# Patient Record
Sex: Female | Born: 1955 | Race: Black or African American | Hispanic: No | Smoking: Former smoker
Health system: Southern US, Community
[De-identification: ages and names within clinical notes are randomized; demographics above are authoritative.]

## PROBLEM LIST (undated history)

## (undated) DIAGNOSIS — E119 Type 2 diabetes mellitus without complications: Secondary | ICD-10-CM

## (undated) DIAGNOSIS — I639 Cerebral infarction, unspecified: Secondary | ICD-10-CM

## (undated) DIAGNOSIS — I10 Essential (primary) hypertension: Secondary | ICD-10-CM

## (undated) DIAGNOSIS — F329 Major depressive disorder, single episode, unspecified: Secondary | ICD-10-CM

## (undated) DIAGNOSIS — T3111 Burns involving 10-19% of body surface with 10-19% third degree burns: Secondary | ICD-10-CM

## (undated) DIAGNOSIS — Z452 Encounter for adjustment and management of vascular access device: Secondary | ICD-10-CM

## (undated) DIAGNOSIS — R410 Disorientation, unspecified: Secondary | ICD-10-CM

---

## 2002-02-11 ENCOUNTER — Emergency Department (HOSPITAL_COMMUNITY): Admission: EM | Admit: 2002-02-11 | Discharge: 2002-02-11 | Payer: Self-pay

## 2012-10-13 ENCOUNTER — Ambulatory Visit: Payer: Self-pay | Admitting: Family Medicine

## 2013-05-31 HISTORY — PX: SKIN GRAFT: SHX250

## 2013-12-26 ENCOUNTER — Emergency Department: Payer: Self-pay | Admitting: Emergency Medicine

## 2013-12-26 LAB — CBC
HCT: 42.5 % (ref 35.0–47.0)
HGB: 14 g/dL (ref 12.0–16.0)
MCH: 29.6 pg (ref 26.0–34.0)
MCHC: 33 g/dL (ref 32.0–36.0)
MCV: 90 fL (ref 80–100)
PLATELETS: 216 10*3/uL (ref 150–440)
RBC: 4.75 10*6/uL (ref 3.80–5.20)
RDW: 14.4 % (ref 11.5–14.5)
WBC: 11.1 10*3/uL — ABNORMAL HIGH (ref 3.6–11.0)

## 2013-12-26 LAB — COMPREHENSIVE METABOLIC PANEL
ALK PHOS: 60 U/L
Albumin: 3.6 g/dL (ref 3.4–5.0)
Anion Gap: 7 (ref 7–16)
BILIRUBIN TOTAL: 0.5 mg/dL (ref 0.2–1.0)
BUN: 15 mg/dL (ref 7–18)
Calcium, Total: 9 mg/dL (ref 8.5–10.1)
Chloride: 106 mmol/L (ref 98–107)
Co2: 25 mmol/L (ref 21–32)
Creatinine: 1.12 mg/dL (ref 0.60–1.30)
EGFR (Non-African Amer.): 55 — ABNORMAL LOW
Glucose: 146 mg/dL — ABNORMAL HIGH (ref 65–99)
Osmolality: 279 (ref 275–301)
Potassium: 3.4 mmol/L — ABNORMAL LOW (ref 3.5–5.1)
SGOT(AST): 15 U/L (ref 15–37)
SGPT (ALT): 20 U/L
SODIUM: 138 mmol/L (ref 136–145)
Total Protein: 7.5 g/dL (ref 6.4–8.2)

## 2013-12-28 DIAGNOSIS — I10 Essential (primary) hypertension: Secondary | ICD-10-CM | POA: Diagnosis present

## 2013-12-28 DIAGNOSIS — T3110 Burns involving 10-19% of body surface with 0% to 9% third degree burns: Secondary | ICD-10-CM | POA: Insufficient documentation

## 2015-07-09 ENCOUNTER — Other Ambulatory Visit: Payer: Self-pay | Admitting: Cardiology

## 2015-07-09 DIAGNOSIS — R079 Chest pain, unspecified: Secondary | ICD-10-CM

## 2015-07-16 ENCOUNTER — Ambulatory Visit
Admission: RE | Admit: 2015-07-16 | Discharge: 2015-07-16 | Disposition: A | Discharge status: Home / Self Care | Payer: Medicare Other | Source: Ambulatory Visit | Attending: Cardiology | Admitting: Cardiology

## 2015-07-16 DIAGNOSIS — R079 Chest pain, unspecified: Secondary | ICD-10-CM

## 2015-07-18 ENCOUNTER — Other Ambulatory Visit: Payer: Self-pay | Admitting: Cardiology

## 2015-07-18 DIAGNOSIS — R0602 Shortness of breath: Secondary | ICD-10-CM

## 2015-07-30 ENCOUNTER — Ambulatory Visit
Admission: RE | Admit: 2015-07-30 | Discharge: 2015-07-30 | Disposition: A | Discharge status: Home / Self Care | Payer: Medicare Other | Source: Ambulatory Visit | Attending: Cardiology | Admitting: Cardiology

## 2015-07-30 ENCOUNTER — Encounter
Admission: RE | Admit: 2015-07-30 | Discharge: 2015-07-30 | Disposition: A | Discharge status: Home / Self Care | Payer: Medicare Other | Source: Ambulatory Visit | Attending: Cardiology | Admitting: Cardiology

## 2015-07-30 DIAGNOSIS — R0602 Shortness of breath: Secondary | ICD-10-CM

## 2015-07-30 DIAGNOSIS — R0789 Other chest pain: Secondary | ICD-10-CM | POA: Diagnosis not present

## 2015-07-30 MED ORDER — TECHNETIUM TC 99M SESTAMIBI - CARDIOLITE
13.5690 | Freq: Once | INTRAVENOUS | Status: AC | PRN
  Administered 2015-07-30: 10:00:00 13.569 via INTRAVENOUS

## 2015-07-30 MED ORDER — REGADENOSON 0.4 MG/5ML IV SOLN
0.4000 mg | Freq: Once | INTRAVENOUS | Status: AC
  Administered 2015-07-30: 0.4 mg via INTRAVENOUS

## 2015-07-30 MED ORDER — TECHNETIUM TC 99M SESTAMIBI - CARDIOLITE
29.6670 | Freq: Once | INTRAVENOUS | Status: AC | PRN
  Administered 2015-07-30: 11:00:00 29.667 via INTRAVENOUS

## 2015-07-30 NOTE — Progress Notes (Signed)
*  PRELIMINARY RESULTS* Echocardiogram 2D Echocardiogram has been performed.  Georgann Housekeeper Hege 07/30/2015, 11:39 AM

## 2015-07-31 LAB — NM MYOCAR MULTI W/SPECT W/WALL MOTION / EF
CHL CUP STRESS STAGE 1 HR: 66 {beats}/min
CHL CUP STRESS STAGE 1 SPEED: 0 mph
CHL CUP STRESS STAGE 2 GRADE: 0 %
CHL CUP STRESS STAGE 4 SPEED: 0 mph
CHL CUP STRESS STAGE 5 HR: 106 {beats}/min
CSEPPHR: 117 {beats}/min
Estimated workload: 1 METS
LV sys vol: 5 mL
LVDIAVOL: 15 mL
NUC STRESS TID: 0.61
Percent of predicted max HR: 71 %
SDS: 1
SRS: 2
SSS: 1
Stage 1 Grade: 0 %
Stage 2 HR: 66 {beats}/min
Stage 2 Speed: 0 mph
Stage 3 Grade: 0 %
Stage 3 HR: 117 {beats}/min
Stage 3 Speed: 0 mph
Stage 4 Grade: 0 %
Stage 4 HR: 113 {beats}/min
Stage 5 DBP: 58 mmHg
Stage 5 Grade: 0 %
Stage 5 SBP: 128 mmHg
Stage 5 Speed: 0 mph

## 2017-04-10 ENCOUNTER — Emergency Department: Payer: Medicare Other

## 2017-04-10 ENCOUNTER — Other Ambulatory Visit: Payer: Self-pay

## 2017-04-10 ENCOUNTER — Inpatient Hospital Stay
Admission: EM | Admit: 2017-04-10 | Discharge: 2017-04-14 | DRG: 871 | Disposition: A | Discharge status: Skilled Nursing Facility | Payer: Medicare Other | Attending: Internal Medicine | Admitting: Internal Medicine

## 2017-04-10 ENCOUNTER — Encounter: Payer: Self-pay | Admitting: Adult Health

## 2017-04-10 DIAGNOSIS — Z79899 Other long term (current) drug therapy: Secondary | ICD-10-CM | POA: Diagnosis not present

## 2017-04-10 DIAGNOSIS — R6520 Severe sepsis without septic shock: Secondary | ICD-10-CM | POA: Diagnosis not present

## 2017-04-10 DIAGNOSIS — Z7982 Long term (current) use of aspirin: Secondary | ICD-10-CM

## 2017-04-10 DIAGNOSIS — R6521 Severe sepsis with septic shock: Secondary | ICD-10-CM | POA: Diagnosis present

## 2017-04-10 DIAGNOSIS — F329 Major depressive disorder, single episode, unspecified: Secondary | ICD-10-CM | POA: Diagnosis present

## 2017-04-10 DIAGNOSIS — E872 Acidosis, unspecified: Secondary | ICD-10-CM

## 2017-04-10 DIAGNOSIS — E785 Hyperlipidemia, unspecified: Secondary | ICD-10-CM | POA: Diagnosis present

## 2017-04-10 DIAGNOSIS — I10 Essential (primary) hypertension: Secondary | ICD-10-CM | POA: Diagnosis present

## 2017-04-10 DIAGNOSIS — Z6837 Body mass index (BMI) 37.0-37.9, adult: Secondary | ICD-10-CM | POA: Diagnosis not present

## 2017-04-10 DIAGNOSIS — R5383 Other fatigue: Secondary | ICD-10-CM

## 2017-04-10 DIAGNOSIS — E119 Type 2 diabetes mellitus without complications: Secondary | ICD-10-CM | POA: Diagnosis present

## 2017-04-10 DIAGNOSIS — R7881 Bacteremia: Secondary | ICD-10-CM | POA: Diagnosis not present

## 2017-04-10 DIAGNOSIS — E669 Obesity, unspecified: Secondary | ICD-10-CM | POA: Diagnosis present

## 2017-04-10 DIAGNOSIS — G9341 Metabolic encephalopathy: Secondary | ICD-10-CM | POA: Diagnosis present

## 2017-04-10 DIAGNOSIS — N390 Urinary tract infection, site not specified: Secondary | ICD-10-CM

## 2017-04-10 DIAGNOSIS — N179 Acute kidney failure, unspecified: Secondary | ICD-10-CM | POA: Diagnosis present

## 2017-04-10 DIAGNOSIS — G40909 Epilepsy, unspecified, not intractable, without status epilepticus: Secondary | ICD-10-CM | POA: Diagnosis present

## 2017-04-10 DIAGNOSIS — Z8673 Personal history of transient ischemic attack (TIA), and cerebral infarction without residual deficits: Secondary | ICD-10-CM

## 2017-04-10 DIAGNOSIS — Z7984 Long term (current) use of oral hypoglycemic drugs: Secondary | ICD-10-CM | POA: Diagnosis not present

## 2017-04-10 DIAGNOSIS — A419 Sepsis, unspecified organism: Principal | ICD-10-CM | POA: Diagnosis present

## 2017-04-10 DIAGNOSIS — I2510 Atherosclerotic heart disease of native coronary artery without angina pectoris: Secondary | ICD-10-CM | POA: Diagnosis present

## 2017-04-10 HISTORY — DX: Cerebral infarction, unspecified: I63.9

## 2017-04-10 HISTORY — DX: Type 2 diabetes mellitus without complications: E11.9

## 2017-04-10 HISTORY — DX: Burns involving 10-19% of body surface with 10-19% third degree burns: T31.11

## 2017-04-10 HISTORY — DX: Disorientation, unspecified: R41.0

## 2017-04-10 HISTORY — DX: Essential (primary) hypertension: I10

## 2017-04-10 HISTORY — DX: Major depressive disorder, single episode, unspecified: F32.9

## 2017-04-10 LAB — PROTIME-INR
INR: 1.11
PROTHROMBIN TIME: 14.2 s (ref 11.4–15.2)

## 2017-04-10 LAB — URINALYSIS, ROUTINE W REFLEX MICROSCOPIC
Glucose, UA: NEGATIVE mg/dL
KETONES UR: NEGATIVE mg/dL
Nitrite: NEGATIVE
PROTEIN: 100 mg/dL — AB
Specific Gravity, Urine: 1.035 — ABNORMAL HIGH (ref 1.005–1.030)
pH: 5 (ref 5.0–8.0)

## 2017-04-10 LAB — GLUCOSE, CAPILLARY
GLUCOSE-CAPILLARY: 116 mg/dL — AB (ref 65–99)
Glucose-Capillary: 96 mg/dL (ref 65–99)
Glucose-Capillary: 97 mg/dL (ref 65–99)

## 2017-04-10 LAB — COMPREHENSIVE METABOLIC PANEL
ALBUMIN: 2.8 g/dL — AB (ref 3.5–5.0)
ALT: 22 U/L (ref 14–54)
ANION GAP: 8 (ref 5–15)
AST: 71 U/L — AB (ref 15–41)
Alkaline Phosphatase: 70 U/L (ref 38–126)
BILIRUBIN TOTAL: 2.6 mg/dL — AB (ref 0.3–1.2)
BUN: 22 mg/dL — AB (ref 6–20)
CHLORIDE: 113 mmol/L — AB (ref 101–111)
CO2: 25 mmol/L (ref 22–32)
Calcium: 8.9 mg/dL (ref 8.9–10.3)
Creatinine, Ser: 1.24 mg/dL — ABNORMAL HIGH (ref 0.44–1.00)
GFR calc Af Amer: 54 mL/min — ABNORMAL LOW (ref >60)
GFR calc non Af Amer: 47 mL/min — ABNORMAL LOW (ref >60)
GLUCOSE: 105 mg/dL — AB (ref 65–99)
POTASSIUM: 4.3 mmol/L (ref 3.5–5.1)
SODIUM: 146 mmol/L — AB (ref 135–145)
Total Protein: 8.8 g/dL — ABNORMAL HIGH (ref 6.5–8.1)

## 2017-04-10 LAB — CBC WITH DIFFERENTIAL/PLATELET
BAND NEUTROPHILS: 12 %
BASOS PCT: 0 %
Basophils Absolute: 0 10*3/uL (ref 0–0.1)
Blasts: 0 %
EOS PCT: 0 %
Eosinophils Absolute: 0 10*3/uL (ref 0–0.7)
HEMATOCRIT: 48.3 % — AB (ref 35.0–47.0)
HEMOGLOBIN: 15.5 g/dL (ref 12.0–16.0)
LYMPHS ABS: 7.9 10*3/uL — AB (ref 1.0–3.6)
LYMPHS PCT: 54 %
MCH: 32.4 pg (ref 26.0–34.0)
MCHC: 32.2 g/dL (ref 32.0–36.0)
MCV: 100.5 fL — ABNORMAL HIGH (ref 80.0–100.0)
METAMYELOCYTES PCT: 0 %
MONOS PCT: 13 %
Monocytes Absolute: 1.9 10*3/uL — ABNORMAL HIGH (ref 0.2–0.9)
Myelocytes: 0 %
NEUTROS ABS: 4.9 10*3/uL (ref 1.4–6.5)
Neutrophils Relative %: 21 %
OTHER: 0 %
Platelets: 239 10*3/uL (ref 150–440)
Promyelocytes Absolute: 0 %
RBC: 4.81 MIL/uL (ref 3.80–5.20)
RDW: 17 % — ABNORMAL HIGH (ref 11.5–14.5)
WBC: 14.7 10*3/uL — ABNORMAL HIGH (ref 3.6–11.0)
nRBC: 0 /100 WBC

## 2017-04-10 LAB — BLOOD GAS, VENOUS
Acid-base deficit: 10.6 mmol/L — ABNORMAL HIGH (ref 0.0–2.0)
Bicarbonate: 14.4 mmol/L — ABNORMAL LOW (ref 20.0–28.0)
O2 Saturation: 61.3 %
PATIENT TEMPERATURE: 37
PCO2 VEN: 28 mmHg — AB (ref 44.0–60.0)
PH VEN: 7.32 (ref 7.250–7.430)
PO2 VEN: 35 mmHg (ref 32.0–45.0)

## 2017-04-10 LAB — VALPROIC ACID LEVEL: Valproic Acid Lvl: 72 ug/mL (ref 50.0–100.0)

## 2017-04-10 LAB — LIPASE, BLOOD: LIPASE: 28 U/L (ref 11–51)

## 2017-04-10 LAB — TROPONIN I: Troponin I: <0.03 ng/mL (ref <0.03)

## 2017-04-10 LAB — TSH: TSH: 1.994 u[IU]/mL (ref 0.350–4.500)

## 2017-04-10 LAB — PROCALCITONIN: Procalcitonin: <0.1 ng/mL

## 2017-04-10 LAB — LACTIC ACID, PLASMA
LACTIC ACID, VENOUS: 2.8 mmol/L — AB (ref 0.5–1.9)
LACTIC ACID, VENOUS: 2.1 mmol/L — AB (ref 0.5–1.9)

## 2017-04-10 LAB — MRSA PCR SCREENING: MRSA by PCR: NEGATIVE

## 2017-04-10 MED ORDER — DULOXETINE HCL 20 MG PO CPEP
40.0000 mg | ORAL_CAPSULE | Freq: Every day | ORAL | Status: DC
Start: 2017-04-11 — End: 2017-04-14
  Administered 2017-04-11 – 2017-04-13 (×3): 40 mg via ORAL
  Filled 2017-04-10 (×4): qty 2

## 2017-04-10 MED ORDER — IPRATROPIUM-ALBUTEROL 0.5-2.5 (3) MG/3ML IN SOLN
3.0000 mL | RESPIRATORY_TRACT | Status: DC
  Administered 2017-04-10 – 2017-04-11 (×6): 3 mL via RESPIRATORY_TRACT
  Filled 2017-04-10 (×5): qty 3
  Filled 2017-04-10: qty 42

## 2017-04-10 MED ORDER — SODIUM CHLORIDE 0.9 % IV BOLUS (SEPSIS)
1000.0000 mL | Freq: Once | INTRAVENOUS | Status: AC
  Administered 2017-04-10: 1000 mL via INTRAVENOUS

## 2017-04-10 MED ORDER — ORAL CARE MOUTH RINSE
15.0000 mL | Freq: Two times a day (BID) | OROMUCOSAL | Status: DC
  Administered 2017-04-11 – 2017-04-12 (×4): 15 mL via OROMUCOSAL

## 2017-04-10 MED ORDER — ONDANSETRON HCL 4 MG/2ML IJ SOLN: 4.0000 mg | Freq: Four times a day (QID) | INTRAMUSCULAR | Status: DC | PRN

## 2017-04-10 MED ORDER — MAGNESIUM HYDROXIDE 400 MG/5ML PO SUSP
30.0000 mL | Freq: Every day | ORAL | Status: DC | PRN
  Filled 2017-04-10: qty 30

## 2017-04-10 MED ORDER — SODIUM CHLORIDE 0.9 % IV SOLN
INTRAVENOUS | Status: DC
  Administered 2017-04-10 – 2017-04-11 (×2): via INTRAVENOUS

## 2017-04-10 MED ORDER — ADULT MULTIVITAMIN W/MINERALS CH
1.0000 | ORAL_TABLET | Freq: Every day | ORAL | Status: DC
  Administered 2017-04-11 – 2017-04-14 (×4): 1 via ORAL
  Filled 2017-04-10 (×4): qty 1

## 2017-04-10 MED ORDER — VANCOMYCIN HCL IN DEXTROSE 1-5 GM/200ML-% IV SOLN
1000.0000 mg | Freq: Once | INTRAVENOUS | Status: AC
  Administered 2017-04-10: 1000 mg via INTRAVENOUS
  Filled 2017-04-10: qty 200

## 2017-04-10 MED ORDER — ACETAMINOPHEN 325 MG PO TABS: 650.0000 mg | ORAL_TABLET | Freq: Four times a day (QID) | ORAL | Status: DC | PRN

## 2017-04-10 MED ORDER — ISOSORBIDE MONONITRATE ER 30 MG PO TB24
30.0000 mg | ORAL_TABLET | Freq: Every day | ORAL | Status: DC
  Administered 2017-04-12 – 2017-04-13 (×2): 30 mg via ORAL
  Filled 2017-04-10 (×4): qty 1

## 2017-04-10 MED ORDER — GABAPENTIN 300 MG PO CAPS
300.0000 mg | ORAL_CAPSULE | Freq: Three times a day (TID) | ORAL | Status: DC
  Administered 2017-04-11 – 2017-04-14 (×10): 300 mg via ORAL
  Filled 2017-04-10 (×11): qty 1

## 2017-04-10 MED ORDER — PIPERACILLIN-TAZOBACTAM 3.375 G IVPB
3.3750 g | Freq: Three times a day (TID) | INTRAVENOUS | Status: DC
  Administered 2017-04-10 – 2017-04-12 (×5): 3.375 g via INTRAVENOUS
  Filled 2017-04-10 (×5): qty 50

## 2017-04-10 MED ORDER — DIVALPROEX SODIUM 125 MG PO CSDR
375.0000 mg | DELAYED_RELEASE_CAPSULE | Freq: Two times a day (BID) | ORAL | Status: DC
  Administered 2017-04-11 – 2017-04-14 (×7): 375 mg via ORAL
  Filled 2017-04-10 (×8): qty 3

## 2017-04-10 MED ORDER — DOCUSATE SODIUM 100 MG PO CAPS
100.0000 mg | ORAL_CAPSULE | Freq: Two times a day (BID) | ORAL | Status: DC
Start: 2017-04-10 — End: 2017-04-14
  Administered 2017-04-11 – 2017-04-14 (×7): 100 mg via ORAL
  Filled 2017-04-10 (×7): qty 1

## 2017-04-10 MED ORDER — ACETAMINOPHEN 650 MG RE SUPP: 650.0000 mg | Freq: Four times a day (QID) | RECTAL | Status: DC | PRN

## 2017-04-10 MED ORDER — ASPIRIN EC 81 MG PO TBEC
81.0000 mg | DELAYED_RELEASE_TABLET | Freq: Every day | ORAL | Status: DC
  Administered 2017-04-11 – 2017-04-14 (×4): 81 mg via ORAL
  Filled 2017-04-10 (×4): qty 1

## 2017-04-10 MED ORDER — VALPROATE SODIUM 500 MG/5ML IV SOLN
1000.0000 mg | INTRAVENOUS | Status: AC
  Administered 2017-04-10: 1000 mg via INTRAVENOUS
  Filled 2017-04-10: qty 10

## 2017-04-10 MED ORDER — DIVALPROEX SODIUM 125 MG PO CSDR
250.0000 mg | DELAYED_RELEASE_CAPSULE | Freq: Every day | ORAL | Status: DC
  Administered 2017-04-11 – 2017-04-14 (×3): 250 mg via ORAL
  Filled 2017-04-10 (×4): qty 2

## 2017-04-10 MED ORDER — ENOXAPARIN SODIUM 40 MG/0.4ML ~~LOC~~ SOLN
40.0000 mg | SUBCUTANEOUS | Status: DC
  Administered 2017-04-10 – 2017-04-13 (×4): 40 mg via SUBCUTANEOUS
  Filled 2017-04-10 (×4): qty 0.4

## 2017-04-10 MED ORDER — LISINOPRIL 5 MG PO TABS
5.0000 mg | ORAL_TABLET | Freq: Every day | ORAL | Status: DC
  Administered 2017-04-12 – 2017-04-13 (×2): 5 mg via ORAL
  Filled 2017-04-10 (×3): qty 1

## 2017-04-10 MED ORDER — PRAVASTATIN SODIUM 20 MG PO TABS
20.0000 mg | ORAL_TABLET | Freq: Every day | ORAL | Status: DC
  Administered 2017-04-11 – 2017-04-13 (×3): 20 mg via ORAL
  Filled 2017-04-10 (×3): qty 1

## 2017-04-10 MED ORDER — VANCOMYCIN HCL 10 G IV SOLR
1250.0000 mg | Freq: Two times a day (BID) | INTRAVENOUS | Status: DC
  Administered 2017-04-10: 1250 mg via INTRAVENOUS
  Filled 2017-04-10 (×2): qty 1250

## 2017-04-10 MED ORDER — ONDANSETRON HCL 4 MG PO TABS: 4.0000 mg | ORAL_TABLET | Freq: Four times a day (QID) | ORAL | Status: DC | PRN

## 2017-04-10 MED ORDER — CHLORHEXIDINE GLUCONATE 0.12 % MT SOLN
15.0000 mL | Freq: Two times a day (BID) | OROMUCOSAL | Status: DC
  Administered 2017-04-10 – 2017-04-13 (×6): 15 mL via OROMUCOSAL
  Filled 2017-04-10 (×7): qty 15
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
## 2017-04-10 NOTE — ED Notes (Signed)
LAB COLLECTS DELAYED D/T difficult stick, ultrasound IV obtained, blood sent.

## 2017-04-10 NOTE — ED Triage Notes (Addendum)
Pt [facility] from [facility] d/t unresponsive with fever since 1000 today. snorous RR in route, 94% on room air, VSS, CBG 131, 101.3 axillary temp with EMS. Baseline dementia.
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
## 2017-04-10 NOTE — ED Notes (Signed)
Family at bedside. 

## 2017-04-10 NOTE — Progress Notes (Addendum)
Pharmacy Antibiotic Note  Kaitlyn Ruiz is a 61 y.o. female admitted on 04/10/2017 with unresponsiveness and febrile meeting 4/4 SIRS and is hypotensive. Pharmacy has been consulted for vanc/zosyn dosing.  Plan: Patient received vanc 1g IV x 1 in ED  Will continue w/ vanc 1.25g IV q12h w/ 6 hr stack dose. Will draw vanc trough 11/13 @ 1200 prior to 4th dose. Will start zosyn 3.375g IV q8h via extended infusion  Ke 0.0497 T1/2 14 ~ 12 hrs Css 21.06 mcg/mL Goal trough 15 - 20 mcg/mL  Height: 5\' 4"  (162.6 cm) Weight: 209 lb 7 oz (95 kg) IBW/kg (Calculated) : 54.7  Temp (24hrs), Avg:98.6 F (37 C), Min:97.4 F (36.3 C), Max:100.4 F (38 C)  Recent Labs  Lab 04/10/17 1801 04/10/17 2030  WBC 14.7*  --   CREATININE 1.24*  --   LATICACIDVEN 2.8* 2.1*    Estimated Creatinine Clearance: 54.6 mL/min (A) (by C-G formula based on SCr of 1.24 mg/dL (H)).    No Known Allergies   Thank you for allowing pharmacy to be a part of this patient's care.  Kaitlyn Ruiz, PharmD, BCPS Clinical Pharmacist 04/10/2017   11/13 0500 Lab called with GPC x1 bottle. CCM NP notified of result. No new recommendation.  Kaitlyn Ruiz, PharmD, BCPS  04/12/17 6:11 AM

## 2017-04-10 NOTE — ED Notes (Signed)
Date and time results received: 04/10/17 2110   Test: Lactic Acid  Critical Value: 2.1  Name of Provider Notified: Dr. Sheryle Hailiamond
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
## 2017-04-10 NOTE — ED Notes (Signed)
5 L of NS finished BP 90/59 HR 85 Dr. Fanny BienQuale made aware.

## 2017-04-10 NOTE — ED Notes (Signed)
Lab called to report lactic acid of 2.8. Primary nurse notified.

## 2017-04-11 ENCOUNTER — Encounter: Payer: Self-pay | Admitting: Adult Health

## 2017-04-11 DIAGNOSIS — R6521 Severe sepsis with septic shock: Secondary | ICD-10-CM

## 2017-04-11 DIAGNOSIS — G9341 Metabolic encephalopathy: Secondary | ICD-10-CM

## 2017-04-11 DIAGNOSIS — A419 Sepsis, unspecified organism: Principal | ICD-10-CM

## 2017-04-11 LAB — BASIC METABOLIC PANEL
ANION GAP: 6 (ref 5–15)
BUN: 16 mg/dL (ref 6–20)
CHLORIDE: 123 mmol/L — AB (ref 101–111)
CO2: 18 mmol/L — AB (ref 22–32)
Calcium: 7.1 mg/dL — ABNORMAL LOW (ref 8.9–10.3)
Creatinine, Ser: 0.85 mg/dL (ref 0.44–1.00)
GFR calc non Af Amer: >60 mL/min (ref >60)
Glucose, Bld: 93 mg/dL (ref 65–99)
Potassium: 4.2 mmol/L (ref 3.5–5.1)
Sodium: 147 mmol/L — ABNORMAL HIGH (ref 135–145)

## 2017-04-11 LAB — CBC
HEMATOCRIT: 39.2 % (ref 35.0–47.0)
HEMOGLOBIN: 12.5 g/dL (ref 12.0–16.0)
MCH: 32.9 pg (ref 26.0–34.0)
MCHC: 31.9 g/dL — ABNORMAL LOW (ref 32.0–36.0)
MCV: 103.3 fL — ABNORMAL HIGH (ref 80.0–100.0)
Platelets: 140 10*3/uL — ABNORMAL LOW (ref 150–440)
RBC: 3.8 MIL/uL (ref 3.80–5.20)
RDW: 17.2 % — ABNORMAL HIGH (ref 11.5–14.5)
WBC: 10.8 10*3/uL (ref 3.6–11.0)

## 2017-04-11 LAB — GLUCOSE, CAPILLARY
Glucose-Capillary: 99 mg/dL (ref 65–99)
Glucose-Capillary: 89 mg/dL (ref 65–99)
Glucose-Capillary: 77 mg/dL (ref 65–99)
Glucose-Capillary: 73 mg/dL (ref 65–99)
Glucose-Capillary: 63 mg/dL — ABNORMAL LOW (ref 65–99)
Glucose-Capillary: 161 mg/dL — ABNORMAL HIGH (ref 65–99)
Glucose-Capillary: 104 mg/dL — ABNORMAL HIGH (ref 65–99)

## 2017-04-11 LAB — PHOSPHORUS
PHOSPHORUS: 3 mg/dL (ref 2.5–4.6)
PHOSPHORUS: 2.2 mg/dL — AB (ref 2.5–4.6)

## 2017-04-11 LAB — PROCALCITONIN: Procalcitonin: <0.1 ng/mL

## 2017-04-11 LAB — MAGNESIUM: Magnesium: 1.5 mg/dL — ABNORMAL LOW (ref 1.7–2.4)

## 2017-04-11 LAB — HEMOGLOBIN A1C
Hgb A1c MFr Bld: 5.9 % — ABNORMAL HIGH (ref 4.8–5.6)
Mean Plasma Glucose: 122.63 mg/dL

## 2017-04-11 MED ORDER — IPRATROPIUM-ALBUTEROL 0.5-2.5 (3) MG/3ML IN SOLN
3.0000 mL | Freq: Three times a day (TID) | RESPIRATORY_TRACT | Status: DC
  Administered 2017-04-12: 3 mL via RESPIRATORY_TRACT
  Filled 2017-04-11: qty 3

## 2017-04-11 MED ORDER — DEXTROSE 50 % IV SOLN
INTRAVENOUS | Status: AC
  Administered 2017-04-11: 50 mL via INTRAVENOUS
  Filled 2017-04-11: qty 50

## 2017-04-11 MED ORDER — SODIUM CHLORIDE 0.9 % IV BOLUS (SEPSIS)
1000.0000 mL | INTRAVENOUS | Status: DC | PRN
  Administered 2017-04-11: 1000 mL via INTRAVENOUS

## 2017-04-11 MED ORDER — MAGNESIUM SULFATE 2 GM/50ML IV SOLN
2.0000 g | Freq: Once | INTRAVENOUS | Status: AC
  Administered 2017-04-11: 2 g via INTRAVENOUS
  Filled 2017-04-11: qty 50

## 2017-04-11 MED ORDER — INSULIN ASPART 100 UNIT/ML ~~LOC~~ SOLN: 0.0000 [IU] | SUBCUTANEOUS | Status: DC

## 2017-04-11 MED ORDER — DEXTROSE 50 % IV SOLN
1.0000 | Freq: Once | INTRAVENOUS | Status: AC
  Administered 2017-04-11: 50 mL via INTRAVENOUS

## 2017-04-11 NOTE — Progress Notes (Signed)
Decreased to 1l 

## 2017-04-11 NOTE — Progress Notes (Signed)
[employer] - [place] [facility]   PATIENT NAME: Kaitlyn Ruiz    MR#:  [id number]  DATE OF BIRTH:  15-Jul-1955  SUBJECTIVE:  CHIEF COMPLAINT:   Chief Complaint  Patient presents with  . Code Sepsis     Sent from Group home with lethargy, noted to have sepsis due to UTi.   Pt have some underlying CVA disorder and not able to give ROS  REVIEW OF SYSTEMS:  ROS DUe to Underlying CVA and baseline condition, pt is not able to give ROS.  DRUG ALLERGIES:  No Known Allergies  VITALS:  Blood pressure (!) 67/45, pulse 77, temperature (!) 97.4 F (36.3 C), temperature source Oral, resp. rate (!) 21, height 5\' 4"  (1.626 m), weight 98.5 kg (217 lb 2.5 oz), SpO2 99 %.  PHYSICAL EXAMINATION:  GENERAL:  61 y.o.-year-old patient lying in the bed with no acute distress.  EYES: Pupils equal, round, reactive to light and accommodation. No scleral icterus. Extraocular muscles intact.  HEENT: Head atraumatic, normocephalic. Oropharynx and nasopharynx clear.  NECK:  Supple, no jugular venous distention. No thyroid enlargement, no tenderness.  LUNGS: Normal breath sounds bilaterally, no wheezing, rales,rhonchi or crepitation. No use of accessory muscles of respiration.  CARDIOVASCULAR: S1, S2 normal. No murmurs, rubs, or gallops.  ABDOMEN: Soft, nontender, nondistended. Bowel sounds present. No organomegaly or mass.  EXTREMITIES: No pedal edema, cyanosis, or clubbing.  NEUROLOGIC: Some facial weakness, does not understand and follow commands, does not move limbs. Opens eyes to stimuli.  PSYCHIATRIC: The patient is alert and oriented x 0.  SKIN: No obvious rash, lesion, or ulcer.   Physical Exam LABORATORY PANEL:   CBC Recent Labs  Lab 04/11/17 0404  WBC 10.8  HGB 12.5  HCT 39.2  PLT 140*   ------------------------------------------------------------------------------------------------------------------  Chemistries  Recent Labs  Lab 04/10/17 1801 04/11/17 0404  NA  146* 147*  K 4.3 4.2  CL 113* 123*  CO2 25 18*  GLUCOSE 105* 93  BUN 22* 16  CREATININE 1.24* 0.85  CALCIUM 8.9 7.1*  MG  --  1.5*  AST 71*  --   ALT 22  --   ALKPHOS 70  --   BILITOT 2.6*  --    ------------------------------------------------------------------------------------------------------------------  Cardiac Enzymes Recent Labs  Lab 04/10/17 1801  TROPONINI <0.03   ------------------------------------------------------------------------------------------------------------------  RADIOLOGY:  Dg Chest Port 1 View  Result Date: 04/10/2017 CLINICAL DATA:  Unresponsive with fever today. EXAM: PORTABLE CHEST 1 VIEW COMPARISON:  None. FINDINGS: Lungs are adequately inflated with minimal linear atelectasis/scarring left base. No lobar consolidation or effusion. Cardiomediastinal silhouette is normal. Minimal degenerate change of the spine. IMPRESSION: No acute cardiopulmonary disease. Minimal linear atelectasis/ scarring left base. Electronically Signed   By: Elberta Fortisaniel  Boyle M.D.   On: 04/10/2017 18:29    ASSESSMENT AND PLAN:   Active Problems:   Septic shock (HCC)   Acute metabolic encephalopathy  1.  Sepsis: With shock; UTI   Cx sent, follow.  Continue vancomycin.  Add Zosyn   IV fluids. 2.  Seizure disorder:   Valproic acid level is in range.    Cont oral. 3.  AKI: Prerenal; continue to hydrate with intravenous fluid.  Lactic acid is decreasing.    Improved. 4.  Pyuria: Rare bacteria but leukocytes too numerous to count.      Follow cx results. Cont Abx. 5.  CAD: Stable; continue aspirin , Hold Imdur and lisinopril for now due to sepsis. 6.  Hypertension: The patient has low  blood pressure at this time.  Resume lisinopril when blood pressure can tolerate 7.  Hyperlipidemia: Continue statin therapy 8.  DVT prophylaxis: Lovenox 9.  GI prophylaxis: None     All the records are reviewed and case discussed with Care Management/Social Workerr. Management plans  discussed with the patient, family and they are in agreement.  CODE STATUS: FUll.  TOTAL TIME TAKING CARE OF THIS PATIENT: 35 minutes.   POSSIBLE D/C IN 1-2 DAYS, DEPENDING ON CLINICAL CONDITION.   Altamese DillingVACHHANI, Yarethzi Branan M.D on 04/11/2017   Between 7am to 6pm - Pager - [phone]  After 6pm go to [email or web address] - password [email or web address]  [facility] [place] [facility]  Office  [phone]  CC: Primary care physician; Patient, No Pcp Per  Note: This dictation was prepared with Dragon dictation along with smaller phrase technology. Any transcriptional errors that result from this process are unintentional.

## 2017-04-12 ENCOUNTER — Encounter: Payer: Self-pay | Admitting: *Deleted

## 2017-04-12 DIAGNOSIS — R7881 Bacteremia: Secondary | ICD-10-CM

## 2017-04-12 DIAGNOSIS — R6520 Severe sepsis without septic shock: Secondary | ICD-10-CM

## 2017-04-12 LAB — BASIC METABOLIC PANEL
Anion gap: 2 — ABNORMAL LOW (ref 5–15)
BUN: 9 mg/dL (ref 6–20)
CO2: 21 mmol/L — ABNORMAL LOW (ref 22–32)
Calcium: 7.5 mg/dL — ABNORMAL LOW (ref 8.9–10.3)
Chloride: 123 mmol/L — ABNORMAL HIGH (ref 101–111)
Creatinine, Ser: 0.78 mg/dL (ref 0.44–1.00)
GFR calc Af Amer: >60 mL/min (ref >60)
GFR calc non Af Amer: >60 mL/min (ref >60)
Glucose, Bld: 97 mg/dL (ref 65–99)
Potassium: 4 mmol/L (ref 3.5–5.1)
Sodium: 146 mmol/L — ABNORMAL HIGH (ref 135–145)

## 2017-04-12 LAB — URINE CULTURE: CULTURE: NO GROWTH

## 2017-04-12 LAB — GLUCOSE, CAPILLARY
Glucose-Capillary: 106 mg/dL — ABNORMAL HIGH (ref 65–99)
Glucose-Capillary: 106 mg/dL — ABNORMAL HIGH (ref 65–99)
Glucose-Capillary: 117 mg/dL — ABNORMAL HIGH (ref 65–99)
Glucose-Capillary: 65 mg/dL (ref 65–99)
Glucose-Capillary: 73 mg/dL (ref 65–99)
Glucose-Capillary: 78 mg/dL (ref 65–99)
Glucose-Capillary: 82 mg/dL (ref 65–99)

## 2017-04-12 LAB — BLOOD CULTURE ID PANEL (REFLEXED)
Acinetobacter baumannii: NOT DETECTED
CANDIDA PARAPSILOSIS: NOT DETECTED
CANDIDA TROPICALIS: NOT DETECTED
Candida albicans: NOT DETECTED
Candida glabrata: NOT DETECTED
Candida krusei: NOT DETECTED
ENTEROBACTERIACEAE SPECIES: NOT DETECTED
Enterobacter cloacae complex: NOT DETECTED
Enterococcus species: NOT DETECTED
Escherichia coli: NOT DETECTED
HAEMOPHILUS INFLUENZAE: NOT DETECTED
KLEBSIELLA OXYTOCA: NOT DETECTED
KLEBSIELLA PNEUMONIAE: NOT DETECTED
Listeria monocytogenes: NOT DETECTED
NEISSERIA MENINGITIDIS: NOT DETECTED
PROTEUS SPECIES: NOT DETECTED
Pseudomonas aeruginosa: NOT DETECTED
SERRATIA MARCESCENS: NOT DETECTED
STAPHYLOCOCCUS AUREUS BCID: NOT DETECTED
STAPHYLOCOCCUS SPECIES: NOT DETECTED
STREPTOCOCCUS AGALACTIAE: NOT DETECTED
STREPTOCOCCUS SPECIES: NOT DETECTED
Streptococcus pneumoniae: NOT DETECTED
Streptococcus pyogenes: NOT DETECTED

## 2017-04-12 LAB — PROCALCITONIN: Procalcitonin: <0.1 ng/mL

## 2017-04-12 LAB — MAGNESIUM: MAGNESIUM: 2 mg/dL (ref 1.7–2.4)

## 2017-04-12 LAB — LACTIC ACID, PLASMA: Lactic Acid, Venous: 2.7 mmol/L (ref 0.5–1.9)

## 2017-04-12 MED ORDER — VANCOMYCIN HCL IN DEXTROSE 1-5 GM/200ML-% IV SOLN
1000.0000 mg | Freq: Once | INTRAVENOUS | Status: AC
  Administered 2017-04-12: 1000 mg via INTRAVENOUS
  Filled 2017-04-12: qty 200

## 2017-04-12 MED ORDER — DEXTROSE 5 % IV SOLN
INTRAVENOUS | Status: DC
  Administered 2017-04-12 – 2017-04-14 (×3): via INTRAVENOUS

## 2017-04-12 MED ORDER — VANCOMYCIN HCL IN DEXTROSE 1-5 GM/200ML-% IV SOLN
1000.0000 mg | Freq: Two times a day (BID) | INTRAVENOUS | Status: DC
  Administered 2017-04-13: 1000 mg via INTRAVENOUS
  Filled 2017-04-12 (×2): qty 200

## 2017-04-12 MED ORDER — INSULIN ASPART 100 UNIT/ML ~~LOC~~ SOLN: 0.0000 [IU] | Freq: Three times a day (TID) | SUBCUTANEOUS | Status: DC

## 2017-04-12 MED ORDER — SODIUM CHLORIDE 0.9 % IV BOLUS (SEPSIS)
1000.0000 mL | Freq: Once | INTRAVENOUS | Status: AC
  Administered 2017-04-13: 1000 mL via INTRAVENOUS

## 2017-04-12 MED ORDER — DEXTROSE-NACL 5-0.9 % IV SOLN
INTRAVENOUS | Status: DC
  Administered 2017-04-12: 04:00:00 via INTRAVENOUS

## 2017-04-12 MED ORDER — IPRATROPIUM-ALBUTEROL 0.5-2.5 (3) MG/3ML IN SOLN: 3.0000 mL | RESPIRATORY_TRACT | Status: DC | PRN

## 2017-04-12 NOTE — Progress Notes (Signed)
NAD No new complaints  Vitals:   04/12/17 0900 04/12/17 0918 04/12/17 1000 04/12/17 1100  BP:  (!) 102/58 109/60 (!) 81/61  Pulse: 70 75 64 79  Resp: (!) 7 14 11 14   Temp:      TempSrc:      SpO2: 100% 99% 100% 91%  Weight:      Height:        NAD, tremulous HEENT WNL JVP not visualized Scattered bilateral rhonchi Regular, no M Obese, NABS Extremities warm, no edema Diffusely very weak without focality  BMP Latest Ref Rng & Units 04/12/2017 04/11/2017 04/10/2017  Glucose 65 - 99 mg/dL 97 93 621(H105(H)  BUN 6 - 20 mg/dL 9 16 08(M22(H)  Creatinine 0.44 - 1.00 mg/dL 5.780.78 4.690.85 6.29(B1.24(H)  Sodium 135 - 145 mmol/L 146(H) 147(H) 146(H)  Potassium 3.5 - 5.1 mmol/L 4.0 4.2 4.3  Chloride 101 - 111 mmol/L 123(H) 123(H) 113(H)  CO2 22 - 32 mmol/L 21(L) 18(L) 25  Calcium 8.9 - 10.3 mg/dL 7.5(L) 7.1(L) 8.9    CBC Latest Ref Rng & Units 04/11/2017 04/10/2017 12/26/2013  WBC 3.6 - 11.0 K/uL 10.8 14.7(H) 11.1(H)  Hemoglobin 12.0 - 16.0 g/dL 28.412.5 13.215.5 44.014.0  Hematocrit 35.0 - 47.0 % 39.2 48.3(H) 42.5  Platelets 150 - 440 K/uL 140(L) 239 216   Results for orders placed or performed during the hospital encounter of 04/10/17  Blood Culture (routine x 2)     Status: None (Preliminary result)   Collection Time: 04/10/17  6:01 PM  Result Value Ref Range Status   Specimen Description BLOOD BLOOD LEFT ARM  Final   Special Requests   Final    BOTTLES DRAWN AEROBIC AND ANAEROBIC Blood Culture adequate volume   Culture NO GROWTH 2 DAYS  Final   Report Status PENDING  Incomplete  Blood Culture (routine x 2)     Status: None (Preliminary result)   Collection Time: 04/10/17  6:01 PM  Result Value Ref Range Status   Specimen Description BLOOD BLOOD LEFT ARM  Final   Special Requests   Final    BOTTLES DRAWN AEROBIC AND ANAEROBIC Blood Culture adequate volume   Culture  Setup Time   Final    GRAM POSITIVE COCCI AEROBIC BOTTLE ONLY CRITICAL RESULT CALLED TO, READ BACK BY AND VERIFIED WITH: MATT MCBANE ON  04/12/17 AT 0558 JAG    Culture GRAM POSITIVE COCCI  Final   Report Status PENDING  Incomplete  Urine Culture     Status: None   Collection Time: 04/10/17  6:01 PM  Result Value Ref Range Status   Specimen Description URINE, RANDOM  Final   Special Requests NONE  Final   Culture   Final    NO GROWTH Performed at [facility], [address] [place], [place] [address]    Report Status 04/12/2017 FINAL  Final  Blood Culture ID Panel (Reflexed)     Status: None   Collection Time: 04/10/17  6:01 PM  Result Value Ref Range Status   Enterococcus species NOT DETECTED NOT DETECTED Final   Listeria monocytogenes NOT DETECTED NOT DETECTED Final   Staphylococcus species NOT DETECTED NOT DETECTED Final   Staphylococcus aureus NOT DETECTED NOT DETECTED Final   Streptococcus species NOT DETECTED NOT DETECTED Final   Streptococcus agalactiae NOT DETECTED NOT DETECTED Final   Streptococcus pneumoniae NOT DETECTED NOT DETECTED Final   Streptococcus pyogenes NOT DETECTED NOT DETECTED Final   Acinetobacter baumannii NOT DETECTED NOT DETECTED Final  Enterobacteriaceae species NOT DETECTED NOT DETECTED Final   Enterobacter cloacae complex NOT DETECTED NOT DETECTED Final   Escherichia coli NOT DETECTED NOT DETECTED Final   Klebsiella oxytoca NOT DETECTED NOT DETECTED Final   Klebsiella pneumoniae NOT DETECTED NOT DETECTED Final   Proteus species NOT DETECTED NOT DETECTED Final   Serratia marcescens NOT DETECTED NOT DETECTED Final   Haemophilus influenzae NOT DETECTED NOT DETECTED Final   Neisseria meningitidis NOT DETECTED NOT DETECTED Final   Pseudomonas aeruginosa NOT DETECTED NOT DETECTED Final   Candida albicans NOT DETECTED NOT DETECTED Final   Candida glabrata NOT DETECTED NOT DETECTED Final   Candida krusei NOT DETECTED NOT DETECTED Final   Candida parapsilosis NOT DETECTED NOT DETECTED Final   Candida tropicalis NOT DETECTED NOT DETECTED Final  MRSA PCR Screening     Status: None    Collection Time: 04/10/17 10:31 PM  Result Value Ref Range Status   MRSA by PCR NEGATIVE NEGATIVE Final    Comment:        The GeneXpert MRSA Assay (FDA approved for NASAL specimens only), is one component of a comprehensive MRSA colonization surveillance program. It is not intended to diagnose MRSA infection nor to guide or monitor treatment for MRSA infections.    Anti-infectives (From admission, onward)   Start     Dose/Rate Route Frequency Ordered Stop   04/12/17 2300  vancomycin (VANCOCIN) IVPB 1000 mg/200 mL premix     1,000 mg 200 mL/hr over 60 Minutes Intravenous Every 12 hours 04/12/17 1150     04/12/17 1030  vancomycin (VANCOCIN) IVPB 1000 mg/200 mL premix     1,000 mg 200 mL/hr over 60 Minutes Intravenous  Once 04/12/17 1017 04/12/17 1225   04/11/17 0100  vancomycin (VANCOCIN) 1,250 mg in sodium chloride 0.9 % 250 mL IVPB  Status:  Discontinued     1,250 mg 166.7 mL/hr over 90 Minutes Intravenous Every 12 hours 04/10/17 2326 04/11/17 0626   04/10/17 2330  piperacillin-tazobactam (ZOSYN) IVPB 3.375 g  Status:  Discontinued     3.375 g 12.5 mL/hr over 240 Minutes Intravenous Every 8 hours 04/10/17 2326 04/12/17 0851   04/10/17 1745  vancomycin (VANCOCIN) IVPB 1000 mg/200 mL premix     1,000 mg 200 mL/hr over 60 Minutes Intravenous  Once 04/10/17 1740 04/10/17 1947      No new chest x-ray  Impression: Chronically ill SNF patient Presentation of fever and decreased LOC Presumptive diagnosis of severe sepsis -PCT negative x3 1/2 BC + GPC's - Reflex panel negative  Possible false positive Pyuria with negative urine culture AKI, resolved Type II DM History of CVA  PLAN/REC: Transfer to MedSurg floor Vancomycin ordered -continue until final ID of organism in [facility]BC made Supplemental oxygen as needed Advance diet and activity -PT eval requested SSI adjusted to ACHS  After transfer, PCCM will sign off. Please call if we can be of further assistance  Billy Fischeravid  Chandni Gagan, MD PCCM service Mobile [phone] Pager [phone] 04/12/2017 12:53 PM

## 2017-04-12 NOTE — Progress Notes (Signed)
Pt oxygen  titrated to RA

## 2017-04-12 NOTE — Progress Notes (Signed)
[facility]. Patient transported to room 106 via hospital bed by Chasity, NT.

## 2017-04-12 NOTE — Progress Notes (Signed)
[employer] -  at [facility]   PATIENT NAME: Kaitlyn Ruiz    MR#:  [id number]  DATE OF BIRTH:  03/03/1956  SUBJECTIVE:  CHIEF COMPLAINT:   Chief Complaint  Patient presents with  . Code Sepsis     Sent from Group home with lethargy, noted to have sepsis due to UTi.   Pt have some underlying CVA disorder and not able to give ROS.  She is more alert today and talking. Follows commands and communicate.  REVIEW OF SYSTEMS:  ROS Due to Underlying CVA and baseline condition, pt is not able to give ROS.  DRUG ALLERGIES:  No Known Allergies  VITALS:  Blood pressure 95/62, pulse 74, temperature (!) 97.5 F (36.4 C), temperature source Oral, resp. rate 18, height 5\' 4"  (1.626 m), weight 99.7 kg (219 lb 11.2 oz), SpO2 100 %.  PHYSICAL EXAMINATION:  GENERAL:  61 y.o.-year-old patient lying in the bed with no acute distress.  EYES: Pupils equal, round, reactive to light and accommodation. No scleral icterus. Extraocular muscles intact.  HEENT: Head atraumatic, normocephalic. Oropharynx and nasopharynx clear.  NECK:  Supple, no jugular venous distention. No thyroid enlargement, no tenderness.  LUNGS: Normal breath sounds bilaterally, no wheezing, rales,rhonchi or crepitation. No use of accessory muscles of respiration.  CARDIOVASCULAR: S1, S2 normal. No murmurs, rubs, or gallops.  ABDOMEN: Soft, nontender, nondistended. Bowel sounds present. No organomegaly or mass.  EXTREMITIES: No pedal edema, cyanosis, or clubbing. Have multiple areas of skin graftings, NEUROLOGIC: Some facial weakness, follows commands, power Both upper limbs 3/5 and have both lower limbs 1/5, just moving toes slowly. PSYCHIATRIC: The patient is alert and oriented x 2.  SKIN: No obvious rash, lesion, or ulcer. Multiple Skin graft scars as above.  Physical Exam LABORATORY PANEL:   CBC Recent Labs  Lab 04/11/17 0404  WBC 10.8  HGB 12.5  HCT 39.2  PLT 140*    ------------------------------------------------------------------------------------------------------------------  Chemistries  Recent Labs  Lab 04/10/17 1801  04/12/17 0455  NA 146*   < > 146*  K 4.3   < > 4.0  CL 113*   < > 123*  CO2 25   < > 21*  GLUCOSE 105*   < > 97  BUN 22*   < > 9  CREATININE 1.24*   < > 0.78  CALCIUM 8.9   < > 7.5*  MG  --    < > 2.0  AST 71*  --   --   ALT 22  --   --   ALKPHOS 70  --   --   BILITOT 2.6*  --   --    < > = values in this interval not displayed.   ------------------------------------------------------------------------------------------------------------------  Cardiac Enzymes Recent Labs  Lab 04/10/17 1801  TROPONINI <0.03   ------------------------------------------------------------------------------------------------------------------  RADIOLOGY:  Dg Chest Port 1 View  Result Date: 04/10/2017 CLINICAL DATA:  Unresponsive with fever today. EXAM: PORTABLE CHEST 1 VIEW COMPARISON:  None. FINDINGS: Lungs are adequately inflated with minimal linear atelectasis/scarring left base. No lobar consolidation or effusion. Cardiomediastinal silhouette is normal. Minimal degenerate change of the spine. IMPRESSION: No acute cardiopulmonary disease. Minimal linear atelectasis/ scarring left base. Electronically Signed   By: Elberta Fortisaniel  Boyle M.D.   On: 04/10/2017 18:29    ASSESSMENT AND PLAN:   Active Problems:   Septic shock (HCC)   Acute metabolic encephalopathy  1.  Sepsis: With shock; UTI   Cx sent, follow.  Continue vancomycin.  Added Zosyn  IV fluids.   One blood cx positive for GPC, may be contamination, follow final results. 2.  Seizure disorder:   Valproic acid level is in range.    Cont oral. 3.  AKI: Prerenal; continue to hydrate with intravenous fluid.  Lactic acid is decreasing.    Improved. 4.  Pyuria: Rare bacteria but leukocytes too numerous to count.      Follow cx results. Cont Abx. 5.  CAD: Stable; continue  aspirin , Hold Imdur and lisinopril for now due to sepsis. 6.  Hypertension: The patient has low blood pressure at this time.  Resume lisinopril when blood pressure can tolerate 7.  Hyperlipidemia: Continue statin therapy 8.  DVT prophylaxis: Lovenox 9.  GI prophylaxis: None     All the records are reviewed and case discussed with Care Management/Social Workerr. Management plans discussed with the patient, family and they are in agreement.  CODE STATUS: FUll.  TOTAL TIME TAKING CARE OF THIS PATIENT: 35 minutes.   POSSIBLE D/C IN 1-2 DAYS, DEPENDING ON CLINICAL CONDITION.   Altamese DillingVACHHANI, Ohm Dentler M.D on 04/12/2017   Between 7am to 6pm - Pager - [phone]  After 6pm go to [email or web address] - password [email or web address]  [facility] [place] [facility]  Office  [phone]  CC: Primary care physician; Patient, No Pcp Per  Note: This dictation was prepared with Dragon dictation along with smaller phrase technology. Any transcriptional errors that result from this process are unintentional.

## 2017-04-12 NOTE — Progress Notes (Signed)
Noted that admission assessment has not been completed thus far during this admission. Assessment completed as thoroughly as possible in regards to patient's mental status.

## 2017-04-12 NOTE — Progress Notes (Signed)
Pharmacy Antibiotic Note  Kaitlyn CreedSharon A Ruiz is a 61 y.o. female admitted on 04/10/2017 with suspected bacteremia of Gram positive cocci (unknown species).  Pharmacy has been consulted for Vancomycin dosing.  Plan: Will give Vancomycin 1000mg  once and then initiate Vancomycin 1000mg  Q12H. Vancomycin trough will be drawn prior to fourth dose. Pharmacy will continue to monitor and adjust as needed.   Ke = 0.77 T1/2 = 9 hrs VD = 51.3 Cmin = 15 Adjusted BW = 73.26 kg Stacked dose = 6 hrs  Height: 5\' 4"  (162.6 cm) Weight: 222 lb 14.2 oz (101.1 kg) IBW/kg (Calculated) : 54.7  Temp (24hrs), Avg:97.7 F (36.5 C), Min:97.4 F (36.3 C), Max:97.9 F (36.6 C)  Recent Labs  Lab 04/10/17 1801 04/10/17 2030 04/11/17 0404 04/12/17 0455  WBC 14.7*  --  10.8  --   CREATININE 1.24*  --  0.85 0.78  LATICACIDVEN 2.8* 2.1*  --   --     Estimated Creatinine Clearance: 87.6 mL/min (by C-G formula based on SCr of 0.78 mg/dL).    No Known Allergies  Antimicrobials this admission: Vancomycin: 11/1 x 2 doses, 11/13 >> Zosyn: 11/ >> 11/13  Dose adjustments this admission:   Microbiology results: BCx:11/13 >> Gram positive cocci - no speciation UCx: 11/11 >> NGTD MRSA PCR: NEGATIVE  Thank you for allowing pharmacy to be a part of this patient's care.  Yolanda BonineHannah Lifsey, PharmD Pharmacy Resident 04/12/2017 11:16 AM

## 2017-04-12 NOTE — Clinical Social Work Note (Signed)
Clinical Social Work Assessment  Patient Details  Name: Kaitlyn Ruiz MRN: [id number] Date of Birth: 08-23-1955  Date of referral:  04/12/17               Reason for consult:  Facility Placement                Permission sought to share information with:    Permission granted to share information::     Name::        Agency::     Relationship::     Contact Information:     Housing/Transportation Living arrangements for the past 2 months:  [facility] Source of Information:  Other (Comment Required)(Sibling) Patient Interpreter Needed:    Criminal Activity/Legal Involvement Pertinent to Current Situation/Hospitalization:    Significant Relationships:  Siblings Lives with:  Facility Resident Do you feel safe going back to the place where you live?  Yes Need for family participation in patient care:  Yes (Comment)  Care giving concerns:  Patient is a long term resident at [facility].   Social Worker assessment / plan:  CSW contacted patient's sister: Ms. Bruna PotterBenbow: [phone]. Ms. Bruna PotterBenbow stated that patient has been at [facility] since 2010. She wishes for her to return but stated that she was asked to hold patient's bed and was she could not pay the bed hold. CSW contacted [place] at [facility] and patient can return when time. CSW has notified patient's sister.  Employment status:  Disabled (Comment on whether or not currently receiving Disability) Insurance information:  Managed Medicare PT Recommendations:    Information / Referral to community resources:     Patient/Family's Response to care:  Patient's sister expressed appreciation for CSW assistance.  Patient/Family's Understanding of and Emotional Response to Diagnosis, Current Treatment, and Prognosis:  Patient's sister is involved in patient's care.  Emotional Assessment Appearance:    Attitude/Demeanor/Rapport:    Affect (typically observed):    Orientation:  Oriented to  Self Alcohol / Substance use:  Not Applicable Psych involvement (Current and /or in the community):  No (Comment)  Discharge Needs  Concerns to be addressed:  Care Coordination Readmission within the last 30 days:  Yes Current discharge risk:  None Barriers to Discharge:  No Barriers Identified   York SpanielMonica Danel Requena, LCSW 04/12/2017, 11:58 AM

## 2017-04-12 NOTE — Evaluation (Signed)
Physical Therapy Evaluation Patient Details Name: Kaitlyn CreedSharon A Amodio MRN: [id number] DOB: 06-16-1955 Today's Date: 04/12/2017   History of Present Illness  presented to ER secondary to lethargy, AMS, hypotension; admitted with sepsis related to UTI, acute metabolic encephalopathy.  Clinical Impression  Upon evaluation, patient alert and oriented to self only; inconsistently follows simple commands, but demonstrates limited ability/willingness to participate with formal exercise program.  Bilat UE/LE generally weak and deconditioned (R > L) with moderate tremulousness of R UE during gross motor/functional tasks.  Dep for all mobility at this time.  Patient indicates that this is near baseline for her; confirmed by representative at facility (total care, hoyer lift for all mobility). No significant change in functional status or ability noted at this time; no acute PT needs identified.  Will benefit from continued assist with positioning, pressure relief from care team throughout remainder of hospitalization. Will sign-off at this point; please re-consult should need change.    Follow Up Recommendations No PT follow up    Equipment Recommendations       Recommendations for Other Services       Precautions / Restrictions Precautions Precautions: Fall Restrictions Weight Bearing Restrictions: No      Mobility  Bed Mobility Overal bed mobility: Needs Assistance Bed Mobility: Rolling Rolling: Total assist            Transfers                 General transfer comment: unsafe/unable  Ambulation/Gait             General Gait Details: unsafe/unable  Stairs            Wheelchair Mobility    Modified Rankin (Stroke Patients Only)       Balance                                             Pertinent Vitals/Pain Pain Assessment: No/denies pain    Home Living Family/patient expects to be discharged to:: Skilled nursing facility                       Prior Function Level of Independence: Needs assistance         Comments: Per representative of facility, patient is total care at baseline; utilizes hoyer lift for all transfers/attempts at OOB activities.  Assist from staff for feeding and ADLs.     Hand Dominance        Extremity/Trunk Assessment   Upper Extremity Assessment Upper Extremity Assessment: Generalized weakness(R UE grossly 3-/5 throughout, generally tremulous with gross motor/reaching tasks; L UE grossly 2+ to 3-/5)    Lower Extremity Assessment Lower Extremity Assessment: Generalized weakness(grossly 2+ to 3-/5 throughout bilat LEs; R ankle DF to neutral, L ankle DF lacking 5-8 degrees)       Communication      Cognition Arousal/Alertness: Awake/alert Behavior During Therapy: WFL for tasks assessed/performed Overall Cognitive Status: Difficult to assess                                 General Comments: oriented to self only; follows simple, one-step commands. Increased time for processing.      General Comments      Exercises Other Exercises Other Exercises: Supine bilat UE/LE therex, 1x8-10, act assist ROM  for strength/flexibility: shoulder flex/ext/abduct/adduct/IR/ER, elbow flex/ext, forearem pronation/supination, wrist flex/ext; bilat hip and knee flex/ext, ankle PF/DF.  Actively assists with 2-3 reps per extremity, but does not consistently actively participate with isolated therex.   Assessment/Plan    PT Assessment Patent does not need any further PT services  PT Problem List         PT Treatment Interventions      PT Goals (Current goals can be found in the Care Plan section)  Acute Rehab PT Goals PT Goal Formulation: All assessment and education complete, DC therapy    Frequency     Barriers to discharge        Co-evaluation               AM-PAC PT "6 Clicks" Daily Activity  Outcome Measure Difficulty turning over in bed (including  adjusting bedclothes, sheets and blankets)?: Unable Difficulty moving from lying on back to sitting on the side of the bed? : Unable Difficulty sitting down on and standing up from a chair with arms (e.g., wheelchair, bedside commode, etc,.)?: Unable Help needed moving to and from a bed to chair (including a wheelchair)?: Total Help needed walking in hospital room?: Total Help needed climbing 3-5 steps with a railing? : Total 6 Click Score: 6    End of Session   Activity Tolerance: Patient limited by fatigue Patient left: in bed;with call bell/phone within reach;with bed alarm set Nurse Communication: Mobility status PT Visit Diagnosis: Muscle weakness (generalized) (M62.81)    Time: [id number] PT Time Calculation (min) (ACUTE ONLY): 13 min   Charges:   PT Evaluation $PT Eval Low Complexity: 1 Low     PT G Codes:        Charnika Herbst H. Manson PasseyBrown, PT, DPT, NCS 04/12/17, 11:44 AM [phone]

## 2017-04-13 ENCOUNTER — Inpatient Hospital Stay: Payer: Medicare Other

## 2017-04-13 LAB — BLOOD GAS, ARTERIAL
ACID-BASE DEFICIT: 3.7 mmol/L — AB (ref 0.0–2.0)
Bicarbonate: 20.6 mmol/L (ref 20.0–28.0)
FIO2: 0.21
O2 Saturation: 94.5 %
PCO2 ART: 34 mmHg (ref 32.0–48.0)
PH ART: 7.39 (ref 7.350–7.450)
Patient temperature: 37
pO2, Arterial: 74 mmHg — ABNORMAL LOW (ref 83.0–108.0)

## 2017-04-13 LAB — CULTURE, BLOOD (ROUTINE X 2): SPECIAL REQUESTS: ADEQUATE

## 2017-04-13 LAB — BASIC METABOLIC PANEL
ANION GAP: 8 (ref 5–15)
Anion gap: 7 (ref 5–15)
BUN: 6 mg/dL (ref 6–20)
CALCIUM: 8.3 mg/dL — AB (ref 8.9–10.3)
CALCIUM: 7.8 mg/dL — AB (ref 8.9–10.3)
CO2: 20 mmol/L — AB (ref 22–32)
CO2: 15 mmol/L — AB (ref 22–32)
CREATININE: 0.66 mg/dL (ref 0.44–1.00)
Chloride: 115 mmol/L — ABNORMAL HIGH (ref 101–111)
Chloride: 117 mmol/L — ABNORMAL HIGH (ref 101–111)
Creatinine, Ser: 0.74 mg/dL (ref 0.44–1.00)
GFR calc Af Amer: >60 mL/min (ref >60)
GFR calc non Af Amer: >60 mL/min (ref >60)
GLUCOSE: 121 mg/dL — AB (ref 65–99)
Glucose, Bld: 123 mg/dL — ABNORMAL HIGH (ref 65–99)
Potassium: 3.5 mmol/L (ref 3.5–5.1)
Potassium: 3.8 mmol/L (ref 3.5–5.1)
Sodium: 142 mmol/L (ref 135–145)
Sodium: 140 mmol/L (ref 135–145)

## 2017-04-13 LAB — HEPATIC FUNCTION PANEL
ALBUMIN: 2.1 g/dL — AB (ref 3.5–5.0)
ALK PHOS: 69 U/L (ref 38–126)
ALT: 25 U/L (ref 14–54)
AST: 80 U/L — ABNORMAL HIGH (ref 15–41)
BILIRUBIN INDIRECT: 0.7 mg/dL (ref 0.3–0.9)
Bilirubin, Direct: 0.7 mg/dL — ABNORMAL HIGH (ref 0.1–0.5)
TOTAL PROTEIN: 6.2 g/dL — AB (ref 6.5–8.1)
Total Bilirubin: 1.4 mg/dL — ABNORMAL HIGH (ref 0.3–1.2)

## 2017-04-13 LAB — GLUCOSE, CAPILLARY
Glucose-Capillary: 88 mg/dL (ref 65–99)
Glucose-Capillary: 82 mg/dL (ref 65–99)
Glucose-Capillary: 90 mg/dL (ref 65–99)
Glucose-Capillary: 112 mg/dL — ABNORMAL HIGH (ref 65–99)

## 2017-04-13 LAB — LACTIC ACID, PLASMA
LACTIC ACID, VENOUS: 4.2 mmol/L — AB (ref 0.5–1.9)
LACTIC ACID, VENOUS: 3.1 mmol/L — AB (ref 0.5–1.9)
Lactic Acid, Venous: 4.1 mmol/L (ref 0.5–1.9)

## 2017-04-13 LAB — MAGNESIUM: Magnesium: 1.9 mg/dL (ref 1.7–2.4)

## 2017-04-13 MED ORDER — ENSURE ENLIVE PO LIQD
237.0000 mL | Freq: Two times a day (BID) | ORAL | Status: DC
  Administered 2017-04-14: 237 mL via ORAL

## 2017-04-13 MED ORDER — DEXTROSE 5 % IV BOLUS
500.0000 mL | Freq: Once | INTRAVENOUS | Status: AC
  Administered 2017-04-13: 500 mL via INTRAVENOUS

## 2017-04-13 MED ORDER — SODIUM CHLORIDE 0.9 % IV BOLUS (SEPSIS)
500.0000 mL | Freq: Once | INTRAVENOUS | Status: AC
  Administered 2017-04-13: 10:00:00 500 mL via INTRAVENOUS

## 2017-04-13 MED ORDER — DEXTROSE 5 % IV SOLN
2.0000 g | Freq: Once | INTRAVENOUS | Status: AC
  Administered 2017-04-13: 18:00:00 2 g via INTRAVENOUS
  Filled 2017-04-13: qty 2

## 2017-04-13 MED ORDER — DEXTROSE 5 % IV SOLN: 1.0000 g | Freq: Once | INTRAVENOUS | Status: DC

## 2017-04-13 MED ORDER — VANCOMYCIN HCL 10 G IV SOLR
1250.0000 mg | Freq: Two times a day (BID) | INTRAVENOUS | Status: DC
  Administered 2017-04-13 – 2017-04-14 (×2): 1250 mg via INTRAVENOUS
  Filled 2017-04-13 (×4): qty 1250

## 2017-04-13 NOTE — Progress Notes (Signed)
Pharmacy Antibiotic Note  Kaitlyn Ruiz is a 61 y.o. female admitted on 04/10/2017 with suspected bacteremia of Gram positive cocci (unknown species).  Pharmacy has been consulted for Vancomycin dosing.  Plan: 11/14 07:44 increase dose to vancomycin 1.25 gm IV Q12H, predicted trough 17 mcg/mL. Will check vancomycin level 04/14/17 at 13:30.   Ke = 0.77 T1/2 = 9 hrs VD = 51.3 Cmin = 15 Adjusted BW = 73.26 kg Stacked dose = 6 hrs  Height: 5\' 4"  (162.6 cm) Weight: 225 lb 9.6 oz (102.3 kg) IBW/kg (Calculated) : 54.7  Temp (24hrs), Avg:97.5 F (36.4 C), Min:97.3 F (36.3 C), Max:97.7 F (36.5 C)  Recent Labs  Lab 04/10/17 1801 04/10/17 2030 04/11/17 0404 04/12/17 0455 04/12/17 2258 04/13/17 0247 04/13/17 0620 04/13/17 0642  WBC 14.7*  --  10.8  --   --   --   --   --   CREATININE 1.24*  --  0.85 0.78  --   --  0.66  --   LATICACIDVEN 2.8* 2.1*  --   --  2.7* 4.2*  --  3.1*    Estimated Creatinine Clearance: 88.1 mL/min (by C-G formula based on SCr of 0.66 mg/dL).    No Known Allergies  Antimicrobials this admission: Vancomycin: 11/1 x 2 doses, 11/13 >> Zosyn: 11/ >> 11/13  Dose adjustments this admission:   Microbiology results: BCx:11/13 >> Gram positive cocci - no speciation UCx: 11/11 >> NGTD MRSA PCR: NEGATIVE  Thank you for allowing pharmacy to be a part of this patient's care.  Karmin Kasprzak A. Corinthookson, VermontPharm.D., BCPS Clinical Pharmacist 04/13/2017 7:44 AM

## 2017-04-13 NOTE — Progress Notes (Signed)
Received call from lab for a lactic acid of 4.2. MD notified. 5% Dextrose rate increased to 13100ml/hr per MD. Continue to monitor and recheck lactic acid in 3 hours per MD.
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
## 2017-04-13 NOTE — Progress Notes (Signed)
Patient BP low. Paged MD. 500ml Bolus of Dextrose 5%, adjust fluid rate to 15850ml/hr, discontinue Lisinopril per MD verbal order.

## 2017-04-13 NOTE — Progress Notes (Signed)
[facility] [place] [facility]   PATIENT NAME: Kaitlyn OleaSharon Rutan    MR#:  [id number]  DATE OF BIRTH:  [birth year]  SUBJECTIVE:  CHIEF COMPLAINT:   Chief Complaint  Patient presents with  . Code Sepsis     Sent from Group home with lethargy, noted to have sepsis due to UTi.   Pt have some underlying CVA disorder and not able to give ROS.  She is more alert today and talking. Follows commands and communicate.   Later in the afternoon she was more lethargic having some drooling of saliva.  REVIEW OF SYSTEMS:  ROS Due to Underlying CVA and baseline condition, pt is not able to give ROS.  DRUG ALLERGIES:  No Known Allergies  VITALS:  Blood pressure 108/63, pulse 78, temperature 98.4 F (36.9 C), temperature source Oral, resp. rate 18, height 5\' 4"  (1.626 m), weight 102.3 kg (225 lb 9.6 oz), SpO2 100 %.  PHYSICAL EXAMINATION:  GENERAL:  61 y.o.-year-old patient lying in the bed with no acute distress.  EYES: Pupils equal, round, reactive to light and accommodation. No scleral icterus. Extraocular muscles intact.  HEENT: Head atraumatic, normocephalic. Oropharynx and nasopharynx clear.  NECK:  Supple, no jugular venous distention. No thyroid enlargement, no tenderness.  LUNGS: Normal breath sounds bilaterally, no wheezing, rales,rhonchi or crepitation. No use of accessory muscles of respiration.  CARDIOVASCULAR: S1, S2 normal. No murmurs, rubs, or gallops.  ABDOMEN: Soft, nontender, nondistended. Bowel sounds present. No organomegaly or mass.  EXTREMITIES: No pedal edema, cyanosis, or clubbing. Have multiple areas of skin graftings, NEUROLOGIC: Some facial weakness, follows commands, power Both upper limbs 3/5 and have both lower limbs 1/5, just moving toes slowly. PSYCHIATRIC: The patient is alert and oriented x 2. Later in the afternoon when saw her she was lethargic but arousable and had drooling of saliva from mouth and did not appear to be any distress. SKIN: No  obvious rash, lesion, or ulcer. Multiple Skin graft scars as above.  Physical Exam LABORATORY PANEL:   CBC Recent Labs  Lab 04/11/17 0404  WBC 10.8  HGB 12.5  HCT 39.2  PLT 140*   ------------------------------------------------------------------------------------------------------------------  Chemistries  Recent Labs  Lab 04/10/17 1801  04/12/17 0455  04/13/17 1418  NA 146*   < > 146*   < > 140  K 4.3   < > 4.0   < > 3.8  CL 113*   < > 123*   < > 117*  CO2 25   < > 21*   < > 15*  GLUCOSE 105*   < > 97   < > 121*  BUN 22*   < > 9   < > <5*  CREATININE 1.24*   < > 0.78   < > 0.74  CALCIUM 8.9   < > 7.5*   < > 7.8*  MG  --    < > 2.0  --   --   AST 71*  --   --   --   --   ALT 22  --   --   --   --   ALKPHOS 70  --   --   --   --   BILITOT 2.6*  --   --   --   --    < > = values in this interval not displayed.   ------------------------------------------------------------------------------------------------------------------  Cardiac Enzymes Recent Labs  Lab 04/10/17 1801  TROPONINI <0.03   ------------------------------------------------------------------------------------------------------------------  RADIOLOGY:  Dg Chest  1 View  Result Date: 04/13/2017 CLINICAL DATA:  Lethargy. EXAM: CHEST 1 VIEW COMPARISON:  Radiograph of April 10, 2017. FINDINGS: The heart size and mediastinal contours are within normal limits. No pneumothorax or pleural effusion is noted. Mild bibasilar subsegmental atelectasis is noted. The visualized skeletal structures are unremarkable. IMPRESSION: Mild bibasilar subsegmental atelectasis. Electronically Signed   By: Lupita RaiderJames  Green Jr, M.D.   On: 04/13/2017 16:40    ASSESSMENT AND PLAN:   Active Problems:   Septic shock (HCC)   Acute metabolic encephalopathy  *  Sepsis: With shock; UTI   Cx sent, follow.  Continue vancomycin.  Added Zosyn   IV fluids.   One blood cx positive for GPC, may be contamination   Will give rocephin  for UTI. * metabolic acidosis, lactic acidosis   Unknown reason for now.   ABG done, not contributing.   Xray chest, no changes.   Vitals stable.   IV fluid bolus given , still some rise in lactic acid.   Get Xray KUB, though- she have good bower sounds.   Monitor for now with IV fluids, repeat labs in am.   She became more alert later, when her aunt came in to room. *  Seizure disorder:   Valproic acid level is in range.    Cont oral. *  AKI: Prerenal; continue to hydrate with intravenous fluid.      Improved. *  Pyuria: Rare bacteria but leukocytes too numerous to count.      Follow cx results. Cont Abx. *  CAD: Stable; continue aspirin , Hold Imdur and lisinopril for now due to sepsis. *  Hypertension: The patient has low blood pressure at this time.  Resume lisinopril when blood pressure can tolerate *  Hyperlipidemia: Continue statin therapy *  DVT prophylaxis: Lovenox *  GI prophylaxis: None   All the records are reviewed and case discussed with Care Management/Social Workerr. Management plans discussed with the patient, family and they are in agreement.  CODE STATUS: FUll.  TOTAL TIME TAKING CARE OF THIS PATIENT: 55 minutes.   POSSIBLE D/C IN 1-2 DAYS, DEPENDING ON CLINICAL CONDITION.   Altamese DillingVACHHANI, Decarlos Empey M.D on 04/13/2017   Between 7am to 6pm - Pager - [phone]  After 6pm go to [email or web address] - password [email or web address]  [facility] [place] [facility]  Office  [phone]  CC: Primary care physician; Patient, No Pcp Per  Note: This dictation was prepared with Dragon dictation along with smaller phrase technology. Any transcriptional errors that result from this process are unintentional.

## 2017-04-13 NOTE — Progress Notes (Signed)
 bolus of 0.9% Sodium chloride completed. Patient tolerated bolus well.

## 2017-04-13 NOTE — Progress Notes (Signed)
Received call from lab with critical lactic acid of 2.7. MD notified. Patient to receive bolus of 0.9% Sodium chloride. Will continue to monitor

## 2017-04-14 LAB — COMPREHENSIVE METABOLIC PANEL
ALK PHOS: 78 U/L (ref 38–126)
ALT: 25 U/L (ref 14–54)
ANION GAP: 3 — AB (ref 5–15)
AST: 72 U/L — ABNORMAL HIGH (ref 15–41)
Albumin: 2 g/dL — ABNORMAL LOW (ref 3.5–5.0)
BILIRUBIN TOTAL: 1.5 mg/dL — AB (ref 0.3–1.2)
BUN: <5 mg/dL — ABNORMAL LOW (ref 6–20)
CALCIUM: 7.9 mg/dL — AB (ref 8.9–10.3)
CO2: 23 mmol/L (ref 22–32)
Chloride: 112 mmol/L — ABNORMAL HIGH (ref 101–111)
Creatinine, Ser: 0.81 mg/dL (ref 0.44–1.00)
Glucose, Bld: 103 mg/dL — ABNORMAL HIGH (ref 65–99)
Potassium: 3.3 mmol/L — ABNORMAL LOW (ref 3.5–5.1)
SODIUM: 138 mmol/L (ref 135–145)
TOTAL PROTEIN: 6.6 g/dL (ref 6.5–8.1)

## 2017-04-14 LAB — CBC
HCT: 42.7 % (ref 35.0–47.0)
Hemoglobin: 13.6 g/dL (ref 12.0–16.0)
MCH: 31.9 pg (ref 26.0–34.0)
MCHC: 31.9 g/dL — ABNORMAL LOW (ref 32.0–36.0)
MCV: 99.9 fL (ref 80.0–100.0)
PLATELETS: 117 10*3/uL — AB (ref 150–440)
RBC: 4.27 MIL/uL (ref 3.80–5.20)
RDW: 16.2 % — ABNORMAL HIGH (ref 11.5–14.5)
WBC: 6.2 10*3/uL (ref 3.6–11.0)

## 2017-04-14 LAB — LACTIC ACID, PLASMA: LACTIC ACID, VENOUS: 2.2 mmol/L — AB (ref 0.5–1.9)

## 2017-04-14 LAB — GLUCOSE, CAPILLARY
Glucose-Capillary: 69 mg/dL (ref 65–99)
Glucose-Capillary: 104 mg/dL — ABNORMAL HIGH (ref 65–99)

## 2017-04-14 MED ORDER — CEPHALEXIN 250 MG PO CAPS
250.0000 mg | ORAL_CAPSULE | Freq: Three times a day (TID) | ORAL | 0 refills | Status: AC
Start: 2017-04-14 — End: 2017-04-17

## 2017-04-14 MED ORDER — ENSURE ENLIVE PO LIQD
237.0000 mL | Freq: Two times a day (BID) | ORAL | 12 refills | Status: DC
Start: 2017-04-14 — End: 2018-05-14

## 2017-04-14 NOTE — Care Management Important Message (Signed)
Important Message  Patient Details  Name: Kaitlyn Ruiz MRN: [id number] Date of Birth: 20-Nov-1955   Medicare Important Message Given:  Yes    Gwenette GreetBrenda S Gurley Climer, RN 04/14/2017, 10:38 AM

## 2017-04-14 NOTE — Progress Notes (Signed)
Pt discharged [facility]. Report called to accepting RN. IV's removed, pt on room air and in no distress. Otilio JeffersonMadelyn S Fenton, RN

## 2017-04-14 NOTE — Clinical Social Work Note (Addendum)
Patient to be d/c'ed today to [facility].  Patient and family agreeable to plans will transport via ems RN to call report to [phone].  CSW left message on patient's sister's voice mail.  Windell MouldingEric Myiah Petkus, MSW, Theresia MajorsLCSWA [phone]
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
## 2017-04-15 LAB — CULTURE, BLOOD (ROUTINE X 2)
CULTURE: NO GROWTH
Special Requests: ADEQUATE

## 2018-04-18 ENCOUNTER — Other Ambulatory Visit: Payer: Self-pay

## 2018-04-18 DIAGNOSIS — Z1231 Encounter for screening mammogram for malignant neoplasm of breast: Secondary | ICD-10-CM

## 2018-05-05 ENCOUNTER — Other Ambulatory Visit: Payer: Self-pay

## 2018-05-05 DIAGNOSIS — Z1231 Encounter for screening mammogram for malignant neoplasm of breast: Secondary | ICD-10-CM

## 2018-05-14 ENCOUNTER — Emergency Department: Payer: Medicare Other

## 2018-05-14 ENCOUNTER — Other Ambulatory Visit: Payer: Self-pay

## 2018-05-14 ENCOUNTER — Encounter: Payer: Self-pay | Admitting: Emergency Medicine

## 2018-05-14 ENCOUNTER — Inpatient Hospital Stay
Admission: EM | Admit: 2018-05-14 | Discharge: 2018-06-01 | DRG: 870 | Disposition: A | Discharge status: Skilled Nursing Facility | Payer: Medicare Other | Source: Skilled Nursing Facility | Attending: Internal Medicine | Admitting: Internal Medicine

## 2018-05-14 DIAGNOSIS — B9620 Unspecified Escherichia coli [E. coli] as the cause of diseases classified elsewhere: Secondary | ICD-10-CM | POA: Diagnosis present

## 2018-05-14 DIAGNOSIS — E1101 Type 2 diabetes mellitus with hyperosmolarity with coma: Secondary | ICD-10-CM

## 2018-05-14 DIAGNOSIS — G9341 Metabolic encephalopathy: Secondary | ICD-10-CM | POA: Diagnosis present

## 2018-05-14 DIAGNOSIS — D696 Thrombocytopenia, unspecified: Secondary | ICD-10-CM | POA: Diagnosis present

## 2018-05-14 DIAGNOSIS — E871 Hypo-osmolality and hyponatremia: Secondary | ICD-10-CM | POA: Diagnosis present

## 2018-05-14 DIAGNOSIS — R6521 Severe sepsis with septic shock: Secondary | ICD-10-CM | POA: Diagnosis present

## 2018-05-14 DIAGNOSIS — R1312 Dysphagia, oropharyngeal phase: Secondary | ICD-10-CM

## 2018-05-14 DIAGNOSIS — E1165 Type 2 diabetes mellitus with hyperglycemia: Secondary | ICD-10-CM | POA: Diagnosis present

## 2018-05-14 DIAGNOSIS — R1314 Dysphagia, pharyngoesophageal phase: Secondary | ICD-10-CM | POA: Diagnosis present

## 2018-05-14 DIAGNOSIS — E870 Hyperosmolality and hypernatremia: Secondary | ICD-10-CM | POA: Diagnosis present

## 2018-05-14 DIAGNOSIS — J189 Pneumonia, unspecified organism: Secondary | ICD-10-CM

## 2018-05-14 DIAGNOSIS — Z0189 Encounter for other specified special examinations: Secondary | ICD-10-CM

## 2018-05-14 DIAGNOSIS — Z8673 Personal history of transient ischemic attack (TIA), and cerebral infarction without residual deficits: Secondary | ICD-10-CM

## 2018-05-14 DIAGNOSIS — E860 Dehydration: Secondary | ICD-10-CM | POA: Diagnosis present

## 2018-05-14 DIAGNOSIS — E876 Hypokalemia: Secondary | ICD-10-CM | POA: Diagnosis present

## 2018-05-14 DIAGNOSIS — Z978 Presence of other specified devices: Secondary | ICD-10-CM

## 2018-05-14 DIAGNOSIS — I10 Essential (primary) hypertension: Secondary | ICD-10-CM | POA: Diagnosis present

## 2018-05-14 DIAGNOSIS — E872 Acidosis: Secondary | ICD-10-CM | POA: Diagnosis present

## 2018-05-14 DIAGNOSIS — Z4659 Encounter for fitting and adjustment of other gastrointestinal appliance and device: Secondary | ICD-10-CM

## 2018-05-14 DIAGNOSIS — J9601 Acute respiratory failure with hypoxia: Secondary | ICD-10-CM | POA: Diagnosis present

## 2018-05-14 DIAGNOSIS — R1310 Dysphagia, unspecified: Secondary | ICD-10-CM

## 2018-05-14 DIAGNOSIS — E11649 Type 2 diabetes mellitus with hypoglycemia without coma: Secondary | ICD-10-CM | POA: Diagnosis present

## 2018-05-14 DIAGNOSIS — Z6839 Body mass index (BMI) 39.0-39.9, adult: Secondary | ICD-10-CM | POA: Diagnosis not present

## 2018-05-14 DIAGNOSIS — J9600 Acute respiratory failure, unspecified whether with hypoxia or hypercapnia: Secondary | ICD-10-CM

## 2018-05-14 DIAGNOSIS — Z931 Gastrostomy status: Secondary | ICD-10-CM | POA: Diagnosis not present

## 2018-05-14 DIAGNOSIS — J9621 Acute and chronic respiratory failure with hypoxia: Secondary | ICD-10-CM | POA: Diagnosis not present

## 2018-05-14 DIAGNOSIS — R1319 Other dysphagia: Secondary | ICD-10-CM

## 2018-05-14 DIAGNOSIS — J181 Lobar pneumonia, unspecified organism: Secondary | ICD-10-CM | POA: Diagnosis present

## 2018-05-14 DIAGNOSIS — Z7982 Long term (current) use of aspirin: Secondary | ICD-10-CM

## 2018-05-14 DIAGNOSIS — Z7189 Other specified counseling: Secondary | ICD-10-CM | POA: Diagnosis not present

## 2018-05-14 DIAGNOSIS — Z79899 Other long term (current) drug therapy: Secondary | ICD-10-CM

## 2018-05-14 DIAGNOSIS — J9602 Acute respiratory failure with hypercapnia: Secondary | ICD-10-CM | POA: Diagnosis present

## 2018-05-14 DIAGNOSIS — J9589 Other postprocedural complications and disorders of respiratory system, not elsewhere classified: Secondary | ICD-10-CM | POA: Diagnosis not present

## 2018-05-14 DIAGNOSIS — G9349 Other encephalopathy: Secondary | ICD-10-CM | POA: Diagnosis not present

## 2018-05-14 DIAGNOSIS — R2990 Unspecified symptoms and signs involving the nervous system: Secondary | ICD-10-CM

## 2018-05-14 DIAGNOSIS — Z515 Encounter for palliative care: Secondary | ICD-10-CM

## 2018-05-14 DIAGNOSIS — I959 Hypotension, unspecified: Secondary | ICD-10-CM | POA: Diagnosis present

## 2018-05-14 DIAGNOSIS — J9690 Respiratory failure, unspecified, unspecified whether with hypoxia or hypercapnia: Secondary | ICD-10-CM

## 2018-05-14 DIAGNOSIS — N390 Urinary tract infection, site not specified: Secondary | ICD-10-CM | POA: Diagnosis present

## 2018-05-14 DIAGNOSIS — F329 Major depressive disorder, single episode, unspecified: Secondary | ICD-10-CM | POA: Diagnosis present

## 2018-05-14 DIAGNOSIS — A419 Sepsis, unspecified organism: Secondary | ICD-10-CM | POA: Diagnosis present

## 2018-05-14 DIAGNOSIS — J9622 Acute and chronic respiratory failure with hypercapnia: Secondary | ICD-10-CM | POA: Diagnosis not present

## 2018-05-14 DIAGNOSIS — Z87891 Personal history of nicotine dependence: Secondary | ICD-10-CM

## 2018-05-14 LAB — COMPREHENSIVE METABOLIC PANEL
ALBUMIN: 2.1 g/dL — AB (ref 3.5–5.0)
ALT: 23 U/L (ref 0–44)
ANION GAP: 8 (ref 5–15)
AST: 34 U/L (ref 15–41)
Alkaline Phosphatase: 78 U/L (ref 38–126)
BUN: 36 mg/dL — AB (ref 6–20)
CO2: 21 mmol/L — ABNORMAL LOW (ref 22–32)
Calcium: 6.5 mg/dL — ABNORMAL LOW (ref 8.9–10.3)
Chloride: 123 mmol/L — ABNORMAL HIGH (ref 98–111)
Creatinine, Ser: 1.53 mg/dL — ABNORMAL HIGH (ref 0.44–1.00)
GFR calc Af Amer: 42 mL/min — ABNORMAL LOW (ref >60)
GFR calc non Af Amer: 37 mL/min — ABNORMAL LOW (ref >60)
GLUCOSE: 762 mg/dL — AB (ref 70–99)
POTASSIUM: 3.1 mmol/L — AB (ref 3.5–5.1)
SODIUM: 152 mmol/L — AB (ref 135–145)
Total Bilirubin: 0.6 mg/dL (ref 0.3–1.2)
Total Protein: 5.4 g/dL — ABNORMAL LOW (ref 6.5–8.1)

## 2018-05-14 LAB — LACTIC ACID, PLASMA
Lactic Acid, Venous: 3.7 mmol/L (ref 0.5–1.9)
Lactic Acid, Venous: 5.1 mmol/L (ref 0.5–1.9)
Lactic Acid, Venous: 5.3 mmol/L (ref 0.5–1.9)
Lactic Acid, Venous: 4.3 mmol/L (ref 0.5–1.9)
Lactic Acid, Venous: 3.2 mmol/L (ref 0.5–1.9)
Lactic Acid, Venous: 1.9 mmol/L (ref 0.5–1.9)

## 2018-05-14 LAB — GLUCOSE, CAPILLARY
Glucose-Capillary: >600 mg/dL (ref 70–99)
Glucose-Capillary: >600 mg/dL (ref 70–99)
Glucose-Capillary: >600 mg/dL (ref 70–99)
Glucose-Capillary: 530 mg/dL (ref 70–99)
Glucose-Capillary: 532 mg/dL (ref 70–99)
Glucose-Capillary: 416 mg/dL — ABNORMAL HIGH (ref 70–99)
Glucose-Capillary: 373 mg/dL — ABNORMAL HIGH (ref 70–99)
Glucose-Capillary: 361 mg/dL — ABNORMAL HIGH (ref 70–99)

## 2018-05-14 LAB — MAGNESIUM: Magnesium: 1.8 mg/dL (ref 1.7–2.4)

## 2018-05-14 LAB — TROPONIN I
Troponin I: 0.04 ng/mL (ref <0.03)
Troponin I: 0.03 ng/mL (ref <0.03)
Troponin I: 0.03 ng/mL (ref <0.03)

## 2018-05-14 LAB — CBC WITH DIFFERENTIAL/PLATELET
Abs Immature Granulocytes: 0.14 10*3/uL — ABNORMAL HIGH (ref 0.00–0.07)
Basophils Absolute: 0 10*3/uL (ref 0.0–0.1)
Basophils Relative: 0 %
EOS PCT: 0 %
Eosinophils Absolute: 0 10*3/uL (ref 0.0–0.5)
HCT: 44.3 % (ref 36.0–46.0)
HEMOGLOBIN: 14 g/dL (ref 12.0–15.0)
IMMATURE GRANULOCYTES: 1 %
LYMPHS ABS: 5.7 10*3/uL — AB (ref 0.7–4.0)
LYMPHS PCT: 27 %
MCH: 31.6 pg (ref 26.0–34.0)
MCHC: 31.6 g/dL (ref 30.0–36.0)
MCV: 100 fL (ref 80.0–100.0)
Monocytes Absolute: 1.5 10*3/uL — ABNORMAL HIGH (ref 0.1–1.0)
Monocytes Relative: 7 %
NEUTROS PCT: 65 %
Neutro Abs: 13.4 10*3/uL — ABNORMAL HIGH (ref 1.7–7.7)
Platelets: 202 10*3/uL (ref 150–400)
RBC: 4.43 MIL/uL (ref 3.87–5.11)
RDW: 13 % (ref 11.5–15.5)
WBC: 20.7 10*3/uL — AB (ref 4.0–10.5)
nRBC: 0 % (ref 0.0–0.2)

## 2018-05-14 LAB — URINALYSIS, ROUTINE W REFLEX MICROSCOPIC
Bilirubin Urine: NEGATIVE
Glucose, UA: >=500 mg/dL — AB
Hgb urine dipstick: NEGATIVE
Ketones, ur: NEGATIVE mg/dL
Leukocytes, UA: NEGATIVE
Nitrite: NEGATIVE
Protein, ur: NEGATIVE mg/dL
SPECIFIC GRAVITY, URINE: 1.028 (ref 1.005–1.030)
pH: 5 (ref 5.0–8.0)

## 2018-05-14 LAB — INFLUENZA PANEL BY PCR (TYPE A & B)
INFLAPCR: NEGATIVE
Influenza B By PCR: NEGATIVE

## 2018-05-14 LAB — HEMOGLOBIN A1C

## 2018-05-14 LAB — PROCALCITONIN: Procalcitonin: 0.3 ng/mL

## 2018-05-14 LAB — PHOSPHORUS: Phosphorus: 2.2 mg/dL — ABNORMAL LOW (ref 2.5–4.6)

## 2018-05-14 LAB — LIPASE, BLOOD: Lipase: 35 U/L (ref 11–51)

## 2018-05-14 LAB — VALPROIC ACID LEVEL: Valproic Acid Lvl: 22 ug/mL — ABNORMAL LOW (ref 50.0–100.0)

## 2018-05-14 LAB — TSH: TSH: 0.82 u[IU]/mL (ref 0.350–4.500)

## 2018-05-14 LAB — PROTIME-INR
INR: 1.25
Prothrombin Time: 15.6 seconds — ABNORMAL HIGH (ref 11.4–15.2)

## 2018-05-14 MED ORDER — INSULIN ASPART 100 UNIT/ML ~~LOC~~ SOLN
0.0000 [IU] | SUBCUTANEOUS | Status: DC
  Administered 2018-05-15: 3 [IU] via SUBCUTANEOUS
  Administered 2018-05-15: 5 [IU] via SUBCUTANEOUS
  Filled 2018-05-14 (×2): qty 1

## 2018-05-14 MED ORDER — ENOXAPARIN SODIUM 40 MG/0.4ML ~~LOC~~ SOLN
40.0000 mg | SUBCUTANEOUS | Status: DC
  Administered 2018-05-14: 40 mg via SUBCUTANEOUS
  Filled 2018-05-14: qty 0.4

## 2018-05-14 MED ORDER — DEXTROSE-NACL 5-0.45 % IV SOLN
INTRAVENOUS | Status: DC
  Administered 2018-05-14 – 2018-05-15 (×3): via INTRAVENOUS

## 2018-05-14 MED ORDER — ORAL CARE MOUTH RINSE
15.0000 mL | OROMUCOSAL | Status: DC
  Administered 2018-05-14 – 2018-05-23 (×86): 15 mL via OROMUCOSAL

## 2018-05-14 MED ORDER — PANTOPRAZOLE SODIUM 40 MG IV SOLR
40.0000 mg | INTRAVENOUS | Status: DC
  Administered 2018-05-14 – 2018-05-15 (×2): 40 mg via INTRAVENOUS
  Filled 2018-05-14 (×2): qty 40

## 2018-05-14 MED ORDER — VALPROATE SODIUM 500 MG/5ML IV SOLN
500.0000 mg | Freq: Once | INTRAVENOUS | Status: AC
  Administered 2018-05-14: 500 mg via INTRAVENOUS
  Filled 2018-05-14: qty 5

## 2018-05-14 MED ORDER — LACTATED RINGERS IV BOLUS
500.0000 mL | Freq: Once | INTRAVENOUS | Status: AC
  Administered 2018-05-14: 500 mL via INTRAVENOUS

## 2018-05-14 MED ORDER — IPRATROPIUM-ALBUTEROL 0.5-2.5 (3) MG/3ML IN SOLN
3.0000 mL | Freq: Four times a day (QID) | RESPIRATORY_TRACT | Status: DC
  Administered 2018-05-14 – 2018-05-31 (×68): 3 mL via RESPIRATORY_TRACT
  Filled 2018-05-14 (×70): qty 3

## 2018-05-14 MED ORDER — ONDANSETRON HCL 4 MG/2ML IJ SOLN: 4.0000 mg | Freq: Four times a day (QID) | INTRAMUSCULAR | Status: DC | PRN

## 2018-05-14 MED ORDER — SODIUM CHLORIDE 0.9 % IV SOLN
2.0000 g | Freq: Two times a day (BID) | INTRAVENOUS | Status: DC
  Administered 2018-05-14 – 2018-05-16 (×4): 2 g via INTRAVENOUS
  Filled 2018-05-14 (×5): qty 2

## 2018-05-14 MED ORDER — INSULIN REGULAR(HUMAN) IN NACL 100-0.9 UT/100ML-% IV SOLN
INTRAVENOUS | Status: DC
  Administered 2018-05-14: 5.4 [IU]/h via INTRAVENOUS
  Administered 2018-05-14: 15.7 [IU]/h via INTRAVENOUS
  Filled 2018-05-14 (×3): qty 100

## 2018-05-14 MED ORDER — SODIUM CHLORIDE 0.9 % IV BOLUS
1000.0000 mL | Freq: Once | INTRAVENOUS | Status: AC
  Administered 2018-05-14: 1000 mL via INTRAVENOUS

## 2018-05-14 MED ORDER — PROPOFOL 1000 MG/100ML IV EMUL
5.0000 ug/kg/min | INTRAVENOUS | Status: DC
  Administered 2018-05-14: 10 ug/kg/min via INTRAVENOUS
  Administered 2018-05-14 – 2018-05-15 (×2): 20 ug/kg/min via INTRAVENOUS
  Administered 2018-05-15: 12 ug/kg/min via INTRAVENOUS
  Administered 2018-05-15: 10 ug/kg/min via INTRAVENOUS
  Filled 2018-05-14 (×6): qty 100

## 2018-05-14 MED ORDER — POTASSIUM CHLORIDE 10 MEQ/50ML IV SOLN
10.0000 meq | INTRAVENOUS | Status: AC
  Administered 2018-05-14 (×4): 10 meq via INTRAVENOUS
  Filled 2018-05-14 (×4): qty 50

## 2018-05-14 MED ORDER — METRONIDAZOLE IN NACL 5-0.79 MG/ML-% IV SOLN
500.0000 mg | Freq: Three times a day (TID) | INTRAVENOUS | Status: DC
  Administered 2018-05-14 – 2018-05-15 (×4): 500 mg via INTRAVENOUS
  Filled 2018-05-14 (×7): qty 100

## 2018-05-14 MED ORDER — CHLORHEXIDINE GLUCONATE 0.12% ORAL RINSE (MEDLINE KIT)
15.0000 mL | Freq: Two times a day (BID) | OROMUCOSAL | Status: DC
  Administered 2018-05-14 – 2018-05-24 (×21): 15 mL via OROMUCOSAL

## 2018-05-14 MED ORDER — FENTANYL CITRATE (PF) 100 MCG/2ML IJ SOLN
200.0000 ug | Freq: Once | INTRAMUSCULAR | Status: AC
  Administered 2018-05-14: 200 ug via INTRAVENOUS

## 2018-05-14 MED ORDER — VANCOMYCIN HCL IN DEXTROSE 1-5 GM/200ML-% IV SOLN
1000.0000 mg | Freq: Once | INTRAVENOUS | Status: AC
  Administered 2018-05-14: 1000 mg via INTRAVENOUS
  Filled 2018-05-14: qty 200

## 2018-05-14 MED ORDER — NOREPINEPHRINE 4 MG/250ML-% IV SOLN
0.0000 ug/min | INTRAVENOUS | Status: DC
  Administered 2018-05-14: 10 ug/min via INTRAVENOUS
  Administered 2018-05-14: 11 ug/min via INTRAVENOUS
  Administered 2018-05-14: 12 ug/min via INTRAVENOUS
  Administered 2018-05-15: 10 ug/min via INTRAVENOUS
  Administered 2018-05-15: 2 ug/min via INTRAVENOUS
  Administered 2018-05-16: 4 ug/min via INTRAVENOUS
  Administered 2018-05-16: 5 ug/min via INTRAVENOUS
  Filled 2018-05-14 (×8): qty 250

## 2018-05-14 MED ORDER — DOCUSATE SODIUM 100 MG PO CAPS: 100.0000 mg | ORAL_CAPSULE | Freq: Two times a day (BID) | ORAL | Status: DC

## 2018-05-14 MED ORDER — KETAMINE HCL 10 MG/ML IJ SOLN
150.0000 mg | Freq: Once | INTRAMUSCULAR | Status: AC
  Administered 2018-05-14: 150 mg via INTRAVENOUS

## 2018-05-14 MED ORDER — LACTATED RINGERS IV SOLN
INTRAVENOUS | Status: DC
  Administered 2018-05-14 (×2): via INTRAVENOUS

## 2018-05-14 MED ORDER — SODIUM CHLORIDE 0.9 % IV BOLUS
2000.0000 mL | Freq: Once | INTRAVENOUS | Status: AC
  Administered 2018-05-14: 2000 mL via INTRAVENOUS

## 2018-05-14 MED ORDER — ONDANSETRON HCL 4 MG PO TABS: 4.0000 mg | ORAL_TABLET | Freq: Four times a day (QID) | ORAL | Status: DC | PRN

## 2018-05-14 MED ORDER — SODIUM CHLORIDE 0.9 % IV SOLN
INTRAVENOUS | Status: AC | PRN
  Administered 2018-05-14: 1000 mL via INTRAVENOUS

## 2018-05-14 MED ORDER — SODIUM CHLORIDE 0.9 % IV SOLN
2.0000 g | Freq: Once | INTRAVENOUS | Status: AC
  Administered 2018-05-14: 2 g via INTRAVENOUS
  Filled 2018-05-14: qty 2

## 2018-05-14 MED ORDER — ACETAMINOPHEN 325 MG PO TABS: 650.0000 mg | ORAL_TABLET | Freq: Four times a day (QID) | ORAL | Status: DC | PRN

## 2018-05-14 MED ORDER — ACETAMINOPHEN 650 MG RE SUPP: 650.0000 mg | Freq: Four times a day (QID) | RECTAL | Status: DC | PRN

## 2018-05-14 MED ORDER — POLYETHYLENE GLYCOL 3350 17 G PO PACK
17.0000 g | PACK | Freq: Every day | ORAL | Status: DC
  Administered 2018-05-15 – 2018-05-23 (×8): 17 g via NASOGASTRIC
  Filled 2018-05-14 (×8): qty 1

## 2018-05-14 MED ORDER — POTASSIUM CHLORIDE IN NACL 40-0.9 MEQ/L-% IV SOLN
INTRAVENOUS | Status: DC
  Filled 2018-05-14 (×3): qty 1000

## 2018-05-14 MED ORDER — INSULIN GLARGINE 100 UNIT/ML ~~LOC~~ SOLN
7.0000 [IU] | Freq: Every day | SUBCUTANEOUS | Status: DC
  Administered 2018-05-15: 7 [IU] via SUBCUTANEOUS
  Filled 2018-05-14 (×2): qty 0.07

## 2018-05-14 MED ORDER — ACETAMINOPHEN 10 MG/ML IV SOLN
1000.0000 mg | Freq: Once | INTRAVENOUS | Status: AC
  Administered 2018-05-14: 1000 mg via INTRAVENOUS
  Filled 2018-05-14: qty 100

## 2018-05-14 MED ORDER — VANCOMYCIN HCL 10 G IV SOLR
1250.0000 mg | INTRAVENOUS | Status: DC
  Administered 2018-05-14 – 2018-05-15 (×2): 1250 mg via INTRAVENOUS
  Filled 2018-05-14 (×3): qty 1250

## 2018-05-14 MED ORDER — FENTANYL 2500MCG IN NS 250ML (10MCG/ML) PREMIX INFUSION
100.0000 ug/h | INTRAVENOUS | Status: DC
  Administered 2018-05-14 (×2): 100 ug/h via INTRAVENOUS
  Filled 2018-05-14 (×2): qty 250

## 2018-05-14 MED ORDER — ROCURONIUM BROMIDE 50 MG/5ML IV SOLN
80.0000 mg | Freq: Once | INTRAVENOUS | Status: AC
  Administered 2018-05-14: 80 mg via INTRAVENOUS
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
## 2018-05-14 NOTE — ED Notes (Signed)
Family at bedside, briefed on care plan and past events

## 2018-05-14 NOTE — Progress Notes (Signed)
Notified Dr Lavetta NielsenBasher regarding patients lactic acid of 5.3. Orders for LR bolus of 500.

## 2018-05-14 NOTE — ED Notes (Signed)
Pharm called

## 2018-05-14 NOTE — Progress Notes (Signed)
CODE SEPSIS - PHARMACY COMMUNICATION  **Broad Spectrum Antibiotics should be administered within 1 hour of Sepsis diagnosis**  Time Code Sepsis Called/Page Received: 1215 0353  Antibiotics Ordered: 1215 0353  Time of 1st antibiotic administration: 1215 0419  Additional action taken by pharmacy:   If necessary, Name of Provider/Nurse Contacted:     Erich MontaneMcBane,Reagyn Facemire S ,PharmD Clinical Pharmacist  05/14/2018  6:06 AM
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
## 2018-05-14 NOTE — ED Triage Notes (Signed)
.  Patient presents to Emergency Department via EMS with complaints of hypotension and tachycardia, from [facility], per EMS staff "couldn't get a IV."

## 2018-05-14 NOTE — Progress Notes (Signed)
Notified ICU MD lactic 4.3. No additional orders.
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
## 2018-05-14 NOTE — Progress Notes (Signed)
Pt transported on vent to & from CT without incident 
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
## 2018-05-14 NOTE — Progress Notes (Signed)
Vt lowered from 500 to 450 & RR from 16 to 14 after ABG

## 2018-05-14 NOTE — ED Notes (Signed)
Report finished att, Calling RT for transport

## 2018-05-14 NOTE — Progress Notes (Signed)
Icu MD notified of lactic of 5.1. Fluids ordered. Replacing potassium.

## 2018-05-15 LAB — BLOOD GAS, ARTERIAL
ACID-BASE DEFICIT: 2.9 mmol/L — AB (ref 0.0–2.0)
Bicarbonate: 23.7 mmol/L (ref 20.0–28.0)
FIO2: 0.3
MECHVT: 450 mL
Mechanical Rate: 14
O2 SAT: 97.1 %
PCO2 ART: 47 mmHg (ref 32.0–48.0)
PEEP: 5 cmH2O
PH ART: 7.31 — AB (ref 7.350–7.450)
PO2 ART: 100 mmHg (ref 83.0–108.0)
Patient temperature: 37

## 2018-05-15 LAB — BLOOD CULTURE ID PANEL (REFLEXED)
Acinetobacter baumannii: NOT DETECTED
CANDIDA GLABRATA: NOT DETECTED
CANDIDA PARAPSILOSIS: NOT DETECTED
Candida albicans: NOT DETECTED
Candida krusei: NOT DETECTED
Candida tropicalis: NOT DETECTED
Enterobacter cloacae complex: NOT DETECTED
Enterobacteriaceae species: NOT DETECTED
Enterococcus species: NOT DETECTED
Escherichia coli: NOT DETECTED
Haemophilus influenzae: NOT DETECTED
Klebsiella oxytoca: NOT DETECTED
Klebsiella pneumoniae: NOT DETECTED
Listeria monocytogenes: NOT DETECTED
Neisseria meningitidis: NOT DETECTED
Proteus species: NOT DETECTED
Pseudomonas aeruginosa: NOT DETECTED
Serratia marcescens: NOT DETECTED
Staphylococcus aureus (BCID): NOT DETECTED
Staphylococcus species: NOT DETECTED
Streptococcus agalactiae: NOT DETECTED
Streptococcus pneumoniae: NOT DETECTED
Streptococcus pyogenes: NOT DETECTED
Streptococcus species: DETECTED — AB

## 2018-05-15 LAB — COMPREHENSIVE METABOLIC PANEL
ALT: 25 U/L (ref 0–44)
AST: 45 U/L — ABNORMAL HIGH (ref 15–41)
Albumin: 2.4 g/dL — ABNORMAL LOW (ref 3.5–5.0)
Alkaline Phosphatase: 75 U/L (ref 38–126)
Anion gap: 7 (ref 5–15)
BUN: 18 mg/dL (ref 6–20)
CO2: 23 mmol/L (ref 22–32)
CREATININE: 0.96 mg/dL (ref 0.44–1.00)
Calcium: 7.9 mg/dL — ABNORMAL LOW (ref 8.9–10.3)
Chloride: 116 mmol/L — ABNORMAL HIGH (ref 98–111)
GFR calc Af Amer: >60 mL/min (ref >60)
GFR calc non Af Amer: >60 mL/min (ref >60)
Glucose, Bld: 294 mg/dL — ABNORMAL HIGH (ref 70–99)
Potassium: 3.1 mmol/L — ABNORMAL LOW (ref 3.5–5.1)
Sodium: 146 mmol/L — ABNORMAL HIGH (ref 135–145)
Total Bilirubin: 0.7 mg/dL (ref 0.3–1.2)
Total Protein: 6.4 g/dL — ABNORMAL LOW (ref 6.5–8.1)

## 2018-05-15 LAB — CBC
HCT: 41.8 % (ref 36.0–46.0)
Hemoglobin: 13.1 g/dL (ref 12.0–15.0)
MCH: 31.5 pg (ref 26.0–34.0)
MCHC: 31.3 g/dL (ref 30.0–36.0)
MCV: 100.5 fL — AB (ref 80.0–100.0)
Platelets: 123 10*3/uL — ABNORMAL LOW (ref 150–400)
RBC: 4.16 MIL/uL (ref 3.87–5.11)
RDW: 13 % (ref 11.5–15.5)
WBC: 17.5 10*3/uL — ABNORMAL HIGH (ref 4.0–10.5)
nRBC: 0 % (ref 0.0–0.2)

## 2018-05-15 LAB — GLUCOSE, CAPILLARY
Glucose-Capillary: 109 mg/dL — ABNORMAL HIGH (ref 70–99)
Glucose-Capillary: 112 mg/dL — ABNORMAL HIGH (ref 70–99)
Glucose-Capillary: 112 mg/dL — ABNORMAL HIGH (ref 70–99)
Glucose-Capillary: 120 mg/dL — ABNORMAL HIGH (ref 70–99)
Glucose-Capillary: 130 mg/dL — ABNORMAL HIGH (ref 70–99)
Glucose-Capillary: 137 mg/dL — ABNORMAL HIGH (ref 70–99)
Glucose-Capillary: 137 mg/dL — ABNORMAL HIGH (ref 70–99)
Glucose-Capillary: 144 mg/dL — ABNORMAL HIGH (ref 70–99)
Glucose-Capillary: 145 mg/dL — ABNORMAL HIGH (ref 70–99)
Glucose-Capillary: 151 mg/dL — ABNORMAL HIGH (ref 70–99)
Glucose-Capillary: 153 mg/dL — ABNORMAL HIGH (ref 70–99)
Glucose-Capillary: 156 mg/dL — ABNORMAL HIGH (ref 70–99)
Glucose-Capillary: 158 mg/dL — ABNORMAL HIGH (ref 70–99)
Glucose-Capillary: 159 mg/dL — ABNORMAL HIGH (ref 70–99)
Glucose-Capillary: 165 mg/dL — ABNORMAL HIGH (ref 70–99)
Glucose-Capillary: 167 mg/dL — ABNORMAL HIGH (ref 70–99)
Glucose-Capillary: 170 mg/dL — ABNORMAL HIGH (ref 70–99)
Glucose-Capillary: 191 mg/dL — ABNORMAL HIGH (ref 70–99)
Glucose-Capillary: 196 mg/dL — ABNORMAL HIGH (ref 70–99)
Glucose-Capillary: 203 mg/dL — ABNORMAL HIGH (ref 70–99)
Glucose-Capillary: 219 mg/dL — ABNORMAL HIGH (ref 70–99)
Glucose-Capillary: 220 mg/dL — ABNORMAL HIGH (ref 70–99)
Glucose-Capillary: 246 mg/dL — ABNORMAL HIGH (ref 70–99)
Glucose-Capillary: 247 mg/dL — ABNORMAL HIGH (ref 70–99)
Glucose-Capillary: 272 mg/dL — ABNORMAL HIGH (ref 70–99)
Glucose-Capillary: 282 mg/dL — ABNORMAL HIGH (ref 70–99)
Glucose-Capillary: 291 mg/dL — ABNORMAL HIGH (ref 70–99)
Glucose-Capillary: 314 mg/dL — ABNORMAL HIGH (ref 70–99)
Glucose-Capillary: >600 mg/dL (ref 70–99)

## 2018-05-15 LAB — HEMOGLOBIN A1C
Hgb A1c MFr Bld: 10.4 % — ABNORMAL HIGH (ref 4.8–5.6)
Mean Plasma Glucose: 252 mg/dL

## 2018-05-15 LAB — MRSA PCR SCREENING: MRSA by PCR: NEGATIVE

## 2018-05-15 LAB — POTASSIUM: Potassium: 4.1 mmol/L (ref 3.5–5.1)

## 2018-05-15 LAB — PHOSPHORUS: Phosphorus: 2.5 mg/dL (ref 2.5–4.6)

## 2018-05-15 LAB — MAGNESIUM: Magnesium: 1.6 mg/dL — ABNORMAL LOW (ref 1.7–2.4)

## 2018-05-15 LAB — TROPONIN I: Troponin I: 0.03 ng/mL (ref <0.03)

## 2018-05-15 LAB — LACTIC ACID, PLASMA: Lactic Acid, Venous: 1.9 mmol/L (ref 0.5–1.9)

## 2018-05-15 MED ORDER — VITAL HIGH PROTEIN PO LIQD: 1000.0000 mL | ORAL | Status: DC

## 2018-05-15 MED ORDER — INSULIN REGULAR(HUMAN) IN NACL 100-0.9 UT/100ML-% IV SOLN
INTRAVENOUS | Status: AC
  Administered 2018-05-15: 2.5 [IU]/h via INTRAVENOUS
  Administered 2018-05-16: 3.7 [IU]/h via INTRAVENOUS
  Filled 2018-05-15 (×2): qty 100

## 2018-05-15 MED ORDER — MAGNESIUM SULFATE 2 GM/50ML IV SOLN
2.0000 g | Freq: Once | INTRAVENOUS | Status: AC
  Administered 2018-05-15: 2 g via INTRAVENOUS
  Filled 2018-05-15: qty 50

## 2018-05-15 MED ORDER — SODIUM CHLORIDE 0.9 % IV SOLN
INTRAVENOUS | Status: DC | PRN
  Administered 2018-05-15 – 2018-05-20 (×2): 250 mL via INTRAVENOUS
  Administered 2018-05-29: 20 mL via INTRAVENOUS

## 2018-05-15 MED ORDER — VALPROIC ACID 250 MG/5ML PO SOLN
125.0000 mg | Freq: Two times a day (BID) | ORAL | Status: DC
  Administered 2018-05-15 – 2018-05-22 (×15): 125 mg
  Filled 2018-05-15 (×19): qty 5

## 2018-05-15 MED ORDER — FENTANYL 2500MCG IN NS 250ML (10MCG/ML) PREMIX INFUSION
25.0000 ug/h | INTRAVENOUS | Status: DC
  Administered 2018-05-15: 100 ug/h via INTRAVENOUS
  Administered 2018-05-18: 50 ug/h via INTRAVENOUS
  Administered 2018-05-20: 100 ug/h via INTRAVENOUS
  Administered 2018-05-20: 200 ug/h via INTRAVENOUS
  Administered 2018-05-21: 100 ug/h via INTRAVENOUS
  Filled 2018-05-15 (×5): qty 250

## 2018-05-15 MED ORDER — GABAPENTIN 250 MG/5ML PO SOLN
300.0000 mg | Freq: Three times a day (TID) | ORAL | Status: DC
  Administered 2018-05-15 – 2018-05-23 (×21): 300 mg
  Filled 2018-05-15 (×36): qty 6

## 2018-05-15 MED ORDER — ENOXAPARIN SODIUM 40 MG/0.4ML ~~LOC~~ SOLN
40.0000 mg | SUBCUTANEOUS | Status: DC
  Administered 2018-05-15 – 2018-05-31 (×16): 40 mg via SUBCUTANEOUS
  Filled 2018-05-15 (×16): qty 0.4

## 2018-05-15 MED ORDER — ACETAMINOPHEN 325 MG PO TABS
650.0000 mg | ORAL_TABLET | Freq: Four times a day (QID) | ORAL | Status: DC | PRN
  Administered 2018-05-18 – 2018-05-22 (×2): 650 mg
  Filled 2018-05-15 (×2): qty 2

## 2018-05-15 MED ORDER — ACETAMINOPHEN 650 MG RE SUPP
650.0000 mg | Freq: Four times a day (QID) | RECTAL | Status: DC | PRN
  Administered 2018-05-20 – 2018-05-24 (×2): 650 mg via RECTAL
  Filled 2018-05-15 (×3): qty 1

## 2018-05-15 MED ORDER — POTASSIUM CHLORIDE 20 MEQ PO PACK
40.0000 meq | PACK | Freq: Once | ORAL | Status: AC
  Administered 2018-05-15: 40 meq
  Filled 2018-05-15: qty 2

## 2018-05-15 MED ORDER — PRO-STAT SUGAR FREE PO LIQD
30.0000 mL | Freq: Two times a day (BID) | ORAL | Status: DC
  Administered 2018-05-15 – 2018-05-22 (×14): 30 mL

## 2018-05-15 MED ORDER — KCL IN DEXTROSE-NACL 40-5-0.45 MEQ/L-%-% IV SOLN
INTRAVENOUS | Status: DC
  Administered 2018-05-15: 10:00:00 via INTRAVENOUS
  Filled 2018-05-15 (×4): qty 1000

## 2018-05-15 MED ORDER — FAMOTIDINE 20 MG PO TABS
20.0000 mg | ORAL_TABLET | Freq: Two times a day (BID) | ORAL | Status: DC
  Administered 2018-05-15 – 2018-05-22 (×14): 20 mg
  Filled 2018-05-15 (×16): qty 1

## 2018-05-15 MED ORDER — FENTANYL BOLUS VIA INFUSION
50.0000 ug | INTRAVENOUS | Status: DC | PRN
  Administered 2018-05-16 – 2018-05-22 (×3): 50 ug via INTRAVENOUS
  Filled 2018-05-15: qty 50

## 2018-05-15 MED ORDER — VITAL AF 1.2 CAL PO LIQD
1000.0000 mL | ORAL | Status: DC
  Administered 2018-05-15 – 2018-05-21 (×4): 1000 mL

## 2018-05-15 MED ORDER — ASPIRIN 81 MG PO CHEW
81.0000 mg | CHEWABLE_TABLET | Freq: Every day | ORAL | Status: DC
  Administered 2018-05-15 – 2018-06-01 (×14): 81 mg
  Filled 2018-05-15 (×14): qty 1

## 2018-05-15 NOTE — Progress Notes (Signed)
CRITICAL CARE NOTE  CC  follow up respiratory failure  SUBJECTIVE Patient remains critically ill Prognosis is guarded     SIGNIFICANT EVENTS    BP (!) 78/62   Pulse 95   Temp 98.2 F (36.8 C) (Oral)   Resp 14   Ht 5\' 7"  (1.702 m)   Wt 83.9 kg   SpO2 100%   BMI 28.97 kg/m    REVIEW OF SYSTEMS  PATIENT IS UNABLE TO PROVIDE COMPLETE REVIEW OF SYSTEMS DUE TO SEVERE CRITICAL ILLNESS   PHYSICAL EXAMINATION:  GENERAL:critically ill appearing, +resp distress HEAD: Normocephalic, atraumatic.  EYES: Pupils equal, round, reactive to light.  No scleral icterus.  MOUTH: Moist mucosal membrane. NECK: Supple. No thyromegaly. No nodules. No JVD.  PULMONARY: +rhonchi, +wheezing CARDIOVASCULAR: S1 and S2. Regular rate and rhythm. No murmurs, rubs, or gallops.  GASTROINTESTINAL: Soft, nontender, -distended. No masses. Positive bowel sounds. No hepatosplenomegaly.  MUSCULOSKELETAL: No swelling, clubbing, or edema.  NEUROLOGIC: obtunded, GCS<8 SKIN:intact,warm,dry      Indwelling Urinary Catheter continued, requirement due to   Reason to continue Indwelling Urinary Catheter for strict Intake/Output monitoring for hemodynamic instability   Central Line continued, requirement due to   Reason to continue [employer] Line Monitoring of central venous pressure or other hemodynamic parameters   Ventilator continued, requirement due to, resp failure    Ventilator Sedation RASS 0 to -2     ASSESSMENT AND PLAN SYNOPSIS 62 year old female presenting with sepsis secondary to pneumonia, acute hypoxic respiratory failure, septic shock requiring pressors, hyperosmolar hyperglycemic state, AKI and multiple electrolyte abnormalities   Severe Hypoxic and Hypercapnic Respiratory Failure -continue Full MV support -continue Bronchodilator Therapy -Wean Fio2 and PEEP as tolerated Unable to wean due to severe hypoxia  Renal Failure-most likely due to ATN -follow chem 7 -follow  UO -continue Foley Catheter-assess need daily   NEUROLOGY - intubated and sedated - minimal sedation to achieve a RASS goal: -1   Septic shock -use vasopressors to keep MAP>65 -follow ABG and LA -follow up cultures -emperic ABX -consider stress dose steroids -aggressive IV fluid resuscitation  CARDIAC ICU monitoring  ID -continue IV abx as prescibed -follow up cultures  GI/Nutrition GI PROPHYLAXIS as indicated DIET-->TF's as tolerated Constipation protocol as indicated  ENDO - ICU hypoglycemic\Hyperglycemia protocol -check FSBS per protocol   ELECTROLYTES -follow labs as needed -replace as needed -pharmacy consultation and following   DVT/GI PRX ordered TRANSFUSIONS AS NEEDED MONITOR FSBS ASSESS the need for LABS as needed   Critical Care Time devoted to patient care services described in this note is 45 minutes.   Overall, patient is critically ill, prognosis is guarded.  Patient with Multiorgan failure and at high risk for cardiac arrest and death.    Will need to update family when able  Lucie LeatherKurian David Embree Brawley, M.D.  [facility]  Medical Director [facility] Medical Director [facility] Cardio-P[facility] Department
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
## 2018-05-15 NOTE — Progress Notes (Addendum)
PharmacyMonitoring Consult:  Pharmacy consulted to assist in monitoring and replacing electrolytes, constipation management, and blood glucose management in this 62 y.o. female admitted on 05/14/2018 with Hypotension  Patient is currently requiring mechanical ventilation and sedation regimen includes continuous fentanyl and propofol infusion.    Labs:  Sodium (mmol/L)  Date Value  05/15/2018 146 (H)  12/26/2013 138   Potassium (mmol/L)  Date Value  05/15/2018 3.1 (L)  12/26/2013 3.4 (L)   Magnesium (mg/dL)  Date Value  16/10/960412/16/2019 1.6 (L)   Phosphorus (mg/dL)  Date Value  54/09/811912/16/2019 2.5   Calcium (mg/dL)  Date Value  14/78/295612/16/2019 7.9 (L)   Calcium, Total (mg/dL)  Date Value  21/30/865707/29/2015 9.0   Albumin (g/dL)  Date Value  84/69/629512/16/2019 2.4 (L)  12/26/2013 3.6    Assessment/Plan: 1. Electrolytes: Will order potassium 40mEq VT x 1 and magnesium 2g IV x 1. Patient receiving D51/2NS with Potassium 3640mEq/L at 11325mL/hr; fluids discontinued at ~ 1530. Will obtain follow up potassium at 1800 and all electrolytes with am labs. Will replace for goal potassium ~ 4 and goal magnesium ~ 2.    2. Constipation: no bowel movement noted. Will order senna/docusate 1 tab VT BID.   3. Glucose: Will resume ICU Hyperglycemia Phase 2. Will follow protocol and diabetes team recommendations.   Pharmacy will continue to monitor and adjust per consult.   Simpson,Michael L 05/15/2018 3:00 PM

## 2018-05-15 NOTE — Progress Notes (Signed)
Notified Maggie, NP that patient has had 7 CBG's less that 180. Acknowledged and awaiting transition orders.

## 2018-05-16 LAB — GLUCOSE, CAPILLARY
Glucose-Capillary: 148 mg/dL — ABNORMAL HIGH (ref 70–99)
Glucose-Capillary: 178 mg/dL — ABNORMAL HIGH (ref 70–99)
Glucose-Capillary: 178 mg/dL — ABNORMAL HIGH (ref 70–99)
Glucose-Capillary: 168 mg/dL — ABNORMAL HIGH (ref 70–99)
Glucose-Capillary: 166 mg/dL — ABNORMAL HIGH (ref 70–99)
Glucose-Capillary: 170 mg/dL — ABNORMAL HIGH (ref 70–99)
Glucose-Capillary: 183 mg/dL — ABNORMAL HIGH (ref 70–99)
Glucose-Capillary: 189 mg/dL — ABNORMAL HIGH (ref 70–99)
Glucose-Capillary: 168 mg/dL — ABNORMAL HIGH (ref 70–99)
Glucose-Capillary: 155 mg/dL — ABNORMAL HIGH (ref 70–99)
Glucose-Capillary: 169 mg/dL — ABNORMAL HIGH (ref 70–99)
Glucose-Capillary: 172 mg/dL — ABNORMAL HIGH (ref 70–99)
Glucose-Capillary: 150 mg/dL — ABNORMAL HIGH (ref 70–99)
Glucose-Capillary: 147 mg/dL — ABNORMAL HIGH (ref 70–99)
Glucose-Capillary: 121 mg/dL — ABNORMAL HIGH (ref 70–99)
Glucose-Capillary: 113 mg/dL — ABNORMAL HIGH (ref 70–99)
Glucose-Capillary: 111 mg/dL — ABNORMAL HIGH (ref 70–99)
Glucose-Capillary: 162 mg/dL — ABNORMAL HIGH (ref 70–99)

## 2018-05-16 LAB — CBC
HEMATOCRIT: 40 % (ref 36.0–46.0)
Hemoglobin: 12.6 g/dL (ref 12.0–15.0)
MCH: 31 pg (ref 26.0–34.0)
MCHC: 31.5 g/dL (ref 30.0–36.0)
MCV: 98.5 fL (ref 80.0–100.0)
Platelets: 100 10*3/uL — ABNORMAL LOW (ref 150–400)
RBC: 4.06 MIL/uL (ref 3.87–5.11)
RDW: 13.1 % (ref 11.5–15.5)
WBC: 13.4 10*3/uL — ABNORMAL HIGH (ref 4.0–10.5)
nRBC: 0.2 % (ref 0.0–0.2)

## 2018-05-16 LAB — BASIC METABOLIC PANEL
Anion gap: 2 — ABNORMAL LOW (ref 5–15)
BUN: 14 mg/dL (ref 6–20)
CHLORIDE: 116 mmol/L — AB (ref 98–111)
CO2: 25 mmol/L (ref 22–32)
Calcium: 8.2 mg/dL — ABNORMAL LOW (ref 8.9–10.3)
Creatinine, Ser: 0.92 mg/dL (ref 0.44–1.00)
GFR calc Af Amer: >60 mL/min (ref >60)
GFR calc non Af Amer: >60 mL/min (ref >60)
Glucose, Bld: 195 mg/dL — ABNORMAL HIGH (ref 70–99)
Potassium: 4.1 mmol/L (ref 3.5–5.1)
Sodium: 143 mmol/L (ref 135–145)

## 2018-05-16 LAB — URINE CULTURE: Culture: >=100000 — AB

## 2018-05-16 LAB — MAGNESIUM: Magnesium: 2.1 mg/dL (ref 1.7–2.4)

## 2018-05-16 LAB — PHOSPHORUS: Phosphorus: 1.6 mg/dL — ABNORMAL LOW (ref 2.5–4.6)

## 2018-05-16 LAB — VALPROIC ACID LEVEL: Valproic Acid Lvl: 32 ug/mL — ABNORMAL LOW (ref 50.0–100.0)

## 2018-05-16 MED ORDER — INSULIN DETEMIR 100 UNIT/ML ~~LOC~~ SOLN
15.0000 [IU] | Freq: Two times a day (BID) | SUBCUTANEOUS | Status: DC
  Administered 2018-05-17: 15 [IU] via SUBCUTANEOUS
  Filled 2018-05-16 (×2): qty 0.15

## 2018-05-16 MED ORDER — SENNOSIDES-DOCUSATE SODIUM 8.6-50 MG PO TABS
2.0000 | ORAL_TABLET | Freq: Two times a day (BID) | ORAL | Status: DC
  Administered 2018-05-16 – 2018-05-23 (×12): 2
  Filled 2018-05-16 (×14): qty 2

## 2018-05-16 MED ORDER — SODIUM CHLORIDE 0.9 % IV SOLN
2.0000 g | INTRAVENOUS | Status: DC
  Administered 2018-05-16 – 2018-05-17 (×2): 2 g via INTRAVENOUS
  Filled 2018-05-16: qty 20
  Filled 2018-05-16: qty 2
  Filled 2018-05-16: qty 20

## 2018-05-16 MED ORDER — INSULIN DETEMIR 100 UNIT/ML ~~LOC~~ SOLN
15.0000 [IU] | Freq: Once | SUBCUTANEOUS | Status: AC
  Administered 2018-05-16: 15 [IU] via SUBCUTANEOUS
  Filled 2018-05-16: qty 0.15

## 2018-05-16 MED ORDER — SODIUM PHOSPHATES 45 MMOLE/15ML IV SOLN
30.0000 mmol | Freq: Once | INTRAVENOUS | Status: AC
  Administered 2018-05-16: 30 mmol via INTRAVENOUS
  Filled 2018-05-16: qty 10

## 2018-05-16 MED ORDER — INSULIN ASPART 100 UNIT/ML ~~LOC~~ SOLN
2.0000 [IU] | SUBCUTANEOUS | Status: DC
  Administered 2018-05-16 – 2018-05-17 (×2): 4 [IU] via SUBCUTANEOUS
  Filled 2018-05-16 (×2): qty 1

## 2018-05-16 MED ORDER — INSULIN DETEMIR 100 UNIT/ML ~~LOC~~ SOLN
15.0000 [IU] | Freq: Two times a day (BID) | SUBCUTANEOUS | Status: DC
  Filled 2018-05-16 (×2): qty 0.15

## 2018-05-16 MED ORDER — DEXTROSE 10 % IV SOLN
INTRAVENOUS | Status: DC | PRN
  Administered 2018-05-17: 08:00:00 via INTRAVENOUS

## 2018-05-16 MED ORDER — INSULIN ASPART 100 UNIT/ML ~~LOC~~ SOLN
3.0000 [IU] | SUBCUTANEOUS | Status: DC
  Administered 2018-05-17: 3 [IU] via SUBCUTANEOUS
  Filled 2018-05-16: qty 1

## 2018-05-16 NOTE — Progress Notes (Signed)
PharmacyMonitoring Consult:  Pharmacy consulted to assist in monitoring and replacing electrolytes, constipation management, and blood glucose management in this 62 y.o. female admitted on 05/14/2018 with Hypotension  Patient is currently requiring mechanical ventilation and sedation regimen includes continuous fentanyl and propofol infusion.    Labs:  Sodium (mmol/L)  Date Value  05/16/2018 143  12/26/2013 138   Potassium (mmol/L)  Date Value  05/16/2018 4.1  12/26/2013 3.4 (L)   Magnesium (mg/dL)  Date Value  16/10/960412/17/2019 2.1   Phosphorus (mg/dL)  Date Value  54/09/811912/17/2019 1.6 (L)   Calcium (mg/dL)  Date Value  14/78/295612/17/2019 8.2 (L)   Calcium, Total (mg/dL)  Date Value  21/30/865707/29/2015 9.0   Albumin (g/dL)  Date Value  84/69/629512/16/2019 2.4 (L)  12/26/2013 3.6    Assessment/Plan: 1. Electrolytes: Sodium phosphorus 30mmol IV x 1.  Will obtain electrolytes with am labs. Will replace for goal potassium ~ 4, goal magnesium ~ 2, and goal phosphorus > 2.5. .    2. Constipation: no bowel movement noted. Senna/docusate 2 tab VT BID.   3. Glucose: Will continue ICU Hyperglycemia Phase 2. Will follow protocol and diabetes team recommendations.   Pharmacy will continue to monitor and adjust per consult.   Simpson,Michael L 05/16/2018 2:21 PM

## 2018-05-16 NOTE — Progress Notes (Signed)
Inpatient Diabetes Program Recommendations  AACE/ADA: New Consensus Statement on Inpatient Glycemic Control (2015)  Target Ranges:  Prepandial:   less than 140 mg/dL      Peak postprandial:   less than 180 mg/dL (1-2 hours)      Critically ill patients:  140 - 180 mg/dL   Lab Results  Component Value Date   GLUCAP 155 (H) 05/16/2018   HGBA1C 10.4 (H) 05/14/2018    Review of Glycemic Control Results for Kaitlyn Ruiz, Kaitlyn Ruiz (MRN [id number]) as of 05/16/2018 09:26  Ref. Range 05/16/2018 04:38 05/16/2018 05:31 05/16/2018 06:30 05/16/2018 07:29 05/16/2018 08:28  Glucose-Capillary Latest Ref Range: 70 - 99 mg/dL 562170 (H) 130183 (H) 865189 (H) 168 (H) 155 (H)   Diabetes history: DM 2 Outpatient Diabetes medications: None noted Current orders for Inpatient glycemic control: IV insulin- Inpatient Diabetes Program Recommendations:    Note that patient had to be restarted on IV insulin on 12/16.  Blood sugars now well controlled and patient is on Tube feeds.  If appropriate, consider transition per the ICU glycemic control order set: Phase 3.  Transition will need to include Levemir, correction and tube feed coverage based on last insulin drip rate.    Thanks,  Beryl MeagerJenny Ancil Dewan, RN, BC-ADM Inpatient Diabetes Coordinator Pager [phone] (8a-5p)
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
## 2018-05-16 NOTE — Progress Notes (Signed)
CRITICAL CARE NOTE  CC  Follow up resp failure  SUBJECTIVE Remains on vent Failed vent wean due to resp muscle fatigue and deleriuos critically ill      SIGNIFICANT EVENTS 12/16 Vent support, failed weaning trials   BP 90/64 (BP Location: Right Arm)   Pulse (!) 115   Temp 99.8 F (37.7 C) (Oral)   Resp 16   Ht '5\' 7"'  (1.702 m)   Wt 88.4 kg   SpO2 100%   BMI 30.52 kg/m    REVIEW OF SYSTEMS  PATIENT IS UNABLE TO PROVIDE COMPLETE REVIEW OF SYSTEMS DUE TO SEVERE CRITICAL ILLNESS   PHYSICAL EXAMINATION: PHYSICAL EXAMINATION:  GENERAL:critically ill appearing, +resp distress HEAD: Normocephalic, atraumatic.  EYES: Pupils equal, round, reactive to light.  No scleral icterus.  MOUTH: Moist mucosal membrane. NECK: Supple. No thyromegaly. No nodules. No JVD.  PULMONARY: +rhonchi, +wheezing CARDIOVASCULAR: S1 and S2. Regular rate and rhythm. No murmurs, rubs, or gallops.  GASTROINTESTINAL: Soft, nontender, -distended. No masses. Positive bowel sounds. No hepatosplenomegaly.  MUSCULOSKELETAL: No swelling, clubbing, or edema.  NEUROLOGIC: obtunded SKIN:intact,warm,dry        Indwelling Urinary Catheter continued, requirement due to   Reason to continue Indwelling Urinary Catheter for strict Intake/Output monitoring for hemodynamic instability   Central Line continued, requirement due to   Reason to continue [employer] Monitoring of central venous pressure or other hemodynamic parameters   Ventilator continued, requirement due to, resp failure    Ventilator Sedation RASS 0 to -2     ASSESSMENT AND PLAN SYNOPSIS   62 yo female with pneumonia/sepsis with severe resp failure with septic shock  Severe Hypoxic and Hypercapnic Respiratory Failure -continue Full MV support -continue Bronchodilator Therapy -Wean Fio2 and PEEP as tolerated -will perform SAT/SBT when respiratory parameters are met   Renal Failure- -follow chem 7 -follow UO -continue  Foley Catheter-assess need   NEUROLOGY - intubated and sedated - minimal sedation to achieve a RASS goal: -1   Septic shock -use vasopressors to keep MAP>65 -follow ABG and LA -follow up cultures -emperic ABX -aggressive IV fluid Resuscitation    GI/Nutrition GI PROPHYLAXIS as indicated DIET-->TF's as tolerated Constipation protocol as indicated   ELECTROLYTES -follow labs as needed -replace as needed -pharmacy consultation and following    DVT/GI PRX ordered TRANSFUSIONS AS NEEDED MONITOR FSBS ASSESS the need for LABS as needed     Critical Care Time devoted to patient care services described in this note is 35 minutes.   Overall, patient is critically ill, prognosis is guarded.  Patient with Multiorgan failure and at high risk for cardiac arrest and death.    Corrin Parker, M.D.  [facility]  Medical Director [place] Director [facility] Cardio-Pulmonary Department

## 2018-05-17 ENCOUNTER — Inpatient Hospital Stay (HOSPITAL_COMMUNITY)
Admit: 2018-05-17 | Discharge: 2018-05-17 | Disposition: A | Discharge status: Home / Self Care | Payer: Medicare Other | Attending: Internal Medicine | Admitting: Internal Medicine

## 2018-05-17 DIAGNOSIS — J9601 Acute respiratory failure with hypoxia: Secondary | ICD-10-CM

## 2018-05-17 DIAGNOSIS — Z978 Presence of other specified devices: Secondary | ICD-10-CM

## 2018-05-17 DIAGNOSIS — Z515 Encounter for palliative care: Secondary | ICD-10-CM

## 2018-05-17 LAB — MAGNESIUM: Magnesium: 1.8 mg/dL (ref 1.7–2.4)

## 2018-05-17 LAB — PHOSPHORUS: Phosphorus: 2.6 mg/dL (ref 2.5–4.6)

## 2018-05-17 LAB — CBC
HCT: 38.5 % (ref 36.0–46.0)
Hemoglobin: 12.5 g/dL (ref 12.0–15.0)
MCH: 31.3 pg (ref 26.0–34.0)
MCHC: 32.5 g/dL (ref 30.0–36.0)
MCV: 96.3 fL (ref 80.0–100.0)
NRBC: 0.2 % (ref 0.0–0.2)
Platelets: 84 10*3/uL — ABNORMAL LOW (ref 150–400)
RBC: 4 MIL/uL (ref 3.87–5.11)
RDW: 12.9 % (ref 11.5–15.5)
WBC: 11.5 10*3/uL — ABNORMAL HIGH (ref 4.0–10.5)

## 2018-05-17 LAB — BASIC METABOLIC PANEL
Anion gap: 2 — ABNORMAL LOW (ref 5–15)
BUN: 15 mg/dL (ref 6–20)
CO2: 26 mmol/L (ref 22–32)
Calcium: 8 mg/dL — ABNORMAL LOW (ref 8.9–10.3)
Chloride: 112 mmol/L — ABNORMAL HIGH (ref 98–111)
Creatinine, Ser: 0.62 mg/dL (ref 0.44–1.00)
GFR calc Af Amer: >60 mL/min (ref >60)
GFR calc non Af Amer: >60 mL/min (ref >60)
Glucose, Bld: 159 mg/dL — ABNORMAL HIGH (ref 70–99)
Potassium: 4.1 mmol/L (ref 3.5–5.1)
Sodium: 140 mmol/L (ref 135–145)

## 2018-05-17 LAB — CULTURE, RESPIRATORY W GRAM STAIN: Culture: NORMAL

## 2018-05-17 LAB — GLUCOSE, CAPILLARY
Glucose-Capillary: 110 mg/dL — ABNORMAL HIGH (ref 70–99)
Glucose-Capillary: 137 mg/dL — ABNORMAL HIGH (ref 70–99)
Glucose-Capillary: 153 mg/dL — ABNORMAL HIGH (ref 70–99)
Glucose-Capillary: 155 mg/dL — ABNORMAL HIGH (ref 70–99)
Glucose-Capillary: 213 mg/dL — ABNORMAL HIGH (ref 70–99)
Glucose-Capillary: 222 mg/dL — ABNORMAL HIGH (ref 70–99)

## 2018-05-17 LAB — PATHOLOGIST SMEAR REVIEW

## 2018-05-17 LAB — CULTURE, RESPIRATORY: GRAM STAIN: NONE SEEN

## 2018-05-17 MED ORDER — INSULIN DETEMIR 100 UNIT/ML ~~LOC~~ SOLN
10.0000 [IU] | Freq: Two times a day (BID) | SUBCUTANEOUS | Status: DC
  Administered 2018-05-17 – 2018-05-22 (×9): 10 [IU] via SUBCUTANEOUS
  Filled 2018-05-17 (×11): qty 0.1

## 2018-05-17 MED ORDER — DEXTROSE 10 % IV SOLN: INTRAVENOUS | Status: DC

## 2018-05-17 MED ORDER — INSULIN ASPART 100 UNIT/ML ~~LOC~~ SOLN
2.0000 [IU] | SUBCUTANEOUS | Status: DC
  Administered 2018-05-17: 4 [IU] via SUBCUTANEOUS
  Administered 2018-05-17: 6 [IU] via SUBCUTANEOUS
  Administered 2018-05-18 (×3): 4 [IU] via SUBCUTANEOUS
  Administered 2018-05-18 – 2018-05-19 (×3): 2 [IU] via SUBCUTANEOUS
  Administered 2018-05-19: 4 [IU] via SUBCUTANEOUS
  Administered 2018-05-19 (×3): 2 [IU] via SUBCUTANEOUS
  Administered 2018-05-19 – 2018-05-20 (×2): 4 [IU] via SUBCUTANEOUS
  Administered 2018-05-20: 2 [IU] via SUBCUTANEOUS
  Administered 2018-05-20: 6 [IU] via SUBCUTANEOUS
  Administered 2018-05-20: 2 [IU] via SUBCUTANEOUS
  Filled 2018-05-17 (×17): qty 1

## 2018-05-17 MED ORDER — NOREPINEPHRINE BITARTRATE 1 MG/ML IV SOLN
0.0000 ug/min | INTRAVENOUS | Status: DC
  Administered 2018-05-18: 8 ug/min via INTRAVENOUS
  Administered 2018-05-18: 7 ug/min via INTRAVENOUS
  Administered 2018-05-19: 8 ug/min via INTRAVENOUS
  Administered 2018-05-20: 2 ug/min via INTRAVENOUS
  Administered 2018-05-21: 3 ug/min via INTRAVENOUS
  Administered 2018-05-21: 4 ug/min via INTRAVENOUS
  Administered 2018-05-22: 6 ug/min via INTRAVENOUS
  Filled 2018-05-17 (×8): qty 4

## 2018-05-17 MED ORDER — MAGNESIUM SULFATE 2 GM/50ML IV SOLN
2.0000 g | Freq: Once | INTRAVENOUS | Status: AC
  Administered 2018-05-17: 2 g via INTRAVENOUS
  Filled 2018-05-17: qty 50

## 2018-05-17 MED ORDER — INSULIN DETEMIR 100 UNIT/ML ~~LOC~~ SOLN
10.0000 [IU] | Freq: Two times a day (BID) | SUBCUTANEOUS | Status: DC
  Filled 2018-05-17 (×2): qty 0.1
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
## 2018-05-17 NOTE — Progress Notes (Signed)
PharmacyMonitoring Consult:  Pharmacy consulted to assist in monitoring and replacing electrolytes, constipation management, and blood glucose management in this 62 y.o. female admitted on 05/14/2018 with Hypotension  Patient is currently requiring mechanical ventilation and sedation regimen includes continuous fentanyl and propofol infusion. Tube feeds have been held.  Labs:  Sodium (mmol/L)  Date Value  05/17/2018 140  12/26/2013 138   Potassium (mmol/L)  Date Value  05/17/2018 4.1  12/26/2013 3.4 (L)   Magnesium (mg/dL)  Date Value  78/46/962912/18/2019 1.8   Phosphorus (mg/dL)  Date Value  52/84/132412/18/2019 2.6   Calcium (mg/dL)  Date Value  40/10/272512/18/2019 8.0 (L)   Calcium, Total (mg/dL)  Date Value  36/64/403407/29/2015 9.0   Albumin (g/dL)  Date Value  74/25/956312/16/2019 2.4 (L)  12/26/2013 3.6   Corrected calcium: 9.3  Assessment/Plan: 1. Electrolytes: Magnesium 2g IV x 1. Will obtain electrolytes with am labs. Will replace for goal potassium ~ 4, goal magnesium ~ 2, and goal phosphorus > 2.5.  2. Constipation: no bowel movement noted. Continue senna/docusate 2 tab VT BID. If no bowel movement by 12/19, will order docusate PR x 1.   3. Glucose: Will continue ICU Hyperglycemia Phase 3 and Levemir 10 units BID.   Pharmacy will continue to monitor and adjust per consult.   Deneene Tarver L 05/17/2018 3:04 PM
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
## 2018-05-17 NOTE — Progress Notes (Signed)
CRITICAL CARE NOTE  CC  resp failure  SUBJECTIVE Remains on vent Sedation off Unresponsive Concerned for acute CNS event but may be delerium      SIGNIFICANT EVENTS 12/16 Vent support, failed weaning trials 12/17 vent support, unable to wean due to neurological dysfunction  BP 108/74   Pulse (!) 107   Temp 98.8 F (37.1 C) (Oral)   Resp 14   Ht _0  (1.702 m)   Wt 89.2 kg   SpO2 100%   BMI 30.80 kg/m    REVIEW OF SYSTEMS  PATIENT IS UNABLE TO PROVIDE COMPLETE REVIEW OF SYSTEMS DUE TO SEVERE CRITICAL ILLNESS   PHYSICAL EXAMINATION:  GENERAL:critically ill appearing, +resp distress HEAD: Normocephalic, atraumatic.  EYES: Pupils equal, round, reactive to light.  No scleral icterus.  MOUTH: Moist mucosal membrane. NECK: Supple. No thyromegaly. No nodules. No JVD.  PULMONARY: +rhonchi CARDIOVASCULAR: S1 and S2. Regular rate and rhythm. No murmurs, rubs, or gallops.  GASTROINTESTINAL: Soft, nontender, -distended. No masses. Positive bowel sounds. No hepatosplenomegaly.  MUSCULOSKELETAL: No swelling, clubbing, or edema.  NEUROLOGIC: obtunded SKIN:intact,warm,dry        Indwelling Urinary Catheter continued, requirement due to   Reason to continue Indwelling Urinary Catheter for strict Intake/Output monitoring for hemodynamic instability   Central Line continued, requirement due to   Reason to continue [employer] Monitoring of central venous pressure or other hemodynamic parameters   Ventilator continued, requirement due to, resp failure    Ventilator Sedation RASS 0 to -2         ASSESSMENT AND PLAN SYNOPSIS   62 yo female with pneumonia/sepsis with severe resp failure with septic shock   Severe Hypoxic and Hypercapnic Respiratory Failure -continue Full MV support -continue Bronchodilator Therapy -Wean Fio2 and PEEP as tolerated -will perform SAT/SBTTwhen respiratory parameters are met  Renal Failure- -follow chem 7 -follow  UO -continue Foley Catheter-assess need    NEUROLOGY Off sedation plan for CT head today May consider MRI brain  Septic shock-resolving    GI/Nutrition GI PROPHYLAXIS as indicated DIET-->TF's as tolerated Constipation protocol as indicated  ELECTROLYTES -follow labs as needed -replace as needed -pharmacy consultation and following    DVT/GI PRX ordered TRANSFUSIONS AS NEEDED MONITOR FSBS ASSESS the need for LABS as needed    Critical Care Time devoted to patient care services described in this note is 34 minutes.   Overall, patient is critically ill, prognosis is guarded.  Patient with Multiorgan failure and at high risk for cardiac arrest and death.    palliative care team consulted, no family available at this time  Corrin Parker, M.D.  [facility]  Medical Director [place] Director [facility] Cardio-Pulmonary Department

## 2018-05-17 NOTE — Care Management Note (Signed)
Case Management Note  Patient Details  Name: Kaitlyn Ruiz MRN: [id number] Date of Birth: 30-Jun-1955  Subjective/Objective:    Patient admitted with acute respiratory failure requiring intubation.  Patient is currently in the ICU on the vent.  Family is at the bedside, patient has a daughter, the niece is visiting at the present.  Patient is from [facility] facility and has been there for 3 years or more.  RNCM will cont to follow patient progress. Kaitlyn LisJeanna Aviv Lengacher RN BSN [phone]                 Action/Plan:   Expected Discharge Date:                  Expected Discharge Plan:     In-House Referral:     Discharge planning Services     Post Acute Care Choice:    Choice offered to:     DME Arranged:    DME Agency:     HH Arranged:    HH Agency:     Status of Service:     If discussed at Long Length of Stay Meetings, dates discussed:    Additional Comments:  Kaitlyn ButcherJeanna M Ritha Sampedro, RN 05/17/2018, 3:21 PM

## 2018-05-18 ENCOUNTER — Inpatient Hospital Stay: Payer: Medicare Other

## 2018-05-18 LAB — CBC
HCT: 39.7 % (ref 36.0–46.0)
Hemoglobin: 12.9 g/dL (ref 12.0–15.0)
MCH: 31.3 pg (ref 26.0–34.0)
MCHC: 32.5 g/dL (ref 30.0–36.0)
MCV: 96.4 fL (ref 80.0–100.0)
Platelets: 107 10*3/uL — ABNORMAL LOW (ref 150–400)
RBC: 4.12 MIL/uL (ref 3.87–5.11)
RDW: 12.5 % (ref 11.5–15.5)
WBC: 12.3 10*3/uL — ABNORMAL HIGH (ref 4.0–10.5)
nRBC: 0 % (ref 0.0–0.2)

## 2018-05-18 LAB — CULTURE, BLOOD (ROUTINE X 2): SPECIAL REQUESTS: ADEQUATE

## 2018-05-18 LAB — GLUCOSE, CAPILLARY
Glucose-Capillary: 120 mg/dL — ABNORMAL HIGH (ref 70–99)
Glucose-Capillary: 140 mg/dL — ABNORMAL HIGH (ref 70–99)
Glucose-Capillary: 148 mg/dL — ABNORMAL HIGH (ref 70–99)
Glucose-Capillary: 173 mg/dL — ABNORMAL HIGH (ref 70–99)
Glucose-Capillary: 175 mg/dL — ABNORMAL HIGH (ref 70–99)
Glucose-Capillary: 177 mg/dL — ABNORMAL HIGH (ref 70–99)

## 2018-05-18 LAB — ECHOCARDIOGRAM COMPLETE
Height: 67 in
Weight: 3146.41 oz

## 2018-05-18 LAB — BASIC METABOLIC PANEL
Anion gap: 4 — ABNORMAL LOW (ref 5–15)
BUN: 11 mg/dL (ref 6–20)
CO2: 27 mmol/L (ref 22–32)
Calcium: 8.2 mg/dL — ABNORMAL LOW (ref 8.9–10.3)
Chloride: 106 mmol/L (ref 98–111)
Creatinine, Ser: 0.62 mg/dL (ref 0.44–1.00)
GFR calc Af Amer: >60 mL/min (ref >60)
GFR calc non Af Amer: >60 mL/min (ref >60)
Glucose, Bld: 175 mg/dL — ABNORMAL HIGH (ref 70–99)
Potassium: 3.8 mmol/L (ref 3.5–5.1)
Sodium: 137 mmol/L (ref 135–145)

## 2018-05-18 LAB — MAGNESIUM: Magnesium: 1.9 mg/dL (ref 1.7–2.4)

## 2018-05-18 MED ORDER — POTASSIUM CHLORIDE 20 MEQ PO PACK: 40.0000 meq | PACK | Freq: Once | ORAL | Status: DC

## 2018-05-18 MED ORDER — MAGNESIUM SULFATE IN D5W 1-5 GM/100ML-% IV SOLN
1.0000 g | Freq: Once | INTRAVENOUS | Status: AC
  Administered 2018-05-18: 1 g via INTRAVENOUS
  Filled 2018-05-18: qty 100

## 2018-05-18 MED ORDER — GLYCOPYRROLATE 0.2 MG/ML IJ SOLN
0.2000 mg | Freq: Three times a day (TID) | INTRAMUSCULAR | Status: DC
  Administered 2018-05-18 – 2018-05-22 (×13): 0.2 mg via SUBCUTANEOUS
  Filled 2018-05-18 (×13): qty 1

## 2018-05-18 MED ORDER — SODIUM CHLORIDE 0.9 % IV SOLN
2.0000 g | INTRAVENOUS | Status: AC
  Administered 2018-05-18 – 2018-05-22 (×5): 2 g via INTRAVENOUS
  Filled 2018-05-18 (×3): qty 2
  Filled 2018-05-18: qty 20
  Filled 2018-05-18: qty 2

## 2018-05-18 MED ORDER — POTASSIUM CHLORIDE 20 MEQ PO PACK
20.0000 meq | PACK | Freq: Once | ORAL | Status: AC
  Administered 2018-05-18: 20 meq
  Filled 2018-05-18: qty 1

## 2018-05-18 NOTE — Progress Notes (Signed)
CRITICAL CARE NOTE  CC  resp failure  SUBJECTIVE On vent On sedation Wean sedation today Remains critically ill Remains obtunded Failure to wean from vent      SIGNIFICANT EVENTS 12/16 Vent support, failed weaning trials 12/17 vent support, unable to wean due to neurological dysfunction 12/18 failure to wean from vent, neurological dysfunction  BP (!) 122/92   Pulse (!) 113   Temp 98.4 F (36.9 C) (Oral)   Resp 20   Ht '5\' 7"'  (1.702 m)   Wt 88 kg   SpO2 100%   BMI 30.39 kg/m    REVIEW OF SYSTEMS  PATIENT IS UNABLE TO PROVIDE COMPLETE REVIEW OF SYSTEMS DUE TO SEVERE CRITICAL ILLNESS   PHYSICAL EXAMINATION:  GENERAL:critically ill appearing, +resp distress HEAD: Normocephalic, atraumatic.  EYES: Pupils equal, round, reactive to light.  No scleral icterus.  MOUTH: Moist mucosal membrane. NECK: Supple. No thyromegaly. No nodules. No JVD.  PULMONARY: +rhonchi, +wheezing CARDIOVASCULAR: S1 and S2. Regular rate and rhythm. No murmurs, rubs, or gallops.  GASTROINTESTINAL: Soft, nontender, -distended. No masses. Positive bowel sounds. No hepatosplenomegaly.  MUSCULOSKELETAL: No swelling, clubbing, or edema.  NEUROLOGIC: obtunded SKIN:intact,warm,dry            Indwelling Urinary Catheter continued, requirement due to   Reason to continue Indwelling Urinary Catheter for strict Intake/Output monitoring for hemodynamic instability   Central Line continued, requirement due to   Reason to continue [employer] Monitoring of central venous pressure or other hemodynamic parameters   Ventilator continued, requirement due to, resp failure    Ventilator Sedation RASS 0 to -2         ASSESSMENT AND PLAN SYNOPSIS   62 yo female with pneumonia/sepsis with severe resp failure with septic shock   Severe Hypoxic and Hypercapnic Respiratory Failure -continue Mechanical Ventilator support -continue Bronchodilator Therapy -Wean Fio2 and PEEP as  tolerated -VAP/VENT bundle implementation -will perform SAT/SBT when respiratory parameters are met   Renal Failure- -follow chem 7 -follow UO -continue Foley Catheter-assess need   NEUROLOGY Wean off sedation Obtain MRI brain   Septic shock-resolving     GI/Nutrition GI PROPHYLAXIS as indicated DIET-->TF's as tolerated Constipation protocol as indicated   ELECTROLYTES -follow labs as needed -replace as needed -pharmacy consultation and following      DVT/GI PRX ordered TRANSFUSIONS AS NEEDED MONITOR FSBS ASSESS the need for LABS as needed      Critical Care Time devoted to patient care services described in this note is 32 minutes.   Overall, patient is critically ill, prognosis is guarded.  Patient with Multiorgan failure and at high risk for cardiac arrest and death.    Corrin Parker, M.D.  [facility]  Medical Director [place] Director [facility] Cardio-Pulmonary Department

## 2018-05-18 NOTE — Progress Notes (Signed)
Spoke with bedside RN and CCM.  Examined patient. No family at bedside today. Left message for sister (co-HCPOA).  Awaiting return call.   Will continue to follow with you and attempt to establish GOC with her sister and Uncle.  Norvel RichardsMarianne Dorrie Cocuzza, PA-C Palliative Medicine Pager: [phone]
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
## 2018-05-18 NOTE — Progress Notes (Signed)
PharmacyMonitoring Consult:  Pharmacy consulted to assist in monitoring and replacing electrolytes, constipation management, and blood glucose management in this 62 y.o. female admitted on 05/14/2018 with Hypotension  Patient is currently requiring mechanical ventilation and sedation regimen includes continuous fentanyl and propofol infusion. Tube feeds have been held.  Labs:  Sodium (mmol/L)  Date Value  05/18/2018 137  12/26/2013 138   Potassium (mmol/L)  Date Value  05/18/2018 3.8  12/26/2013 3.4 (L)   Magnesium (mg/dL)  Date Value  95/62/130812/19/2019 1.9   Phosphorus (mg/dL)  Date Value  65/78/469612/18/2019 2.6   Calcium (mg/dL)  Date Value  29/52/841312/19/2019 8.2 (L)   Calcium, Total (mg/dL)  Date Value  24/40/102707/29/2015 9.0   Albumin (g/dL)  Date Value  25/36/644012/16/2019 2.4 (L)  12/26/2013 3.6   Corrected calcium: 9.3  Assessment/Plan: 1. Electrolytes: Magnesium 1g IV x 1 and potassium 20mEq VT x 1.  Will obtain electrolytes with am labs. Will replace for goal potassium ~ 4, goal magnesium ~ 2, and goal phosphorus > 2.5.  2. Constipation: no bowel movement noted. Continue senna/docusate 2 tab VT BID. Miralax VT x 1.   3. Glucose: Will continue ICU Hyperglycemia Phase 3 and Levemir 10 units BID.   Pharmacy will continue to monitor and adjust per consult.   Daralyn Bert L 05/18/2018 8:45 AM

## 2018-05-19 DIAGNOSIS — R2990 Unspecified symptoms and signs involving the nervous system: Secondary | ICD-10-CM

## 2018-05-19 LAB — CULTURE, BLOOD (ROUTINE X 2)
Culture: NO GROWTH
Special Requests: ADEQUATE

## 2018-05-19 LAB — BASIC METABOLIC PANEL
Anion gap: 4 — ABNORMAL LOW (ref 5–15)
BUN: 8 mg/dL (ref 6–20)
CO2: 27 mmol/L (ref 22–32)
Calcium: 7.8 mg/dL — ABNORMAL LOW (ref 8.9–10.3)
Chloride: 105 mmol/L (ref 98–111)
Creatinine, Ser: 0.58 mg/dL (ref 0.44–1.00)
GFR calc Af Amer: >60 mL/min (ref >60)
GFR calc non Af Amer: >60 mL/min (ref >60)
Glucose, Bld: 181 mg/dL — ABNORMAL HIGH (ref 70–99)
Potassium: 4 mmol/L (ref 3.5–5.1)
Sodium: 136 mmol/L (ref 135–145)

## 2018-05-19 LAB — GLUCOSE, CAPILLARY
Glucose-Capillary: 136 mg/dL — ABNORMAL HIGH (ref 70–99)
Glucose-Capillary: 147 mg/dL — ABNORMAL HIGH (ref 70–99)
Glucose-Capillary: 148 mg/dL — ABNORMAL HIGH (ref 70–99)
Glucose-Capillary: 152 mg/dL — ABNORMAL HIGH (ref 70–99)
Glucose-Capillary: 172 mg/dL — ABNORMAL HIGH (ref 70–99)
Glucose-Capillary: 210 mg/dL — ABNORMAL HIGH (ref 70–99)

## 2018-05-19 LAB — CBC
HCT: 35.9 % — ABNORMAL LOW (ref 36.0–46.0)
Hemoglobin: 11.9 g/dL — ABNORMAL LOW (ref 12.0–15.0)
MCH: 31.4 pg (ref 26.0–34.0)
MCHC: 33.1 g/dL (ref 30.0–36.0)
MCV: 94.7 fL (ref 80.0–100.0)
Platelets: 122 10*3/uL — ABNORMAL LOW (ref 150–400)
RBC: 3.79 MIL/uL — ABNORMAL LOW (ref 3.87–5.11)
RDW: 12.6 % (ref 11.5–15.5)
WBC: 11.1 10*3/uL — ABNORMAL HIGH (ref 4.0–10.5)
nRBC: 0 % (ref 0.0–0.2)

## 2018-05-19 MED ORDER — BISACODYL 10 MG RE SUPP
10.0000 mg | Freq: Once | RECTAL | Status: AC
  Administered 2018-05-20: 10 mg via RECTAL
  Filled 2018-05-19: qty 1
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
## 2018-05-19 NOTE — Progress Notes (Signed)
PharmacyMonitoring Consult:  Pharmacy consulted to assist in monitoring and replacing electrolytes, constipation management, and blood glucose management in this 62 y.o. female admitted on 05/14/2018 with Hypotension  Patient is currently requiring mechanical ventilation and sedation regimen includes continuous fentanyl and propofol infusion. Tube feeds are currently infusing at 6250mL/hr.   Labs:  Sodium (mmol/L)  Date Value  05/19/2018 136  12/26/2013 138   Potassium (mmol/L)  Date Value  05/19/2018 4.0  12/26/2013 3.4 (L)   Magnesium (mg/dL)  Date Value  16/10/960412/19/2019 1.9   Phosphorus (mg/dL)  Date Value  54/09/811912/18/2019 2.6   Calcium (mg/dL)  Date Value  14/78/295612/20/2019 7.8 (L)   Calcium, Total (mg/dL)  Date Value  21/30/865707/29/2015 9.0   Albumin (g/dL)  Date Value  84/69/629512/16/2019 2.4 (L)  12/26/2013 3.6   Corrected calcium: 9.3  Assessment/Plan: 1. Electrolytes: No replacement warranted at his time. Unless otherwise clinically indicated, will obtain electrolytes with am labs on 12/22. Will replace for goal potassium ~ 4, goal magnesium ~ 2, and goal phosphorus > 2.5.  2. Constipation: Continue senna/docusate 2 tab VT BID. Miralax VT daily. Will add bisacodyl suppository x 1.   3. Glucose: Will continue ICU Hyperglycemia Phase 3 and Levemir 10 units BID.   Pharmacy will continue to monitor and adjust per consult.   Sidharth Leverette L 05/19/2018 1:53 PM

## 2018-05-19 NOTE — Progress Notes (Signed)
CRITICAL CARE NOTE  CC  Severe resp failure  SUBJECTIVE  On vent On sedation Critically ill Remains obtunded Failure to wean from vent      SIGNIFICANT EVENTS 12/16 Vent support, failed weaning trials 12/17 vent support, unable to wean due to neurological dysfunction 12/18 failure to wean from vent, neurological dysfunction 12/19 poor neuro dysfunction, unable to wean   BP (!) 87/61   Pulse 88   Temp 98.8 F (37.1 C) (Oral)   Resp 14   Ht '5\' 7"'  (1.702 m)   Wt 87.3 kg   SpO2 100%   BMI 30.14 kg/m    REVIEW OF SYSTEMS  PATIENT IS UNABLE TO PROVIDE COMPLETE REVIEW OF SYSTEMS DUE TO SEVERE CRITICAL ILLNESS   PHYSICAL EXAMINATION:  GENERAL:critically ill appearing, +resp distress HEAD: Normocephalic, atraumatic.  EYES: Pupils equal, round, reactive to light.  No scleral icterus.  MOUTH: Moist mucosal membrane. NECK: Supple. No thyromegaly. No nodules. No JVD.  PULMONARY: +rhonchi, +wheezing CARDIOVASCULAR: S1 and S2. Regular rate and rhythm. No murmurs, rubs, or gallops.  GASTROINTESTINAL: Soft, nontender, -distended. No masses. Positive bowel sounds. No hepatosplenomegaly.  MUSCULOSKELETAL: No swelling, clubbing, or edema.  NEUROLOGIC: obtunded SKIN:intact,warm,dry          Indwelling Urinary Catheter continued, requirement due to   Reason to continue Indwelling Urinary Catheter for strict Intake/Output monitoring for hemodynamic instability   Central Line continued, requirement due to   Reason to continue [employer] Monitoring of central venous pressure or other hemodynamic parameters   Ventilator continued, requirement due to, resp failure    Ventilator Sedation RASS 0 to -2         ASSESSMENT AND PLAN SYNOPSIS   62 yo female with pneumonia/sepsis with severe resp failure with septic shock Failure to wean inability to protect airway  Severe Hypoxic and Hypercapnic Respiratory Failure-not improving -continue Mechanical Ventilator  support -continue Bronchodilator Therapy -Wean Fio2 and PEEP as tolerated -VAP/VENT bundle implementation -will perform SAT/SBT when respiratory parameters are met  Renal Failure- -follow chem 7 -follow UO -continue Foley Catheter-assess need    NEUROLOGY Baseline neurology functyion is very poor Recommend one way extubation  Septic shock-resolving   GI/Nutrition GI PROPHYLAXIS as indicated DIET-->TF's as tolerated Constipation protocol as indicated  ELECTROLYTES -follow labs as needed -replace as needed -pharmacy consultation and following    DVT/GI PRX ordered TRANSFUSIONS AS NEEDED MONITOR FSBS ASSESS the need for LABS as needed     Critical Care Time devoted to patient care services described in this note is 34 minutes.   Overall, patient is critically ill, prognosis is guarded.  Patient with Multiorgan failure and at high risk for cardiac arrest and death.   Palliative care consulted   Kaitlyn Ruiz, M.D.  [facility] Ruiz [facility]  Medical Director [place] Director [facility] Cardio-Pulmonary Department
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
## 2018-05-20 ENCOUNTER — Inpatient Hospital Stay: Payer: Medicare Other

## 2018-05-20 DIAGNOSIS — R2990 Unspecified symptoms and signs involving the nervous system: Secondary | ICD-10-CM

## 2018-05-20 LAB — CBC
HCT: 36.4 % (ref 36.0–46.0)
Hemoglobin: 11.7 g/dL — ABNORMAL LOW (ref 12.0–15.0)
MCH: 31 pg (ref 26.0–34.0)
MCHC: 32.1 g/dL (ref 30.0–36.0)
MCV: 96.3 fL (ref 80.0–100.0)
Platelets: 127 10*3/uL — ABNORMAL LOW (ref 150–400)
RBC: 3.78 MIL/uL — ABNORMAL LOW (ref 3.87–5.11)
RDW: 12.9 % (ref 11.5–15.5)
WBC: 13.2 10*3/uL — AB (ref 4.0–10.5)
nRBC: 0 % (ref 0.0–0.2)

## 2018-05-20 LAB — PHOSPHORUS: Phosphorus: 3.6 mg/dL (ref 2.5–4.6)

## 2018-05-20 LAB — GLUCOSE, CAPILLARY
Glucose-Capillary: 128 mg/dL — ABNORMAL HIGH (ref 70–99)
Glucose-Capillary: 143 mg/dL — ABNORMAL HIGH (ref 70–99)
Glucose-Capillary: 181 mg/dL — ABNORMAL HIGH (ref 70–99)
Glucose-Capillary: 137 mg/dL — ABNORMAL HIGH (ref 70–99)
Glucose-Capillary: 81 mg/dL (ref 70–99)

## 2018-05-20 LAB — MAGNESIUM: Magnesium: 1.7 mg/dL (ref 1.7–2.4)

## 2018-05-20 MED ORDER — ONDANSETRON HCL 4 MG/2ML IJ SOLN
4.0000 mg | Freq: Four times a day (QID) | INTRAMUSCULAR | Status: DC | PRN
  Administered 2018-05-20: 4 mg via INTRAVENOUS

## 2018-05-20 MED ORDER — ONDANSETRON HCL 4 MG/2ML IJ SOLN
INTRAMUSCULAR | Status: AC
  Administered 2018-05-20: 4 mg via INTRAVENOUS
  Filled 2018-05-20: qty 2

## 2018-05-20 MED ORDER — INSULIN ASPART 100 UNIT/ML ~~LOC~~ SOLN
2.0000 [IU] | SUBCUTANEOUS | Status: DC
  Administered 2018-05-20 – 2018-05-21 (×3): 2 [IU] via SUBCUTANEOUS
  Filled 2018-05-20 (×3): qty 1

## 2018-05-20 MED ORDER — SODIUM CHLORIDE 0.9 % IV BOLUS
500.0000 mL | Freq: Once | INTRAVENOUS | Status: AC
  Administered 2018-05-20: 500 mL via INTRAVENOUS

## 2018-05-20 MED ORDER — MAGNESIUM SULFATE IN D5W 1-5 GM/100ML-% IV SOLN
1.0000 g | Freq: Once | INTRAVENOUS | Status: AC
  Administered 2018-05-20: 1 g via INTRAVENOUS
  Filled 2018-05-20 (×2): qty 100

## 2018-05-20 NOTE — Progress Notes (Signed)
Notified Dr. Belia HemanKasa- that patient started vomiting- zofran given and tube feeds stopped at this time.   Orders to hook OG up to LIS.

## 2018-05-20 NOTE — Progress Notes (Signed)
PharmacyMonitoring Consult:  Pharmacy consulted to assist in monitoring and replacing electrolytes, constipation management, and blood glucose management in this 62 y.o. female admitted on 05/14/2018 with Hypotension  Patient is currently requiring mechanical ventilation and sedation regimen includes continuous fentanyl and propofol infusion. Tube feeds are currently infusing at 9150mL/hr.   Labs:  Sodium (mmol/L)  Date Value  05/19/2018 136  12/26/2013 138   Potassium (mmol/L)  Date Value  05/19/2018 4.0  12/26/2013 3.4 (L)   Magnesium (mg/dL)  Date Value  16/10/960412/21/2019 1.7   Phosphorus (mg/dL)  Date Value  54/09/811912/21/2019 3.6   Calcium (mg/dL)  Date Value  14/78/295612/20/2019 7.8 (L)   Calcium, Total (mg/dL)  Date Value  21/30/865707/29/2015 9.0   Albumin (g/dL)  Date Value  84/69/629512/16/2019 2.4 (L)  12/26/2013 3.6   Corrected calcium: 9.3  Assessment/Plan: 1. Electrolytes: Mag 1.7 Phos 3.6  Scr 12/20=0.58  Will order Magnesium sulfate 1 gram IV x 1.  Will replace for goal potassium ~ 4, goal magnesium ~ 2, and goal phosphorus > 2.5. F/u electrolytes  2. Constipation: Continue senna/docusate 2 tab VT BID. Miralax VT daily. + BM on 12/21 (under pain management tab)  3. Glucose: Will continue ICU Hyperglycemia Phase 3 and Levemir 10 units BID.   Pharmacy will continue to monitor and adjust per consult.   Kaitlyn Ruiz A 05/20/2018 11:13 AM
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
## 2018-05-20 NOTE — Progress Notes (Signed)
CRITICAL CARE NOTE  CC  Severe resp failure  SUBJECTIVE Intubated on vent Full  Vent support Failed multiple wean attempts Critically ill  Severe neurological dysfunction Severe cognitive dysfunction +resp muscle weakness      SIGNIFICANT EVENTS 12/16 Vent support, failed weaning trials 12/17 vent support, unable to wean due to neurological dysfunction 12/18 failure to wean from vent, neurological dysfunction 12/19 poor neuro dysfunction, unable to wean 12/20 failed SAT/SBT, unable to wean  BP (!) 78/58   Pulse (!) 116   Temp 98.1 F (36.7 C) (Oral)   Resp 14   Ht '5\' 7"'  (1.702 m)   Wt 91.2 kg   SpO2 94%   BMI 31.49 kg/m    REVIEW OF SYSTEMS  PATIENT IS UNABLE TO PROVIDE COMPLETE REVIEW OF SYSTEMS DUE TO SEVERE CRITICAL ILLNESS   PHYSICAL EXAMINATION:  GENERAL:critically ill appearing, +resp distress HEAD: Normocephalic, atraumatic.  EYES: Pupils equal, round, reactive to light.  No scleral icterus.  MOUTH: Moist mucosal membrane. NECK: Supple. No thyromegaly. No nodules. No JVD.  PULMONARY: +rhonchi, +wheezing CARDIOVASCULAR: S1 and S2. Regular rate and rhythm. No murmurs, rubs, or gallops.  GASTROINTESTINAL: Soft, nontender, -distended. No masses. Positive bowel sounds. No hepatosplenomegaly.  MUSCULOSKELETAL: No swelling, clubbing, or edema.  NEUROLOGIC: obtunded SKIN:intact,warm,dry           Indwelling Urinary Catheter continued, requirement due to   Reason to continue Indwelling Urinary Catheter for strict Intake/Output monitoring for hemodynamic instability   Central Line continued, requirement due to   Reason to continue [employer] Monitoring of central venous pressure or other hemodynamic parameters   Ventilator continued, requirement due to, resp failure    Ventilator Sedation RASS 0 to -2         ASSESSMENT AND PLAN SYNOPSIS   62 yo female with pneumonia/sepsis with severe resp failure with septic shock Failure to  wean inability to protect airway   Severe Hypoxic and Hypercapnic Respiratory Failure -continue Mechanical Ventilator support -continue Bronchodilator Therapy -Wean Fio2 and PEEP as tolerated -VAP/VENT bundle implementation -will perform SAT/SBT when respiratory parameters are met Unable to wean vent, resp muscleweakness  Renal Failure- -follow chem 7 -follow UO -continue Foley Catheter-assess need   NEUROLOGY - intubated and sedated - minimal sedation to achieve a RASS goal: -1 Severe cognitive and neurological dysfunction at baseline     GI/Nutrition GI PROPHYLAXIS as indicated DIET-->TF's as tolerated Constipation protocol as indicated  ELECTROLYTES -follow labs as needed -replace as needed -pharmacy consultation and following    DVT/GI PRX ordered TRANSFUSIONS AS NEEDED MONITOR FSBS ASSESS the need for LABS as needed     Critical Care Time devoted to patient care services described in this note is 32 minutes.   Overall, patient is critically ill, prognosis is guarded.  Patient with Multiorgan failure and at high risk for cardiac arrest and death.    No family available to make decisions Palliative care team consulted Recommend DNR status and one way extubation Patient unable to protect airway   Montario Zilka Patricia Pesa, M.D.  [facility]  Medical Director [place] Director [facility] Cardio-Pulmonary Department

## 2018-05-20 NOTE — Progress Notes (Signed)
Spoke with Patriciaann ClanM. Turkov, CNP (face to face) about patient's decreased urine output, tachycardia, and hypotension.  Was ordered to administer 500 ml normal saline bolus.  If patient's blood pressure unresponsive, restart levophed drip.

## 2018-05-21 LAB — BASIC METABOLIC PANEL
Anion gap: 2 — ABNORMAL LOW (ref 5–15)
BUN: 13 mg/dL (ref 6–20)
CALCIUM: 8.1 mg/dL — AB (ref 8.9–10.3)
CO2: 31 mmol/L (ref 22–32)
Chloride: 102 mmol/L (ref 98–111)
Creatinine, Ser: 0.68 mg/dL (ref 0.44–1.00)
GFR calc Af Amer: >60 mL/min (ref >60)
GFR calc non Af Amer: >60 mL/min (ref >60)
Glucose, Bld: 116 mg/dL — ABNORMAL HIGH (ref 70–99)
Potassium: 4.1 mmol/L (ref 3.5–5.1)
Sodium: 135 mmol/L (ref 135–145)

## 2018-05-21 LAB — GLUCOSE, CAPILLARY
Glucose-Capillary: 102 mg/dL — ABNORMAL HIGH (ref 70–99)
Glucose-Capillary: 108 mg/dL — ABNORMAL HIGH (ref 70–99)
Glucose-Capillary: 112 mg/dL — ABNORMAL HIGH (ref 70–99)
Glucose-Capillary: 113 mg/dL — ABNORMAL HIGH (ref 70–99)
Glucose-Capillary: 121 mg/dL — ABNORMAL HIGH (ref 70–99)
Glucose-Capillary: 130 mg/dL — ABNORMAL HIGH (ref 70–99)
Glucose-Capillary: 140 mg/dL — ABNORMAL HIGH (ref 70–99)
Glucose-Capillary: 62 mg/dL — ABNORMAL LOW (ref 70–99)

## 2018-05-21 MED ORDER — DEXTROSE 50 % IV SOLN
1.0000 | Freq: Once | INTRAVENOUS | Status: AC
  Administered 2018-05-21: 50 mL via INTRAVENOUS
  Filled 2018-05-21: qty 50

## 2018-05-21 MED ORDER — METOCLOPRAMIDE HCL 5 MG/ML IJ SOLN
10.0000 mg | Freq: Four times a day (QID) | INTRAMUSCULAR | Status: DC
  Administered 2018-05-21 – 2018-05-22 (×5): 10 mg via INTRAVENOUS
  Filled 2018-05-21 (×5): qty 2

## 2018-05-21 NOTE — Progress Notes (Signed)
PharmacyMonitoring Consult:  Pharmacy consulted to assist in monitoring and replacing electrolytes, constipation management, and blood glucose management in this 62 y.o. female admitted on 05/14/2018 with Hypotension  Patient is currently requiring mechanical ventilation and sedation regimen includes continuous fentanyl and propofol infusion. Tube feeds are currently infusing at 7150mL/hr.   Labs:  Sodium (mmol/L)  Date Value  05/21/2018 135  12/26/2013 138   Potassium (mmol/L)  Date Value  05/21/2018 4.1  12/26/2013 3.4 (L)   Magnesium (mg/dL)  Date Value  16/10/960412/21/2019 1.7   Phosphorus (mg/dL)  Date Value  54/09/811912/21/2019 3.6   Calcium (mg/dL)  Date Value  14/78/295612/22/2019 8.1 (L)   Calcium, Total (mg/dL)  Date Value  21/30/865707/29/2015 9.0   Albumin (g/dL)  Date Value  84/69/629512/16/2019 2.4 (L)  12/26/2013 3.6   Corrected calcium: 9.3  Assessment/Plan: 1. Electrolytes: K 4.1  Scr 0.68  No supplementation at this time. Will replace for goal potassium ~ 4, goal magnesium ~ 2, and goal phosphorus > 2.5. F/u electrolytes  2. Constipation: Continue senna/docusate 2 tab VT BID. Miralax VT daily. + BM on 12/21 (noted under pain management tab)  3. Glucose: Will continue ICU Hyperglycemia Phase 3 and Levemir 10 units BID.   Pharmacy will continue to monitor and adjust per consult.   Jette Lewan A 05/21/2018 8:54 AM

## 2018-05-21 NOTE — Progress Notes (Signed)
CRITICAL CARE NOTE  CC  follow up resp failure  SUBJECTIVE On vent Intubated Vent support Severe cognitive dysfunction Critically ill +resp muscle weakness      SIGNIFICANT EVENTS 12/16 Vent support, failed weaning trials 12/17 vent support, unable to wean due to neurological dysfunction 12/18 failure to wean from vent, neurological dysfunction 12/19 poor neuro dysfunction, unable to wean 12/20 failed SAT/SBT, unable to wean 12/21 failed SAt/SBT encephalopathy, waiting for family  BP (!) 88/63   Pulse 95   Temp 99.2 F (37.3 C) (Axillary)   Resp 14   Ht 5\' 7"  (1.702 m)   Wt 91.2 kg   SpO2 100%   BMI 31.49 kg/m    REVIEW OF SYSTEMS  PATIENT IS UNABLE TO PROVIDE COMPLETE REVIEW OF SYSTEMS DUE TO SEVERE CRITICAL ILLNESS  PHYSICAL EXAMINATION:  GENERAL:critically ill appearing, +resp distress HEAD: Normocephalic, atraumatic.  EYES: Pupils equal, round, reactive to light.  No scleral icterus.  MOUTH: Moist mucosal membrane. NECK: Supple. No thyromegaly. No nodules. No JVD.  PULMONARY: +rhonchi, +wheezing CARDIOVASCULAR: S1 and S2. Regular rate and rhythm. No murmurs, rubs, or gallops.  GASTROINTESTINAL: Soft, nontender, -distended. No masses. Positive bowel sounds. No hepatosplenomegaly.  MUSCULOSKELETAL: No swelling, clubbing, or edema.  NEUROLOGIC: obtunded SKIN:intact,warm,dry          Indwelling Urinary Catheter continued, requirement due to   Reason to continue Indwelling Urinary Catheter for strict Intake/Output monitoring for hemodynamic instability   Central Line continued, requirement due to   Reason to continue [employer] Monitoring of central venous pressure or other hemodynamic parameters   Ventilator continued, requirement due to, resp failure    Ventilator Sedation RASS 0 to -2        ASSESSMENT AND PLAN SYNOPSIS   62 yo female with pneumonia/sepsis with severe resp failure with septic shock Failure to wean inability to  protect airway  Severe Hypoxic and Hypercapnic Respiratory Failure -continue Mechanical Ventilator support -continue Bronchodilator Therapy -Wean Fio2 and PEEP as tolerated -VAP/VENT bundle implementation Unable to wean, resp muscle weakness, severe encephlopathy   NEUROLOGY - intubated  Off sedation  persistent vegetative state/severe encephalopathy Unable to protect airway    GI/Nutrition GI PROPHYLAXIS as indicated DIET-->TF's as tolerated Constipation protocol as indicated  ELECTROLYTES -follow labs as needed -replace as needed -pharmacy consultation and following    DVT/GI PRX ordered TRANSFUSIONS AS NEEDED MONITOR FSBS ASSESS the need for LABS as needed    Critical Care Time devoted to patient care services described in this note is 32 minutes.   Overall, patient is critically ill, prognosis is guarded.  Patient with Multiorgan failure and at high risk for cardiac arrest and death.   No family available to make decisions Palliative care team consulted Recommend DNR status and one way extubation Patient unable to protect airway  Jaasiel Hollyfield Santiago Gladavid Rexford Prevo, M.D.  [facility]  Medical Director [facility] Medical Director [facility] Cardio-Pulmonary Department

## 2018-05-21 NOTE — Progress Notes (Signed)
No sedation interruption per Dr Belia HemanKasa. Restarted tube feeds at trickle dose 10 ml/hr Per Kasa. Levophed titrated to maintain map goal of 65. Minimal output. No family at bedside throughout the day.

## 2018-05-21 NOTE — Progress Notes (Signed)
I spoke with patient's sister Kaitlyn Ruiz on the phone on Saturday 12/21.  I encouraged her to speak to the CCM MD for the most current information as I was not at work.    I explained that Kaitlyn Ruiz's sepsis has cleared but she is unable to wean due to neurological decline.  I offered a one way extubation to comfort vs trach/PEG vent/SNF.  I explained that if Kaitlyn Ruiz underwent 1 way extubation she may live hours to days.  If she received a trach/PEG she would likely live  months to a couple of years in a facility connected to a machine and likely experiencing recurrent infections and pneumonia.    Kaitlyn Ruiz commented that nether option was good.  She seemed very torn about having to make such a difficult decision.  I provided therapeutic listening and encouraged her to discuss it with her family and the CCM doctors.  Kaitlyn RichardsMarianne Niomie Englert, PA-C Palliative Medicine Pager: [phone]  No charge note.
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
## 2018-05-22 DIAGNOSIS — Z978 Presence of other specified devices: Secondary | ICD-10-CM

## 2018-05-22 LAB — BASIC METABOLIC PANEL
ANION GAP: 6 (ref 5–15)
BUN: 10 mg/dL (ref 6–20)
CHLORIDE: 99 mmol/L (ref 98–111)
CO2: 30 mmol/L (ref 22–32)
Calcium: 7.9 mg/dL — ABNORMAL LOW (ref 8.9–10.3)
Creatinine, Ser: 0.59 mg/dL (ref 0.44–1.00)
GFR calc Af Amer: >60 mL/min (ref >60)
GFR calc non Af Amer: >60 mL/min (ref >60)
Glucose, Bld: 156 mg/dL — ABNORMAL HIGH (ref 70–99)
POTASSIUM: 3.9 mmol/L (ref 3.5–5.1)
Sodium: 135 mmol/L (ref 135–145)

## 2018-05-22 LAB — BLOOD GAS, ARTERIAL
Acid-base deficit: 2.1 mmol/L — ABNORMAL HIGH (ref 0.0–2.0)
Bicarbonate: 20.9 mmol/L (ref 20.0–28.0)
FIO2: 0.3
LHR: 16 {breaths}/min
MECHVT: 500 mL
Mechanical Rate: 16
O2 Saturation: 98.5 %
PATIENT TEMPERATURE: 37
PEEP: 5 cmH2O
pCO2 arterial: 30 mmHg — ABNORMAL LOW (ref 32.0–48.0)
pH, Arterial: 7.45 (ref 7.350–7.450)
pO2, Arterial: 110 mmHg — ABNORMAL HIGH (ref 83.0–108.0)

## 2018-05-22 LAB — GLUCOSE, CAPILLARY
Glucose-Capillary: 105 mg/dL — ABNORMAL HIGH (ref 70–99)
Glucose-Capillary: 114 mg/dL — ABNORMAL HIGH (ref 70–99)
Glucose-Capillary: 112 mg/dL — ABNORMAL HIGH (ref 70–99)
Glucose-Capillary: 121 mg/dL — ABNORMAL HIGH (ref 70–99)
Glucose-Capillary: 91 mg/dL (ref 70–99)
Glucose-Capillary: 150 mg/dL — ABNORMAL HIGH (ref 70–99)

## 2018-05-22 LAB — CULTURE, BLOOD (ROUTINE X 2)
Culture: NO GROWTH
Culture: NO GROWTH
Special Requests: ADEQUATE

## 2018-05-22 LAB — MAGNESIUM: Magnesium: 1.8 mg/dL (ref 1.7–2.4)

## 2018-05-22 MED ORDER — FENTANYL CITRATE (PF) 100 MCG/2ML IJ SOLN
25.0000 ug | INTRAMUSCULAR | Status: DC | PRN
  Administered 2018-05-23: 50 ug via INTRAVENOUS
  Filled 2018-05-22: qty 2

## 2018-05-22 MED ORDER — FAMOTIDINE 20 MG PO TABS
20.0000 mg | ORAL_TABLET | Freq: Every day | ORAL | Status: DC
  Filled 2018-05-22: qty 1

## 2018-05-22 MED ORDER — INSULIN ASPART 100 UNIT/ML ~~LOC~~ SOLN
0.0000 [IU] | SUBCUTANEOUS | Status: DC
  Administered 2018-05-22: 2 [IU] via SUBCUTANEOUS
  Administered 2018-05-23 (×2): 3 [IU] via SUBCUTANEOUS
  Administered 2018-05-24 – 2018-05-25 (×2): 2 [IU] via SUBCUTANEOUS
  Administered 2018-05-26: 3 [IU] via SUBCUTANEOUS
  Administered 2018-05-26: 2 [IU] via SUBCUTANEOUS
  Administered 2018-05-26 (×2): 3 [IU] via SUBCUTANEOUS
  Administered 2018-05-27: 2 [IU] via SUBCUTANEOUS
  Administered 2018-05-27 (×4): 3 [IU] via SUBCUTANEOUS
  Administered 2018-05-27: 5 [IU] via SUBCUTANEOUS
  Administered 2018-05-28: 8 [IU] via SUBCUTANEOUS
  Administered 2018-05-28 (×2): 3 [IU] via SUBCUTANEOUS
  Administered 2018-05-28: 5 [IU] via SUBCUTANEOUS
  Administered 2018-05-28: 3 [IU] via SUBCUTANEOUS
  Administered 2018-05-28 – 2018-05-29 (×2): 2 [IU] via SUBCUTANEOUS
  Administered 2018-05-29: 3 [IU] via SUBCUTANEOUS
  Administered 2018-05-29: 08:00:00 2 [IU] via SUBCUTANEOUS
  Administered 2018-05-30 – 2018-05-31 (×4): 3 [IU] via SUBCUTANEOUS
  Administered 2018-05-31: 05:00:00 2 [IU] via SUBCUTANEOUS
  Administered 2018-05-31: 12:00:00 3 [IU] via SUBCUTANEOUS
  Administered 2018-05-31 (×2): 5 [IU] via SUBCUTANEOUS
  Administered 2018-06-01: 3 [IU] via SUBCUTANEOUS
  Administered 2018-06-01: 2 [IU] via SUBCUTANEOUS
  Administered 2018-06-01: 5 [IU] via SUBCUTANEOUS
  Administered 2018-06-01: 08:00:00 3 [IU] via SUBCUTANEOUS
  Filled 2018-05-22 (×32): qty 1

## 2018-05-22 MED ORDER — MAGNESIUM OXIDE 400 (241.3 MG) MG PO TABS
400.0000 mg | ORAL_TABLET | Freq: Two times a day (BID) | ORAL | Status: DC
  Administered 2018-05-22 (×2): 400 mg
  Filled 2018-05-22 (×3): qty 1

## 2018-05-22 NOTE — Progress Notes (Signed)
SUBJ: Intubated, sedated.  Seems to acknowledge me but not following commands consistently.  Tolerating PS V 5-10 cm H2O.  No overt distress  OBJ: Vitals:   05/22/18 0800 05/22/18 0900 05/22/18 1000 05/22/18 1100  BP: 106/68 107/66 95/64 99/63   Pulse: (!) 107 (!) 101 97 91  Resp: 16 17 18 20   Temp:      TempSrc:      SpO2:      Weight:      Height:       Vent Mode: PRVC FiO2 (%):  [24 %] 24 % Set Rate:  [14 bmp] 14 bmp Vt Set:  [450 mL] 450 mL PEEP:  [2 cmH20-5 cmH20] 5 cmH20 Pressure Support:  [10 cmH20] 10 cmH20 Plateau Pressure:  [20 cmH20] 20 cmH20  Gen: Intubated, sedated, RASS 0, +/- F/C HEENT: NCAT, sclera white Neck: JVP not visualized Lungs: Clear anteriorly Cardiovascular: RRR, no murmurs Abdomen: Obese, soft, nontender, + BS Ext: Warm, no edema Neuro: CNs intact, moves all extremities Skin: Limited exam, no lesions noted  BMP Latest Ref Rng & Units 05/22/2018 05/21/2018 05/19/2018  Glucose 70 - 99 mg/dL 161(W156(H) 960(A116(H) 540(J181(H)  BUN 6 - 20 mg/dL 10 13 8   Creatinine 0.44 - 1.00 mg/dL 8.110.59 9.140.68 7.820.58  Sodium 135 - 145 mmol/L 135 135 136  Potassium 3.5 - 5.1 mmol/L 3.9 4.1 4.0  Chloride 98 - 111 mmol/L 99 102 105  CO2 22 - 32 mmol/L 30 31 27   Calcium 8.9 - 10.3 mg/dL 7.9(L) 8.1(L) 7.8(L)   CBC Latest Ref Rng & Units 05/20/2018 05/19/2018 05/18/2018  WBC 4.0 - 10.5 K/uL 13.2(H) 11.1(H) 12.3(H)  Hemoglobin 12.0 - 15.0 g/dL 11.7(L) 11.9(L) 12.9  Hematocrit 36.0 - 46.0 % 36.4 35.9(L) 39.7  Platelets 150 - 400 K/uL 127(L) 122(L) 107(L)   CXR: No new film    INDWELLING DEVICES:: R femoral CVL 12/15 >>  ETT 12/15 >>   MICRO DATA: MRSA PCR 12/16 >> negative Urine of/15 >> E. coli Resp 12/15 >> consistent with normal respiratory flora Blood 12/15 >> 1/2 Strep constellatus Blood 12/18 >> negative Blood 12/19 >> negative  ANTIMICROBIALS:  Anti-infectives (From admission, onward)   Start     Dose/Rate Route Frequency Ordered Stop   05/18/18 1800  cefTRIAXone  (ROCEPHIN) 2 g in sodium chloride 0.9 % 100 mL IVPB     2 g 200 mL/hr over 30 Minutes Intravenous Every 24 hours 05/18/18 0842 05/23/18 1759   05/16/18 1800  cefTRIAXone (ROCEPHIN) 2 g in sodium chloride 0.9 % 100 mL IVPB  Status:  Discontinued     2 g 200 mL/hr over 30 Minutes Intravenous Every 24 hours 05/16/18 1009 05/18/18 0842   05/14/18 1600  ceFEPIme (MAXIPIME) 2 g in sodium chloride 0.9 % 100 mL IVPB  Status:  Discontinued     2 g 200 mL/hr over 30 Minutes Intravenous Every 12 hours 05/14/18 0611 05/16/18 1009   05/14/18 1200  vancomycin (VANCOCIN) 1,250 mg in sodium chloride 0.9 % 250 mL IVPB  Status:  Discontinued     1,250 mg 166.7 mL/hr over 90 Minutes Intravenous Every 18 hours 05/14/18 0611 05/15/18 1337   05/14/18 0400  ceFEPIme (MAXIPIME) 2 g in sodium chloride 0.9 % 100 mL IVPB     2 g 200 mL/hr over 30 Minutes Intravenous  Once 05/14/18 0353 05/14/18 0449   05/14/18 0400  metroNIDAZOLE (FLAGYL) IVPB 500 mg  Status:  Discontinued     500 mg 100 mL/hr over 60 Minutes  Intravenous Every 8 hours 05/14/18 0353 05/15/18 1009   05/14/18 0400  vancomycin (VANCOCIN) IVPB 1000 mg/200 mL premix     1,000 mg 200 mL/hr over 60 Minutes Intravenous  Once 05/14/18 0353 05/14/18 0628     Vancomycin 12/15 >> 12/16 Metronidazole 12/15 >> 12/16 Cefepime 12/15 >> 12/17 Ceftriaxone 12/17 >>    IMPRESSION: Prolonged ventilator dependent respiratory failure Acute hypoxemic respiratory failure Suspected pneumonia, treated Streptococcal bacteremia Severe sepsis/septic shock, resolved AKI, resolved Hyperosmolar, hyperglycemic state, resolved Chronic debilitated state, SNF resident Guarded prognosis, palliative care following  PLAN/REC: Cont vent support - settings reviewed and/or adjusted Wean in PSV mode as tolerated today Cont vent bundle Daily SBT if/when meets criteria  Anticipate possible extubation 12/24  Need to clarify reintubation status prior to extubation Continue  hemodynamic monitoring Monitor BMET intermittently Monitor I/Os Correct electrolytes as indicated Monitor temp, WBC count Micro and abx as above Discontinue continuous sedation medicines RASS goal 0   CCM time: 35 mins The above time includes time spent in consultation with patient and/or family members and reviewing care plan on multidisciplinary rounds  Billy Fischeravid Syndey Jaskolski, MD PCCM service Mobile [phone] Pager [phone]

## 2018-05-22 NOTE — Progress Notes (Signed)
PharmacyMonitoring Consult:  Pharmacy consulted to assist in monitoring and replacing electrolytes, constipation management, and blood glucose management in this 62 y.o. female admitted on 05/14/2018 with Hypotension  Patient is currently requiring mechanical ventilation and is off all sedation. Tube feeds are currently infusing at 5610mL/hr. Patient is being transitioned to NG tube for probable extubation on 12/24.   Labs:  Sodium (mmol/L)  Date Value  05/22/2018 135  12/26/2013 138   Potassium (mmol/L)  Date Value  05/22/2018 3.9  12/26/2013 3.4 (L)   Magnesium (mg/dL)  Date Value  69/62/952812/23/2019 1.8   Phosphorus (mg/dL)  Date Value  41/32/440112/21/2019 3.6   Calcium (mg/dL)  Date Value  02/72/536612/23/2019 7.9 (L)   Calcium, Total (mg/dL)  Date Value  44/03/474207/29/2015 9.0   Albumin (g/dL)  Date Value  59/56/387512/16/2019 2.4 (L)  12/26/2013 3.6   Corrected calcium: 9.3  Assessment/Plan: 1. Electrolytes: Magnesium 400mg  VT BID.  Will replace for goal potassium ~ 4, goal magnesium ~ 2, and goal phosphorus > 2.5. BMP ordered for 12/24. Will continue to follow.   2. Constipation: Last documented bowel movement 1221. Tube feeds currently at 10 ml/hr. Will continue senna/docusate 2 tab VT BID. Miralax VT daily. If no bowel movement by 12/24, will consider bisacodyl suppository x 1.   3. Glucose: Continue ICU Hyperglycemia Phase 3. Levemir discontinued by attending this am.   Pharmacy will continue to monitor and adjust per consult.   Simpson,Michael L 05/22/2018 12:59 PM
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
## 2018-05-23 ENCOUNTER — Inpatient Hospital Stay: Payer: Medicare Other

## 2018-05-23 DIAGNOSIS — J9589 Other postprocedural complications and disorders of respiratory system, not elsewhere classified: Secondary | ICD-10-CM

## 2018-05-23 LAB — GLUCOSE, CAPILLARY
Glucose-Capillary: 119 mg/dL — ABNORMAL HIGH (ref 70–99)
Glucose-Capillary: 125 mg/dL — ABNORMAL HIGH (ref 70–99)
Glucose-Capillary: 158 mg/dL — ABNORMAL HIGH (ref 70–99)
Glucose-Capillary: 161 mg/dL — ABNORMAL HIGH (ref 70–99)
Glucose-Capillary: 73 mg/dL (ref 70–99)
Glucose-Capillary: 80 mg/dL (ref 70–99)

## 2018-05-23 LAB — CBC
HCT: 31.9 % — ABNORMAL LOW (ref 36.0–46.0)
Hemoglobin: 10.3 g/dL — ABNORMAL LOW (ref 12.0–15.0)
MCH: 31.8 pg (ref 26.0–34.0)
MCHC: 32.3 g/dL (ref 30.0–36.0)
MCV: 98.5 fL (ref 80.0–100.0)
Platelets: 245 10*3/uL (ref 150–400)
RBC: 3.24 MIL/uL — ABNORMAL LOW (ref 3.87–5.11)
RDW: 13.1 % (ref 11.5–15.5)
WBC: 9.4 10*3/uL (ref 4.0–10.5)
nRBC: 0 % (ref 0.0–0.2)

## 2018-05-23 LAB — BASIC METABOLIC PANEL
Anion gap: 7 (ref 5–15)
BUN: 9 mg/dL (ref 6–20)
CO2: 30 mmol/L (ref 22–32)
Calcium: 8.2 mg/dL — ABNORMAL LOW (ref 8.9–10.3)
Chloride: 100 mmol/L (ref 98–111)
Creatinine, Ser: 0.59 mg/dL (ref 0.44–1.00)
GFR calc non Af Amer: >60 mL/min (ref >60)
Glucose, Bld: 98 mg/dL (ref 70–99)
Potassium: 4 mmol/L (ref 3.5–5.1)
Sodium: 137 mmol/L (ref 135–145)

## 2018-05-23 LAB — CULTURE, BLOOD (ROUTINE X 2)
Culture: NO GROWTH
Culture: NO GROWTH

## 2018-05-23 MED ORDER — LACTATED RINGERS IV SOLN: INTRAVENOUS | Status: DC

## 2018-05-23 MED ORDER — GLYCOPYRROLATE 0.2 MG/ML IJ SOLN
0.2000 mg | Freq: Once | INTRAMUSCULAR | Status: AC
  Administered 2018-05-23: 0.2 mg via SUBCUTANEOUS
  Filled 2018-05-23: qty 1

## 2018-05-23 MED ORDER — METHYLPREDNISOLONE SODIUM SUCC 125 MG IJ SOLR
80.0000 mg | Freq: Once | INTRAMUSCULAR | Status: AC
  Administered 2018-05-23: 80 mg via INTRAVENOUS
  Filled 2018-05-23: qty 2

## 2018-05-23 MED ORDER — DEXTROSE IN LACTATED RINGERS 5 % IV SOLN
INTRAVENOUS | Status: DC
  Administered 2018-05-23: 10:00:00 via INTRAVENOUS

## 2018-05-23 MED ORDER — ORAL CARE MOUTH RINSE
15.0000 mL | Freq: Two times a day (BID) | OROMUCOSAL | Status: DC
  Administered 2018-05-24 – 2018-06-01 (×12): 15 mL via OROMUCOSAL

## 2018-05-23 NOTE — Care Management (Addendum)
Extubated today. Is experiencing post extubation stridor.  She is currently requiring Bipapp at 60%. Received one dose IV Solumedrol

## 2018-05-23 NOTE — Progress Notes (Addendum)
Cuff leak noted before extubation.  Patient extubated at 0900 and placed on [place] at 2lpm and had to be increased to 6lpm while noticing patient to have some upper airway stridor like sounds.  MD notified and came to room and patient was ordered to be placed on BIPAP.  Patient tolerating BIPAP well at this time. HR 110 RR 27  Oxygen saturations 96%

## 2018-05-23 NOTE — Progress Notes (Signed)
SUBJ: Passed SBT this morning.  No not following commands.  Appears awake and appears to regard examiner.  Extubated and developed stridor with very mild respiratory distress shortly after extubation.  Received a dose of methylprednisolone and had well supported on BiPAP currently.  OBJ: Vitals:   05/23/18 0319 05/23/18 0400 05/23/18 0500 05/23/18 0714  BP:   100/61   Pulse:      Resp:      Temp:  99 F (37.2 C)  98.6 F (37 C)  TempSrc:  Oral  Oral  SpO2:      Weight: 89.7 kg     Height:       Vent Mode: PSV FiO2 (%):  [24 %] 24 % PEEP:  [5 cmH20] 5 cmH20 Pressure Support:  [5 cmH20-15 cmH20] 10 cmH20 Plateau Pressure:  [10 cmH20-14 cmH20] 12 cmH20  Gen: RASS 0, not F/C, no distress on BiPAP HEENT: NCAT, sclera white Neck: Mild stridor Lungs: No adventitious sounds Cardiovascular: Regular, no M Abdomen: Obese, soft, NT, NABS Ext: Warm, no edema Neuro: No focal deficits noted Skin: Limited exam, no lesions noted  BMP Latest Ref Rng & Units 05/23/2018 05/22/2018 05/21/2018  Glucose 70 - 99 mg/dL 98 811(B156(H) 147(W116(H)  BUN 6 - 20 mg/dL 9 10 13   Creatinine 0.44 - 1.00 mg/dL 2.950.59 6.210.59 3.080.68  Sodium 135 - 145 mmol/L 137 135 135  Potassium 3.5 - 5.1 mmol/L 4.0 3.9 4.1  Chloride 98 - 111 mmol/L 100 99 102  CO2 22 - 32 mmol/L 30 30 31   Calcium 8.9 - 10.3 mg/dL 8.2(L) 7.9(L) 8.1(L)   CBC Latest Ref Rng & Units 05/23/2018 05/20/2018 05/19/2018  WBC 4.0 - 10.5 K/uL 9.4 13.2(H) 11.1(H)  Hemoglobin 12.0 - 15.0 g/dL 10.3(L) 11.7(L) 11.9(L)  Hematocrit 36.0 - 46.0 % 31.9(L) 36.4 35.9(L)  Platelets 150 - 400 K/uL 245 127(L) 122(L)   CXR: Bibasilar atelectasis    INDWELLING DEVICES:: R femoral CVL 12/15 >>  ETT 12/15 >> 12/24  MICRO DATA: MRSA PCR 12/16 >> negative Urine of/15 >> E. coli Resp 12/15 >> consistent with normal respiratory flora Blood 12/15 >> 1/2 Strep constellatus Blood 12/18 >> negative Blood 12/19 >> negative  ANTIMICROBIALS:  Anti-infectives (From  admission, onward)   Start     Dose/Rate Route Frequency Ordered Stop   05/18/18 1800  cefTRIAXone (ROCEPHIN) 2 g in sodium chloride 0.9 % 100 mL IVPB     2 g 200 mL/hr over 30 Minutes Intravenous Every 24 hours 05/18/18 0842 05/22/18 1724   05/16/18 1800  cefTRIAXone (ROCEPHIN) 2 g in sodium chloride 0.9 % 100 mL IVPB  Status:  Discontinued     2 g 200 mL/hr over 30 Minutes Intravenous Every 24 hours 05/16/18 1009 05/18/18 0842   05/14/18 1600  ceFEPIme (MAXIPIME) 2 g in sodium chloride 0.9 % 100 mL IVPB  Status:  Discontinued     2 g 200 mL/hr over 30 Minutes Intravenous Every 12 hours 05/14/18 0611 05/16/18 1009   05/14/18 1200  vancomycin (VANCOCIN) 1,250 mg in sodium chloride 0.9 % 250 mL IVPB  Status:  Discontinued     1,250 mg 166.7 mL/hr over 90 Minutes Intravenous Every 18 hours 05/14/18 0611 05/15/18 1337   05/14/18 0400  ceFEPIme (MAXIPIME) 2 g in sodium chloride 0.9 % 100 mL IVPB     2 g 200 mL/hr over 30 Minutes Intravenous  Once 05/14/18 0353 05/14/18 0449   05/14/18 0400  metroNIDAZOLE (FLAGYL) IVPB 500 mg  Status:  Discontinued  500 mg 100 mL/hr over 60 Minutes Intravenous Every 8 hours 05/14/18 0353 05/15/18 1009   05/14/18 0400  vancomycin (VANCOCIN) IVPB 1000 mg/200 mL premix     1,000 mg 200 mL/hr over 60 Minutes Intravenous  Once 05/14/18 0353 05/14/18 0628     Vancomycin 12/15 >> 12/16 Metronidazole 12/15 >> 12/16 Cefepime 12/15 >> 12/17 Ceftriaxone 12/17 >> 12/24   IMPRESSION: Acute hypoxemic respiratory failure, resolving Prolonged ventilator dependent respiratory failure Post extubation stridor Suspected pneumonia, treated Streptococcal bacteremia, treated Severe sepsis/septic shock, resolved AKI, resolved Hyperosmolar, hyperglycemic state, resolved Chronic debilitated state, SNF resident   PLAN/REC: Continue BiPAP as needed Continue supplemental oxygen as needed Continue hemodynamic monitoring Monitor BMET intermittently Monitor  I/Os Correct electrolytes as indicated Monitor temp, WBC count Micro and abx as above Minimize all sedating medications  Goals of care need to be further clarified with family.  She is a very poor candidate for repeated intubations given her poor prognosis for functional recovery   CCM time: 30 mins  The above time includes time spent in consultation with patient and/or family members and reviewing care plan on multidisciplinary rounds  Billy Fischeravid Simonds, MD PCCM service Mobile [phone] Pager [phone] 05/23/2018 11:17 AM
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
## 2018-05-23 NOTE — Progress Notes (Signed)
Pt extubated and coughing up a large amount of secretions with stridor. MD at bedside ordered bipap, solumedrol, and robinul. Orders carried out, will continue to monitor pt closely.
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
## 2018-05-24 ENCOUNTER — Other Ambulatory Visit: Payer: Self-pay

## 2018-05-24 DIAGNOSIS — Z7189 Other specified counseling: Secondary | ICD-10-CM

## 2018-05-24 DIAGNOSIS — Z515 Encounter for palliative care: Secondary | ICD-10-CM

## 2018-05-24 DIAGNOSIS — J9621 Acute and chronic respiratory failure with hypoxia: Secondary | ICD-10-CM

## 2018-05-24 DIAGNOSIS — E1101 Type 2 diabetes mellitus with hyperosmolarity with coma: Secondary | ICD-10-CM

## 2018-05-24 DIAGNOSIS — E860 Dehydration: Secondary | ICD-10-CM

## 2018-05-24 DIAGNOSIS — R6521 Severe sepsis with septic shock: Secondary | ICD-10-CM

## 2018-05-24 DIAGNOSIS — J9622 Acute and chronic respiratory failure with hypercapnia: Secondary | ICD-10-CM

## 2018-05-24 LAB — GLUCOSE, CAPILLARY
Glucose-Capillary: 113 mg/dL — ABNORMAL HIGH (ref 70–99)
Glucose-Capillary: 90 mg/dL (ref 70–99)
Glucose-Capillary: 81 mg/dL (ref 70–99)
Glucose-Capillary: 117 mg/dL — ABNORMAL HIGH (ref 70–99)
Glucose-Capillary: 116 mg/dL — ABNORMAL HIGH (ref 70–99)
Glucose-Capillary: 103 mg/dL — ABNORMAL HIGH (ref 70–99)

## 2018-05-24 MED ORDER — DEXAMETHASONE SODIUM PHOSPHATE 4 MG/ML IJ SOLN: 8.0000 mg | Freq: Once | INTRAMUSCULAR | Status: DC

## 2018-05-24 MED ORDER — SODIUM CHLORIDE 0.9 % IV BOLUS
500.0000 mL | Freq: Once | INTRAVENOUS | Status: AC
  Administered 2018-05-24: 500 mL via INTRAVENOUS

## 2018-05-24 MED ORDER — LACTATED RINGERS IV BOLUS
500.0000 mL | Freq: Once | INTRAVENOUS | Status: AC
  Administered 2018-05-24: 500 mL via INTRAVENOUS

## 2018-05-24 NOTE — Progress Notes (Signed)
SUBJ: Spent most of yesterday on BiPAP.  This morning, stridor resolved.  However, did not tolerate very long off of BiPAP.  LOC normal.  Intermittently follows commands.  OBJ: Vitals:   05/24/18 0700 05/24/18 0800 05/24/18 0900 05/24/18 1000  BP: (!) 84/56 (!) 95/57 90/60 105/64  Pulse: 85 91 94 99  Resp: (!) 26 (!) 26 (!) 21 (!) 26  Temp:  98.2 F (36.8 C)    TempSrc:  Axillary    SpO2: 99% 99% 99% 93%  Weight:      Height:       FiO2 (%):  [60 %] 60 %  Gen: RASS 0, +/- F/C, presently supported on BiPAP HEENT: NCAT, sclera white Neck: Thick neck, JVP not visualized, no stridor Lungs: Scattered rhonchi, no wheezes Cardiovascular: Regular, no M Abdomen: Obese, soft, NT, NABS Ext: Warm, no edema Neuro: CNs intact, moves all extremities Skin: Limited exam, no lesions noted  BMP Latest Ref Rng & Units 05/23/2018 05/22/2018 05/21/2018  Glucose 70 - 99 mg/dL 98 161(W156(H) 960(A116(H)  BUN 6 - 20 mg/dL 9 10 13   Creatinine 0.44 - 1.00 mg/dL 5.400.59 9.810.59 1.910.68  Sodium 135 - 145 mmol/L 137 135 135  Potassium 3.5 - 5.1 mmol/L 4.0 3.9 4.1  Chloride 98 - 111 mmol/L 100 99 102  CO2 22 - 32 mmol/L 30 30 31   Calcium 8.9 - 10.3 mg/dL 8.2(L) 7.9(L) 8.1(L)   CBC Latest Ref Rng & Units 05/23/2018 05/20/2018 05/19/2018  WBC 4.0 - 10.5 K/uL 9.4 13.2(H) 11.1(H)  Hemoglobin 12.0 - 15.0 g/dL 10.3(L) 11.7(L) 11.9(L)  Hematocrit 36.0 - 46.0 % 31.9(L) 36.4 35.9(L)  Platelets 150 - 400 K/uL 245 127(L) 122(L)   CXR: No new film    INDWELLING DEVICES:: R femoral CVL 12/15 >>  ETT 12/15 >> 12/24  MICRO DATA: MRSA PCR 12/16 >> negative Urine of/15 >> E. coli Resp 12/15 >> consistent with normal respiratory flora Blood 12/15 >> 1/2 Strep constellatus Blood 12/18 >> negative Blood 12/19 >> negative  ANTIMICROBIALS:  Anti-infectives (From admission, onward)   Start     Dose/Rate Route Frequency Ordered Stop   05/18/18 1800  cefTRIAXone (ROCEPHIN) 2 g in sodium chloride 0.9 % 100 mL IVPB     2 g 200  mL/hr over 30 Minutes Intravenous Every 24 hours 05/18/18 0842 05/22/18 1724   05/16/18 1800  cefTRIAXone (ROCEPHIN) 2 g in sodium chloride 0.9 % 100 mL IVPB  Status:  Discontinued     2 g 200 mL/hr over 30 Minutes Intravenous Every 24 hours 05/16/18 1009 05/18/18 0842   05/14/18 1600  ceFEPIme (MAXIPIME) 2 g in sodium chloride 0.9 % 100 mL IVPB  Status:  Discontinued     2 g 200 mL/hr over 30 Minutes Intravenous Every 12 hours 05/14/18 0611 05/16/18 1009   05/14/18 1200  vancomycin (VANCOCIN) 1,250 mg in sodium chloride 0.9 % 250 mL IVPB  Status:  Discontinued     1,250 mg 166.7 mL/hr over 90 Minutes Intravenous Every 18 hours 05/14/18 0611 05/15/18 1337   05/14/18 0400  ceFEPIme (MAXIPIME) 2 g in sodium chloride 0.9 % 100 mL IVPB     2 g 200 mL/hr over 30 Minutes Intravenous  Once 05/14/18 0353 05/14/18 0449   05/14/18 0400  metroNIDAZOLE (FLAGYL) IVPB 500 mg  Status:  Discontinued     500 mg 100 mL/hr over 60 Minutes Intravenous Every 8 hours 05/14/18 0353 05/15/18 1009   05/14/18 0400  vancomycin (VANCOCIN) IVPB 1000 mg/200 mL  premix     1,000 mg 200 mL/hr over 60 Minutes Intravenous  Once 05/14/18 0353 05/14/18 0628     Vancomycin 12/15 >> 12/16 Metronidazole 12/15 >> 12/16 Cefepime 12/15 >> 12/17 Ceftriaxone 12/17 >> 12/24   IMPRESSION: Acute/chronic hypoxemic respiratory failure, resolving Prolonged ventilator dependent respiratory failure BiPAP dependence Post extubation stridor, appears to be resolved Suspected pneumonia, treated Streptococcal bacteremia, treated Severe sepsis/septic shock, resolved AKI, resolved Hyperosmolar, hyperglycemic state, resolved Chronic debilitated state, SNF resident   PLAN/REC: Continue BiPAP as needed Continue supplemental oxygen as needed Continue hemodynamic monitoring Monitor BMET intermittently Monitor I/Os Correct electrolytes as indicated Monitor temp, WBC count Micro and abx as above Minimize all sedating medications  I  am scheduled to meet with patient's sister and 2 nieces later this afternoon.  We will discuss issue of reintubation and implications of such if we were to undertake this   CCM time:   The above time includes time spent in consultation with patient and/or family members and reviewing care plan on multidisciplinary rounds  Billy Fischeravid Dauna Ziska, MD PCCM service Mobile [phone] Pager [phone] 05/24/2018 12:10 PM

## 2018-05-24 NOTE — Progress Notes (Signed)
Patient placed on 4lpm [place]. Tolerating well.

## 2018-05-24 NOTE — Progress Notes (Signed)
[employer] [facility] - [place] at [facility]   PATIENT NAME: Kaitlyn Ruiz    MR#:  [id number]  DATE OF BIRTH:  [birth year]  SUBJECTIVE:   She remains on BiPAP.  Not following commands.  REVIEW OF SYSTEMS:    unable to obtain   Tolerating Diet:NPO      DRUG ALLERGIES:  No Known Allergies  VITALS:  Blood pressure 105/64, pulse 99, temperature 98.2 F (36.8 C), temperature source Axillary, resp. rate (!) 26, height 5\' 7"  (1.702 m), weight 89.7 kg, SpO2 93 %.  PHYSICAL EXAMINATION:  Constitutional: Appears well-developed and well-nourished. HEENT: Normocephalic. . On BiPAP  eyes: Conjunctivae  normal. no scleral icterus.  Neck: Normal ROM. Neck supple. No JVD. No tracheal deviation. CVS: RRR, S1/S2 +, no murmurs, no gallops, no carotid bruit.  Pulmonary: Effort and breath sounds normal, no rhonchi, wheezes, rales.  Abdominal: Soft. BS +,  no distension, tenderness, rebound or guarding.  Musculoskeletal: . No edema and no tenderness.  Neuro: No obvious focal deficits Skin: Skin is warm and dry. No rash noted.      LABORATORY PANEL:   CBC Recent Labs  Lab 05/23/18 0535  WBC 9.4  HGB 10.3*  HCT 31.9*  PLT 245   ------------------------------------------------------------------------------------------------------------------  Chemistries  Recent Labs  Lab 05/22/18 0557 05/23/18 0535  NA 135 137  K 3.9 4.0  CL 99 100  CO2 30 30  GLUCOSE 156* 98  BUN 10 9  CREATININE 0.59 0.59  CALCIUM 7.9* 8.2*  MG 1.8  --    ------------------------------------------------------------------------------------------------------------------  Cardiac Enzymes No results for input(s): TROPONINI in the last 168 hours. ------------------------------------------------------------------------------------------------------------------  RADIOLOGY:  Dg Chest Port 1 View  Result Date: 05/23/2018 CLINICAL DATA:  Respiratory failure. EXAM: PORTABLE CHEST 1 VIEW  COMPARISON:  05/20/2018. FINDINGS: Stable cardiomediastinal silhouette. Unchanged retrocardiac density, representing atelectasis, possible infiltrate. Low lung volumes. Bibasilar scarring. Stable support tubes and lines. ET tube remains in good position 3.8 cm above. IMPRESSION: Stable chest. Electronically Signed   By: Elsie StainJohn T Curnes M.D.   On: 05/23/2018 07:00     ASSESSMENT AND PLAN:   62 year old female with a history of diabetes who presents to the emergency room due to labored breathing.  1.  Acute hypoxic respiratory failure with septic shock due to pneumonia Patient extubated 12/24 and remains on BiPAP Continue management as per intensivist Treated for pneumonia  2.  UTI with E. coli sensitive to ceftriaxone Patient has been treated for UTI and antibiotics have been discontinued.    3.  Hyponatremia from dehydration: This is resolved  4.  Hypokalemia: Resolved  5.  Uncontrolled diabetes: Sliding scale   Palliative care consulted.   Patient's overall prognosis still guarded and poor  Family may be leaning towards DNR   CODE STATUS: FULL  TOTAL TIME TAKING CARE OF THIS PATIENT: 22 minutes.     POSSIBLE D/C ??, DEPENDING ON CLINICAL CONDITION.   Ryenn Howeth M.D on 05/24/2018 at 10:52 AM  Between 7am to 6pm - Pager - [phone] After 6pm go to [email or web address] - password [email or web address]  [facility]  Office  [phone]  CC: Primary care physician; Patient, No Pcp Per  Note: This dictation was prepared with Dragon dictation along with smaller phrase technology. Any transcriptional errors that result from this process are unintentional.

## 2018-05-24 NOTE — Progress Notes (Signed)
Turned patient and RR started increasing to the 30s, 40s and even 50s on nasal cannula. Tried to allow the patient to rest to help decrease her RR. No success. O2 sats decreased to 87. Instructed patient to slowly breath through the nose. The patient briefly did this and the O2 sats would increase to 90. This lasted for a few minutes before her O2 sats decreased back to 87. Placed patient back on bipap. RR still in 30s, 40s, 50s. Consulting civil engineerCharge RN notified. Charge RN notified ICU MD. ICU MD came to bedside. ICU MD gave no new orders and instructed the family to call the POA for a family meeting about goals of care. Will continue to monitor.

## 2018-05-24 NOTE — Progress Notes (Signed)
Patient's BP 80s/40s with a MAP in the 50s. Rechecked  BP in different arm and got 70s/40s with MAP in 50s. Rechecked BP again and got similar result. ICU MD notified. Received new orders to give normal saline bolus. Will continue to monitor.

## 2018-05-25 ENCOUNTER — Inpatient Hospital Stay: Payer: Medicare Other

## 2018-05-25 DIAGNOSIS — G9349 Other encephalopathy: Secondary | ICD-10-CM

## 2018-05-25 DIAGNOSIS — E1165 Type 2 diabetes mellitus with hyperglycemia: Secondary | ICD-10-CM

## 2018-05-25 LAB — BASIC METABOLIC PANEL
Anion gap: 3 — ABNORMAL LOW (ref 5–15)
BUN: 10 mg/dL (ref 6–20)
CHLORIDE: 105 mmol/L (ref 98–111)
CO2: 30 mmol/L (ref 22–32)
Calcium: 8.1 mg/dL — ABNORMAL LOW (ref 8.9–10.3)
Creatinine, Ser: 0.61 mg/dL (ref 0.44–1.00)
GFR calc Af Amer: >60 mL/min (ref >60)
GFR calc non Af Amer: >60 mL/min (ref >60)
Glucose, Bld: 133 mg/dL — ABNORMAL HIGH (ref 70–99)
Potassium: 3.5 mmol/L (ref 3.5–5.1)
Sodium: 138 mmol/L (ref 135–145)

## 2018-05-25 LAB — CBC
HCT: 29.8 % — ABNORMAL LOW (ref 36.0–46.0)
Hemoglobin: 9.6 g/dL — ABNORMAL LOW (ref 12.0–15.0)
MCH: 31.6 pg (ref 26.0–34.0)
MCHC: 32.2 g/dL (ref 30.0–36.0)
MCV: 98 fL (ref 80.0–100.0)
Platelets: 322 10*3/uL (ref 150–400)
RBC: 3.04 MIL/uL — ABNORMAL LOW (ref 3.87–5.11)
RDW: 13.2 % (ref 11.5–15.5)
WBC: 10.9 10*3/uL — ABNORMAL HIGH (ref 4.0–10.5)
nRBC: 0 % (ref 0.0–0.2)

## 2018-05-25 LAB — GLUCOSE, CAPILLARY
Glucose-Capillary: 91 mg/dL (ref 70–99)
Glucose-Capillary: 80 mg/dL (ref 70–99)
Glucose-Capillary: 111 mg/dL — ABNORMAL HIGH (ref 70–99)
Glucose-Capillary: 115 mg/dL — ABNORMAL HIGH (ref 70–99)
Glucose-Capillary: 138 mg/dL — ABNORMAL HIGH (ref 70–99)
Glucose-Capillary: 102 mg/dL — ABNORMAL HIGH (ref 70–99)

## 2018-05-25 MED ORDER — PRO-STAT SUGAR FREE PO LIQD
30.0000 mL | Freq: Two times a day (BID) | ORAL | Status: DC
  Administered 2018-05-25 – 2018-06-01 (×14): 30 mL

## 2018-05-25 MED ORDER — OSMOLITE 1.5 CAL PO LIQD
1000.0000 mL | ORAL | Status: DC
  Administered 2018-05-25 – 2018-05-28 (×4): 1000 mL

## 2018-05-25 NOTE — Progress Notes (Signed)
Palliative Note:  Left message with niece to follow-up with questions and guidance with care. Will await call back to discuss further and re-evaluate goals of care. Left personal number for her to return call given, writer will not be working over the next 3 days and will return on Monday. Ex  Will attempt to contact family again if no return call by 4pm.
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
## 2018-05-25 NOTE — Progress Notes (Signed)
[facility] - [place] at [facility]   PATIENT NAME: Marguerite OleaSharon Lucks    MR#:  [id number]  DATE OF BIRTH:  01-13-1957  SUBJECTIVE:   Weaned off of BiPAP on nasal cannula still not following commands REVIEW OF SYSTEMS:    unable to obtain         DRUG ALLERGIES:  No Known Allergies  VITALS:  Blood pressure 104/61, pulse 75, temperature 98.4 F (36.9 C), temperature source Axillary, resp. rate (!) 23, height 5\' 7"  (1.702 m), weight 89.7 kg, SpO2 98 %.  PHYSICAL EXAMINATION:  Constitutional: Appears well-developed and well-nourished. HEENT: Normocephalic. Marland Kitchen. eyes: Conjunctivae  normal. no scleral icterus.  Neck: Normal ROM. Neck supple. No JVD. No tracheal deviation. CVS: RRR, S1/S2 +, no murmurs, no gallops, no carotid bruit.  Pulmonary: Effort and breath sounds normal, no rhonchi, wheezes, rales.  Abdominal: Soft. BS +,  no distension, tenderness, rebound or guarding.  Musculoskeletal: . No edema and no tenderness.  Neuro: No obvious focal deficits does not follow commands Skin: Skin is warm and dry. No rash noted.      LABORATORY PANEL:   CBC Recent Labs  Lab 05/25/18 0931  WBC 10.9*  HGB 9.6*  HCT 29.8*  PLT 322   ------------------------------------------------------------------------------------------------------------------  Chemistries  Recent Labs  Lab 05/22/18 0557  05/25/18 0931  NA 135   < > 138  K 3.9   < > 3.5  CL 99   < > 105  CO2 30   < > 30  GLUCOSE 156*   < > 133*  BUN 10   < > 10  CREATININE 0.59   < > 0.61  CALCIUM 7.9*   < > 8.1*  MG 1.8  --   --    < > = values in this interval not displayed.   ------------------------------------------------------------------------------------------------------------------  Cardiac Enzymes No results for input(s): TROPONINI in the last 168 hours. ------------------------------------------------------------------------------------------------------------------  RADIOLOGY:  No  results found.   ASSESSMENT AND PLAN:   62 year old female with a history of diabetes who presents to the emergency room due to labored breathing.  1.  Acute hypoxic respiratory failure with septic shock due to pneumonia Patient extubated 12/24 and weaned from BiPAP to nasal cannula Continue management as per intensivist Treated for pneumonia  2.  UTI with E. coli sensitive to ceftriaxone Patient has been treated for UTI and antibiotics have been discontinued.    3.  Hyponatremia from dehydration: This is resolved  4.  Hypokalemia: Resolved  5.   diabetes: Sliding scale   Palliative care consulted.   Patient's overall prognosis still guarded and poor  Family may be leaning towards DNR   CODE STATUS: FULL  TOTAL TIME TAKING CARE OF THIS PATIENT: 22 minutes.     POSSIBLE D/C ??, DEPENDING ON CLINICAL CONDITION.   Onedia Vargus M.D on 05/25/2018 at 11:54 AM  Between 7am to 6pm - Pager - [phone] After 6pm go to [email or web address] - password [email or web address]  [facility] [place]  Office  [phone]  CC: Primary care physician; Patient, No Pcp Per  Note: This dictation was prepared with Dragon dictation along with smaller phrase technology. Any transcriptional errors that result from this process are unintentional.

## 2018-05-25 NOTE — Progress Notes (Signed)
Patient has been having low urinary output. Bladder scanned the patient to check if the foley was draining properly. Bladder scan showed 0mL in the bladder. ICU MD aware.

## 2018-05-25 NOTE — Progress Notes (Addendum)
SUBJ: Off BiPAP all night and presently comfortable on  O2.  Stridor has resolved.  Appears more alert and able to follow some commands.  Nonverbal.  RN notes oliguria.  She had transient hypotension last night which responded to normal saline bolus  OBJ: Vitals:   05/25/18 0826 05/25/18 0900 05/25/18 1000 05/25/18 1100  BP:   104/61   Pulse:  90 88 75  Resp:  (!) 31 (!) 31 (!) 23  Temp:      TempSrc:      SpO2: 93% 100% 100% 98%  Weight:      Height:       FiO2 (%):  [36 %] 36 %  Gen: RASS 0, +/- F/C HEENT: NCAT, sclera white Neck: Thick neck, JVP not visualized, no stridor Lungs: Clear anteriorly without wheezes Cardiovascular: RRR, no M Abdomen: Soft, NT, NABS Ext: Warm, no edema Neuro: CNs intact, moves all extremities Skin: Limited exam, no lesions noted  BMP Latest Ref Rng & Units 05/25/2018 05/23/2018 05/22/2018  Glucose 70 - 99 mg/dL 161(W133(H) 98 960(A156(H)  BUN 6 - 20 mg/dL 10 9 10   Creatinine 0.44 - 1.00 mg/dL 5.400.61 9.810.59 1.910.59  Sodium 135 - 145 mmol/L 138 137 135  Potassium 3.5 - 5.1 mmol/L 3.5 4.0 3.9  Chloride 98 - 111 mmol/L 105 100 99  CO2 22 - 32 mmol/L 30 30 30   Calcium 8.9 - 10.3 mg/dL 8.1(L) 8.2(L) 7.9(L)   CBC Latest Ref Rng & Units 05/25/2018 05/23/2018 05/20/2018  WBC 4.0 - 10.5 K/uL 10.9(H) 9.4 13.2(H)  Hemoglobin 12.0 - 15.0 g/dL 4.7(W9.6(L) 10.3(L) 11.7(L)  Hematocrit 36.0 - 46.0 % 29.8(L) 31.9(L) 36.4  Platelets 150 - 400 K/uL 322 245 127(L)   CXR: No new film    INDWELLING DEVICES:: R femoral CVL 12/15 >> 12/26 (removal ordered) ETT 12/15 >> 12/24  MICRO DATA: MRSA PCR 12/16 >> negative Urine of/15 >> E. coli Resp 12/15 >> consistent with normal respiratory flora Blood 12/15 >> 1/2 Strep constellatus Blood 12/18 >> negative Blood 12/19 >> negative  ANTIMICROBIALS:  Anti-infectives (From admission, onward)   Start     Dose/Rate Route Frequency Ordered Stop   05/18/18 1800  cefTRIAXone (ROCEPHIN) 2 g in sodium chloride 0.9 % 100 mL IVPB      2 g 200 mL/hr over 30 Minutes Intravenous Every 24 hours 05/18/18 0842 05/22/18 1724   05/16/18 1800  cefTRIAXone (ROCEPHIN) 2 g in sodium chloride 0.9 % 100 mL IVPB  Status:  Discontinued     2 g 200 mL/hr over 30 Minutes Intravenous Every 24 hours 05/16/18 1009 05/18/18 0842   05/14/18 1600  ceFEPIme (MAXIPIME) 2 g in sodium chloride 0.9 % 100 mL IVPB  Status:  Discontinued     2 g 200 mL/hr over 30 Minutes Intravenous Every 12 hours 05/14/18 0611 05/16/18 1009   05/14/18 1200  vancomycin (VANCOCIN) 1,250 mg in sodium chloride 0.9 % 250 mL IVPB  Status:  Discontinued     1,250 mg 166.7 mL/hr over 90 Minutes Intravenous Every 18 hours 05/14/18 0611 05/15/18 1337   05/14/18 0400  ceFEPIme (MAXIPIME) 2 g in sodium chloride 0.9 % 100 mL IVPB     2 g 200 mL/hr over 30 Minutes Intravenous  Once 05/14/18 0353 05/14/18 0449   05/14/18 0400  metroNIDAZOLE (FLAGYL) IVPB 500 mg  Status:  Discontinued     500 mg 100 mL/hr over 60 Minutes Intravenous Every 8 hours 05/14/18 0353 05/15/18 1009   05/14/18 0400  vancomycin (VANCOCIN) IVPB 1000 mg/200 mL premix     1,000 mg 200 mL/hr over 60 Minutes Intravenous  Once 05/14/18 0353 05/14/18 0628     Vancomycin 12/15 >> 12/16 Metronidazole 12/15 >> 12/16 Cefepime 12/15 >> 12/17 Ceftriaxone 12/17 >> 12/24   IMPRESSION: Acute/chronic hypoxemic respiratory failure, resolving Prolonged ventilator dependent respiratory failure, resolved Intermittent BiPAP dependence, improving Post extubation stridor, resolved Suspected pneumonia, treated Streptococcal bacteremia, treated Severe sepsis/septic shock, resolved Hyperosmolar, hyperglycemic state, resolved Type 2 diabetes, controlled Chronic debilitated state, SNF resident Oliguria - Uo is low but creatinine appears to be relatively stable  PLAN/REC: Continue to watch and SDU at least through today Continue BiPAP as needed Continue supplemental oxygen as needed Continue hemodynamic  monitoring Monitor BMET intermittently Monitor I/Os Correct electrolytes as indicated Bladder scan and Foley irrigation ordered 12/26 Continue sliding-scale insulin Monitor temp, WBC count Micro and abx as above Minimize all sedating medications SLP eval 12/26.  Might require NGT placement if unable to take nutrition orally Femoral CVL removal ordered 12/26  I spoke with patient's sister and 2 nieces yesterday.  They still desire full aggressive care to include reintubation if necessary.  Palliative care is involved.   CCM time:   The above time includes time spent in consultation with patient and/or family members and reviewing care plan on multidisciplinary rounds and with palliative care team  Billy Fischeravid Pruitt Taboada, MD PCCM service Mobile [phone] Pager [phone] 05/25/2018 11:53 AM

## 2018-05-26 ENCOUNTER — Inpatient Hospital Stay: Payer: Medicare Other

## 2018-05-26 LAB — COMPREHENSIVE METABOLIC PANEL
ALT: 25 U/L (ref 0–44)
AST: 70 U/L — ABNORMAL HIGH (ref 15–41)
Albumin: 1.8 g/dL — ABNORMAL LOW (ref 3.5–5.0)
Alkaline Phosphatase: 63 U/L (ref 38–126)
Anion gap: 6 (ref 5–15)
BUN: 13 mg/dL (ref 6–20)
CO2: 26 mmol/L (ref 22–32)
Calcium: 8 mg/dL — ABNORMAL LOW (ref 8.9–10.3)
Chloride: 106 mmol/L (ref 98–111)
Creatinine, Ser: 0.55 mg/dL (ref 0.44–1.00)
GFR calc Af Amer: >60 mL/min (ref >60)
GFR calc non Af Amer: >60 mL/min (ref >60)
Glucose, Bld: 171 mg/dL — ABNORMAL HIGH (ref 70–99)
POTASSIUM: 3.6 mmol/L (ref 3.5–5.1)
Sodium: 138 mmol/L (ref 135–145)
Total Bilirubin: 0.8 mg/dL (ref 0.3–1.2)
Total Protein: 5.9 g/dL — ABNORMAL LOW (ref 6.5–8.1)

## 2018-05-26 LAB — CBC
HCT: 30.7 % — ABNORMAL LOW (ref 36.0–46.0)
Hemoglobin: 9.8 g/dL — ABNORMAL LOW (ref 12.0–15.0)
MCH: 31 pg (ref 26.0–34.0)
MCHC: 31.9 g/dL (ref 30.0–36.0)
MCV: 97.2 fL (ref 80.0–100.0)
Platelets: 154 10*3/uL (ref 150–400)
RBC: 3.16 MIL/uL — ABNORMAL LOW (ref 3.87–5.11)
RDW: 12.9 % (ref 11.5–15.5)
WBC: 7.3 10*3/uL (ref 4.0–10.5)
nRBC: 0.3 % — ABNORMAL HIGH (ref 0.0–0.2)

## 2018-05-26 LAB — GLUCOSE, CAPILLARY
Glucose-Capillary: 117 mg/dL — ABNORMAL HIGH (ref 70–99)
Glucose-Capillary: 194 mg/dL — ABNORMAL HIGH (ref 70–99)
Glucose-Capillary: 129 mg/dL — ABNORMAL HIGH (ref 70–99)
Glucose-Capillary: 166 mg/dL — ABNORMAL HIGH (ref 70–99)
Glucose-Capillary: 187 mg/dL — ABNORMAL HIGH (ref 70–99)

## 2018-05-26 MED ORDER — FREE WATER
150.0000 mL | Status: DC
  Administered 2018-05-26 – 2018-05-30 (×18): 150 mL

## 2018-05-26 MED ORDER — POLYETHYLENE GLYCOL 3350 17 G PO PACK: 17.0000 g | PACK | Freq: Every day | ORAL | Status: DC | PRN

## 2018-05-26 NOTE — Progress Notes (Signed)
Pt is being transferred to the 1C medical-surgical floor to room 133. Report called and given to Charlotta NewtonAshley Romerez, RN and she verbalized understanding of the report given. The patient is stable, VSS, she does not c/o pain or show any signs of distress. The patient was cleaned, changed and peri care was performed prior to transfer. A new purwick, brief and pink foam was place on the patient. The family has been updated.

## 2018-05-26 NOTE — Treatment Plan (Signed)
Patient was discussed during multidisciplinary rounds in the ICU.  She has been stable and will be transferred to the floor.  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  Recommend to continue tube feeds via NG however, if she fails swallow eval on Monday or Tuesday recommend consideration for PEG tube as appears to be what the patient's family wants.  Palliative care is involved and recommend ongoing discussion with regards to goals of care.  Pulmonary/CCM will sign off please reconsult as needed.

## 2018-05-26 NOTE — Progress Notes (Signed)
[facility] - [place] at [facility]   PATIENT NAME: Kaitlyn OleaSharon Herman    MR#:  [id number]  DATE OF BIRTH:  [birth year]  SUBJECTIVE:    Patient failed swallow evaluation. She has minimal responsiveness and follows very little commands REVIEW OF SYSTEMS:    unable to obtain         DRUG ALLERGIES:  No Known Allergies  VITALS:  Blood pressure 118/74, pulse 80, temperature 99.9 F (37.7 C), temperature source Axillary, resp. rate (!) 22, height 5\' 7"  (1.702 m), weight 89.7 kg, SpO2 (!) 81 %.  PHYSICAL EXAMINATION:  Constitutional: Appears well-developed and well-nourished. HEENT: Normocephalic. Marland Kitchen. eyes: Conjunctivae  normal. no scleral icterus.  Neck: Normal ROM. Neck supple. No JVD. No tracheal deviation. CVS: RRR, S1/S2 +, no murmurs, no gallops, no carotid bruit.  Pulmonary: Effort and breath sounds normal, no rhonchi, wheezes, rales.  Abdominal: Soft. BS +,  no distension, tenderness, rebound or guarding.  Musculoskeletal: . No edema and no tenderness.  Neuro: No obvious focal deficits follows very little commands   skin: Skin is warm and dry. No rash noted.      LABORATORY PANEL:   CBC Recent Labs  Lab 05/26/18 0659  WBC 7.3  HGB 9.8*  HCT 30.7*  PLT 154   ------------------------------------------------------------------------------------------------------------------  Chemistries  Recent Labs  Lab 05/22/18 0557  05/26/18 0558  NA 135   < > 138  K 3.9   < > 3.6  CL 99   < > 106  CO2 30   < > 26  GLUCOSE 156*   < > 171*  BUN 10   < > 13  CREATININE 0.59   < > 0.55  CALCIUM 7.9*   < > 8.0*  MG 1.8  --   --   AST  --   --  70*  ALT  --   --  25  ALKPHOS  --   --  63  BILITOT  --   --  0.8   < > = values in this interval not displayed.   ------------------------------------------------------------------------------------------------------------------  Cardiac Enzymes No results for input(s): TROPONINI in the last 168  hours. ------------------------------------------------------------------------------------------------------------------  RADIOLOGY:  Dg Abd 1 View  Result Date: 05/25/2018 CLINICAL DATA:  Nasogastric tube placement EXAM: ABDOMEN - 1 VIEW COMPARISON:  Portable exam 1512 hours compared to 05/25/2018 at 1239 hours FINDINGS: Tip of nasogastric tube projects over the proximal stomach. Bowel gas pattern normal. LEFT basilar infiltrate identified. Bones demineralized. IMPRESSION: Nasogastric tube projects over the proximal stomach. LEFT basilar infiltrate. Electronically Signed   By: Ulyses SouthwardMark  Boles M.D.   On: 05/25/2018 15:21   Dg Abd 1 View  Result Date: 05/25/2018 CLINICAL DATA:  62 year old female status post NG tube placement. EXAM: ABDOMEN - 1 VIEW COMPARISON:  05/18/2018. portable chest radiograph 05/23/2018. FINDINGS: Portable AP upright view at 1239 hours. Enteric tube courses to the left upper quadrant and side hole is at the level of the gastric body. Confluent left lung base opacity. Normal cardiac size and mediastinal contours. More normal appearing right lung base. Visualized bowel gas pattern is non obstructed. IMPRESSION: 1. Enteric tube courses to the left upper quadrant and side hole at the level of the gastric body. 2. Progressed left lung base atelectasis and/or consolidation. Electronically Signed   By: Odessa FlemingH  Hall M.D.   On: 05/25/2018 12:50   Dg Chest Port 1 View  Result Date: 05/26/2018 CLINICAL DATA:  Respiratory failure EXAM: PORTABLE CHEST 1 VIEW COMPARISON:  05/23/2018 FINDINGS: New multifocal patchy opacities in the left upper lobe/lingula, suspicious for pneumonia. Mild bibasilar opacities, possibly atelectasis. No frank interstitial edema. Suspected small left pleural effusion. No pneumothorax. The heart is normal in size. Enteric tube courses into the stomach. IMPRESSION: Multifocal patchy opacities in the left upper lobe/lingula, new, suspicious for pneumonia Suspected small left  pleural effusion. Electronically Signed   By: Charline BillsSriyesh  Krishnan M.D.   On: 05/26/2018 05:47     ASSESSMENT AND PLAN:   62 year old female with a history of diabetes who presents to the emergency room due to labored breathing.  1.  Acute hypoxic respiratory failure with septic shock due to pneumonia Patient extubated 12/24 and weaned from BiPAP to nasal cannula Continue management as per intensivist Completed treatment for pneumonia  2.  UTI with E. coli sensitive to ceftriaxone Patient has been treated for UTI and antibiotics have been discontinued.    3.  Hyponatremia from dehydration: This is resolved  4.  Hypokalemia: Resolved  5.   diabetes: Sliding scale  6.  Nutrition: Currently with NG tube Failed speech evaluation   Palliative care consulted for goals of care.    Patient's overall prognosis still guarded and poor     CODE STATUS: FULL  TOTAL TIME TAKING CARE OF THIS PATIENT: 22 minutes.     POSSIBLE D/C ??, DEPENDING ON CLINICAL CONDITION.   Marc Leichter M.D on 05/26/2018 at 11:39 AM  Between 7am to 6pm - Pager - [phone] After 6pm go to [email or web address] - password [email or web address]  [facility]  Office  [phone]  CC: Primary care physician; Patient, No Pcp Per  Note: This dictation was prepared with Dragon dictation along with smaller phrase technology. Any transcriptional errors that result from this process are unintentional.

## 2018-05-26 NOTE — Care Management Note (Signed)
Case Management Note  Patient Details  Name: Lorinda CreedSharon A Pettway MRN: [id number] Date of Birth: 27-Apr-1956  Subjective/Objective: Patient is floor status in the ICU waiting on a bed.  Patient tolerating [place].  Patient did not pass her speech eval.  Barrier is NG tube.   Robbie LisJeanna Jahmeek Shirk RN BSN       [phone]           Action/Plan:   Expected Discharge Date:                  Expected Discharge Plan:  Rest Home([facility])  In-House Referral:     Discharge planning Services  CM Consult  Post Acute Care Choice:    Choice offered to:     DME Arranged:    DME Agency:     HH Arranged:    HH Agency:     Status of Service:  In process, will continue to follow  If discussed at Long Length of Stay Meetings, dates discussed:    Additional Comments:  Allayne ButcherJeanna M Kameela Leipold, RN 05/26/2018, 1:52 PM

## 2018-05-26 NOTE — Progress Notes (Signed)
Patient has maintained MAPs above 65 during night shift.  She remains on 2L [place], and has also remained free of pain during night shift.  Patient followed simple commands by griping and squeezing nurse's hands when asked to do so.  Patient appropriately nods her head with yes or no as a reply to simple questions (Ex. Are you in pain? or Are you comfortable?) She continues to have productive cough.  

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
## 2018-05-27 ENCOUNTER — Inpatient Hospital Stay: Payer: Medicare Other

## 2018-05-27 LAB — CBC
HCT: 33.4 % — ABNORMAL LOW (ref 36.0–46.0)
Hemoglobin: 11.1 g/dL — ABNORMAL LOW (ref 12.0–15.0)
MCH: 31.2 pg (ref 26.0–34.0)
MCHC: 33.2 g/dL (ref 30.0–36.0)
MCV: 93.8 fL (ref 80.0–100.0)
NRBC: 0 % (ref 0.0–0.2)
Platelets: 409 10*3/uL — ABNORMAL HIGH (ref 150–400)
RBC: 3.56 MIL/uL — ABNORMAL LOW (ref 3.87–5.11)
RDW: 12.8 % (ref 11.5–15.5)
WBC: 11.4 10*3/uL — ABNORMAL HIGH (ref 4.0–10.5)

## 2018-05-27 LAB — GLUCOSE, CAPILLARY
Glucose-Capillary: 135 mg/dL — ABNORMAL HIGH (ref 70–99)
Glucose-Capillary: 162 mg/dL — ABNORMAL HIGH (ref 70–99)
Glucose-Capillary: 165 mg/dL — ABNORMAL HIGH (ref 70–99)
Glucose-Capillary: 166 mg/dL — ABNORMAL HIGH (ref 70–99)
Glucose-Capillary: 177 mg/dL — ABNORMAL HIGH (ref 70–99)
Glucose-Capillary: 202 mg/dL — ABNORMAL HIGH (ref 70–99)

## 2018-05-27 NOTE — Progress Notes (Signed)
Pt removed NG tube again. NG tube placed. CXR ordered per protocol.

## 2018-05-27 NOTE — Progress Notes (Signed)
At approximately 1510 pt removed her NG tube. This nurse notified the MD and placed a new NG tube and ordered a CXR per protocol.

## 2018-05-27 NOTE — Progress Notes (Signed)
[facility] - [place] at [facility]   PATIENT NAME: Kaitlyn Ruiz    MR#:  [id number]  DATE OF BIRTH:  18-Dec-1955  SUBJECTIVE:  Patient seen and evaluated today On oxygen via nasal cannula On medical floor transferred from ICU With NG tube with feeds REVIEW OF SYSTEMS:    unable to obtain secondary to encephalopathy  DRUG ALLERGIES:  No Known Allergies  VITALS:  Blood pressure 122/69, pulse 87, temperature 98.2 F (36.8 C), temperature source Oral, resp. rate 18, height 5\' 6"  (1.676 m), weight 93.5 kg, SpO2 92 %.  PHYSICAL EXAMINATION:  Constitutional: Appears well-developed and well-nourished. HEENT: Normocephalic. Marland Kitchen. eyes: Conjunctivae  normal. no scleral icterus.  NG tube noted Neck: Normal ROM. Neck supple. No JVD. No tracheal deviation. CVS: RRR, S1/S2 +, no murmurs, no gallops, no carotid bruit.  Pulmonary: Effort and breath sounds normal, no rhonchi, wheezes, rales.  Abdominal: Soft. BS +,  no distension, tenderness, rebound or guarding.  Musculoskeletal: . No edema and no tenderness.  Neuro: Awake, not completely oriented to time place and person Moves upper and lower extremities on verbal commands Skin: Skin is warm and dry. No rash noted.   LABORATORY PANEL:   CBC Recent Labs  Lab 05/27/18 0902  WBC 11.4*  HGB 11.1*  HCT 33.4*  PLT 409*   ------------------------------------------------------------------------------------------------------------------  Chemistries  Recent Labs  Lab 05/22/18 0557  05/26/18 0558  NA 135   < > 138  K 3.9   < > 3.6  CL 99   < > 106  CO2 30   < > 26  GLUCOSE 156*   < > 171*  BUN 10   < > 13  CREATININE 0.59   < > 0.55  CALCIUM 7.9*   < > 8.0*  MG 1.8  --   --   AST  --   --  70*  ALT  --   --  25  ALKPHOS  --   --  63  BILITOT  --   --  0.8   < > = values in this interval not displayed.    ------------------------------------------------------------------------------------------------------------------  Cardiac Enzymes No results for input(s): TROPONINI in the last 168 hours. ------------------------------------------------------------------------------------------------------------------  RADIOLOGY:  Dg Abd 1 View  Result Date: 05/25/2018 CLINICAL DATA:  Nasogastric tube placement EXAM: ABDOMEN - 1 VIEW COMPARISON:  Portable exam 1512 hours compared to 05/25/2018 at 1239 hours FINDINGS: Tip of nasogastric tube projects over the proximal stomach. Bowel gas pattern normal. LEFT basilar infiltrate identified. Bones demineralized. IMPRESSION: Nasogastric tube projects over the proximal stomach. LEFT basilar infiltrate. Electronically Signed   By: Ulyses SouthwardMark  Boles M.D.   On: 05/25/2018 15:21   Dg Abd 1 View  Result Date: 05/25/2018 CLINICAL DATA:  62 year old female status post NG tube placement. EXAM: ABDOMEN - 1 VIEW COMPARISON:  05/18/2018. portable chest radiograph 05/23/2018. FINDINGS: Portable AP upright view at 1239 hours. Enteric tube courses to the left upper quadrant and side hole is at the level of the gastric body. Confluent left lung base opacity. Normal cardiac size and mediastinal contours. More normal appearing right lung base. Visualized bowel gas pattern is non obstructed. IMPRESSION: 1. Enteric tube courses to the left upper quadrant and side hole at the level of the gastric body. 2. Progressed left lung base atelectasis and/or consolidation. Electronically Signed   By: Odessa FlemingH  Hall M.D.   On: 05/25/2018 12:50   Dg Chest Port 1 View  Result Date: 05/26/2018 CLINICAL DATA:  Respiratory failure  EXAM: PORTABLE CHEST 1 VIEW COMPARISON:  05/23/2018 FINDINGS: New multifocal patchy opacities in the left upper lobe/lingula, suspicious for pneumonia. Mild bibasilar opacities, possibly atelectasis. No frank interstitial edema. Suspected small left pleural effusion. No pneumothorax.  The heart is normal in size. Enteric tube courses into the stomach. IMPRESSION: Multifocal patchy opacities in the left upper lobe/lingula, new, suspicious for pneumonia Suspected small left pleural effusion. Electronically Signed   By: Charline BillsSriyesh  Krishnan M.D.   On: 05/26/2018 05:47     ASSESSMENT AND PLAN:   62 year old female with a history of diabetes who presents to the emergency room due to labored breathing.  1.  S/p  hypoxic respiratory failure secondary to pneumonia Patient extubated 12/24 and weaned from BiPAP to nasal cannula and currently on oxygen via nasal cannula Transferred to medical floor Treated for pneumonia with antibiotics  2.  Status post septic shock Off IV pressors and IV fluids Blood pressure has been stable  3.  Dysphagia Failed swallow study We will repeat swallow study again on Monday and based on the results will plan for feeding tube if needed after discussion with family Currently continue NG tube with feeds  4.  UTI with E. coli sensitive to ceftriaxone Patient has been treated for UTI and completed course of antibiotics .  5.  Hyponatremia from dehydration has resolved  6.  Hypokalemia: Resolved  7.   diabetes: Sliding scale  8.  Transient thrombocytopenia resolved   Palliative care consulted.   Patient's overall prognosis still guarded and poor  CODE STATUS: FULL  TOTAL TIME TAKING CARE OF THIS PATIENT: 34 minutes.    POSSIBLE D/C  DEPENDING ON CLINICAL CONDITION.   Ihor AustinPavan  M.D on 05/27/2018 at 11:31 AM  Between 7am to 6pm - Pager - [phone] After 6pm go to [email or web address] - password [email or web address]  [facility] [place] [facility]  Office  [phone]  CC: Primary care physician; Patient, No Pcp Per  Note: This dictation was prepared with Dragon dictation along with smaller phrase technology. Any transcriptional errors that result from this process are unintentional.

## 2018-05-28 DIAGNOSIS — R1312 Dysphagia, oropharyngeal phase: Secondary | ICD-10-CM

## 2018-05-28 LAB — GLUCOSE, CAPILLARY
Glucose-Capillary: 139 mg/dL — ABNORMAL HIGH (ref 70–99)
Glucose-Capillary: 161 mg/dL — ABNORMAL HIGH (ref 70–99)
Glucose-Capillary: 171 mg/dL — ABNORMAL HIGH (ref 70–99)
Glucose-Capillary: 197 mg/dL — ABNORMAL HIGH (ref 70–99)
Glucose-Capillary: 198 mg/dL — ABNORMAL HIGH (ref 70–99)
Glucose-Capillary: 228 mg/dL — ABNORMAL HIGH (ref 70–99)
Glucose-Capillary: 266 mg/dL — ABNORMAL HIGH (ref 70–99)

## 2018-05-28 LAB — CREATININE, SERUM
Creatinine, Ser: 0.57 mg/dL (ref 0.44–1.00)
GFR calc Af Amer: >60 mL/min (ref >60)
GFR calc non Af Amer: >60 mL/min (ref >60)

## 2018-05-28 NOTE — Plan of Care (Signed)
  Problem: Activity: Goal: Ability to tolerate increased activity will improve Outcome: Progressing   Problem: Respiratory: Goal: Ability to maintain a clear airway and adequate ventilation will improve Outcome: Progressing   Problem: Role Relationship: Goal: Method of communication will improve Outcome: Progressing   Problem: Education: Goal: Knowledge of General Education information will improve Description Including pain rating scale, medication(s)/side effects and non-pharmacologic comfort measures Outcome: Progressing   Problem: Clinical Measurements: Goal: Ability to maintain clinical measurements within normal limits will improve Outcome: Progressing Goal: Diagnostic test results will improve Outcome: Progressing Goal: Respiratory complications will improve Outcome: Progressing Goal: Cardiovascular complication will be avoided Outcome: Progressing   Problem: Activity: Goal: Risk for activity intolerance will decrease Outcome: Progressing   Problem: Nutrition: Goal: Adequate nutrition will be maintained Outcome: Progressing   Problem: Pain Managment: Goal: General experience of comfort will improve Outcome: Progressing   Problem: Safety: Goal: Ability to remain free from injury will improve Outcome: Progressing   Problem: Skin Integrity: Goal: Risk for impaired skin integrity will decrease Outcome: Progressing

## 2018-05-28 NOTE — Progress Notes (Addendum)
[facility] - [place] at [facility]   PATIENT NAME: Kaitlyn Ruiz    MR#:  [id number]  DATE OF BIRTH:  [birth year]  SUBJECTIVE:  Patient seen and evaluated today On oxygen via nasal cannula Has NG tube feeds Patient failed swallow study RN today after speech therapy evaluated her Recommended to be n.p.o.  REVIEW OF SYSTEMS:   Review of Systems  Constitutional: Negative.   HENT: Negative.   Eyes: Negative.   Respiratory: Negative.   Cardiovascular: Negative.   Gastrointestinal: Negative.   Genitourinary: Negative.   Musculoskeletal: Negative.   Skin: Negative.   Neurological: Responds to verbal commands Moves upper and lower extremities Slow to respond but awake and verbalizes Endo/Heme/Allergies: Negative.   Psychiatric/Behavioral: Negative.   All other systems reviewed and are negative.  DRUG ALLERGIES:  No Known Allergies  VITALS:  Blood pressure 131/78, pulse 89, temperature 98.3 F (36.8 C), temperature source Oral, resp. rate 18, height 5\' 6"  (1.676 m), weight 93.5 kg, SpO2 95 %.  PHYSICAL EXAMINATION:  Constitutional: Appears well-developed and well-nourished. HEENT: Normocephalic. Marland Kitchen. eyes: Conjunctivae  normal. no scleral icterus.  NG tube noted Neck: Normal ROM. Neck supple. No JVD. No tracheal deviation. CVS: RRR, S1/S2 +, no murmurs, no gallops, no carotid bruit.  Pulmonary: Effort and breath sounds normal, no rhonchi, wheezes, rales.  Abdominal: Soft. BS +,  no distension, tenderness, rebound or guarding.  Musculoskeletal: . No edema and no tenderness.  Neuro: Awake, oriented to self and place but not time Moves upper and lower extremities on verbal commands Skin: Skin is warm and dry. No rash noted.   LABORATORY PANEL:   CBC Recent Labs  Lab 05/27/18 0902  WBC 11.4*  HGB 11.1*  HCT 33.4*  PLT 409*   ------------------------------------------------------------------------------------------------------------------  Chemistries   Recent Labs  Lab 05/22/18 0557  05/26/18 0558 05/28/18 0456  NA 135   < > 138  --   K 3.9   < > 3.6  --   CL 99   < > 106  --   CO2 30   < > 26  --   GLUCOSE 156*   < > 171*  --   BUN 10   < > 13  --   CREATININE 0.59   < > 0.55 0.57  CALCIUM 7.9*   < > 8.0*  --   MG 1.8  --   --   --   AST  --   --  70*  --   ALT  --   --  25  --   ALKPHOS  --   --  63  --   BILITOT  --   --  0.8  --    < > = values in this interval not displayed.   ------------------------------------------------------------------------------------------------------------------  Cardiac Enzymes No results for input(s): TROPONINI in the last 168 hours. ------------------------------------------------------------------------------------------------------------------  RADIOLOGY:  Dg Chest 1 View  Result Date: 05/27/2018 CLINICAL DATA:  Status post nasogastric tube placement. EXAM: CHEST  1 VIEW COMPARISON:  Radiograph of May 27, 2018. FINDINGS: Stable cardiomediastinal silhouette. Interval placement of nasogastric tube with distal tip in expected position of proximal stomach. Stable bibasilar atelectasis or infiltrates are noted, left greater than right. No pneumothorax is noted. Bony thorax is unremarkable. IMPRESSION: Interval placement of nasogastric tube with distal tip in expected position of proximal stomach. Stable bibasilar opacities as described above. Electronically Signed   By: Lupita RaiderJames  Green Jr, M.D.   On: 05/27/2018 18:10   Dg  Chest 1 View  Result Date: 05/27/2018 CLINICAL DATA:  Nasogastric tube placement. EXAM: CHEST  1 VIEW COMPARISON:  Radiograph of May 26, 2018. FINDINGS: Stable cardiomegaly. No pneumothorax is noted. Stable left upper and lower lobe opacities are noted concerning for pneumonia or atelectasis with small associated pleural effusion. No nasogastric tube is noted on this study. Minimal right basilar subsegmental atelectasis is noted. Bony thorax is unremarkable. IMPRESSION:  Stable left lung opacities are noted concerning for pneumonia or possibly atelectasis with associated pleural effusion. No nasogastric tube is noted. Electronically Signed   By: Lupita RaiderJames  Green Jr, M.D.   On: 05/27/2018 16:14     ASSESSMENT AND PLAN:   62 year old female with a history of diabetes who presents to the emergency room due to labored breathing.  1.  S/p  hypoxic respiratory failure secondary to pneumonia Patient extubated 12/24 and weaned from BiPAP to nasal cannula and currently on oxygen via nasal cannula Transferred to medical floor Treated for pneumonia with antibiotics  2.  Status post septic shock Off IV pressors and IV fluids Blood pressure has been stable  3.  Dysphagia Failed swallow study again this morning after speech therapy evaluated the patient Currently continue NG tube with feeds Will discuss with patient's family regarding alternate methods of feeding such as feeding tube  4.  UTI with E. coli sensitive to ceftriaxone Patient has been treated for UTI and completed course of antibiotics .  5.  Hyponatremia from dehydration has resolved  6.  Hypokalemia: Resolved  7.   diabetes: Sliding scale  8.  Transient thrombocytopenia resolved  9.Informed patient family medical condition and treatment plan today   Palliative care consulted.   Patient's overall prognosis still guarded and poor considering multiple comorbidities  CODE STATUS: FULL  TOTAL TIME TAKING CARE OF THIS PATIENT: 33 minutes.    POSSIBLE D/C  DEPENDING ON CLINICAL CONDITION.   Ihor AustinPavan  M.D on 05/28/2018 at 11:23 AM  Between 7am to 6pm - Pager - [phone] After 6pm go to [email or web address] - password [email or web address]  [facility] [place] [facility]  Office  [phone]  CC: Primary care physician; Patient, No Pcp Per  Note: This dictation was prepared with Dragon dictation along with smaller phrase technology. Any transcriptional errors that result from this process are  unintentional.
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
## 2018-05-28 NOTE — [place] FL2 (Signed)
[place] MEDICAID FL2 LEVEL OF CARE SCREENING TOOL     IDENTIFICATION  Patient Name: Kaitlyn CreedSharon A Burgin Birthdate: Nov 11, 1955 Sex: female Admission Date (Current Location): 05/14/2018  [place]   Facility and Address:  [facility], [address], [place], [place] [address]      Provider Number: [id number]  Attending Physician Name and Address:  Ihor AustinPyreddy, Pavan, MD  Relative Name and Phone Number:  Cleopatra CedarLavoris Benbow (Sister) [phone]    Current Level of Care: Hospital Recommended Level of Care: Skilled Nursing Facility Prior Approval Number:    Date Approved/Denied:   PASRR Number: [id number]  Discharge Plan: SNF    Current Diagnoses: Patient Active Problem List   Diagnosis Date Noted  . Neurological dysfunction   . Endotracheal tube present   . Palliative care encounter   . Acute respiratory failure with hypoxemia (HCC) 05/14/2018  . Acute metabolic encephalopathy   . Septic shock (HCC) 04/10/2017    Orientation RESPIRATION BLADDER Height & Weight     Self, Time, Situation, Place  O2(2L o2) Incontinent Weight: 206 lb 3.2 oz (93.5 kg) Height:  5\' 6"  (167.6 cm)  BEHAVIORAL SYMPTOMS/MOOD NEUROLOGICAL BOWEL NUTRITION STATUS      Incontinent (Currently NG, will discharge with feeding tube.)  AMBULATORY STATUS COMMUNICATION OF NEEDS Skin   Extensive Assist Non-Verbally Normal                       Personal Care Assistance Level of Assistance  Bathing, Feeding, Dressing Bathing Assistance: Maximum assistance Feeding assistance: Maximum assistance Dressing Assistance: Maximum assistance     Functional Limitations Info  Sight, Hearing, Speech Sight Info: Adequate Hearing Info: Adequate Speech Info: Impaired    SPECIAL CARE FACTORS FREQUENCY                       Contractures Contractures Info: Not present    Additional Factors Info  Code Status, Allergies Code Status Info:  Full Allergies Info: No Known Allergies           Current Medications (05/28/2018):  This is the current hospital active medication list Current Facility-Administered Medications  Medication Dose Route Frequency Provider Last Rate Last Dose  . 0.9 %  sodium chloride infusion   Intravenous PRN Andreas Ohmukov-Yual, Magdalene S, NP   Stopped at 05/24/18 2149  . acetaminophen (TYLENOL) tablet 650 mg  650 mg Per Tube Q6H PRN Erin FullingKasa, Kurian, MD   650 mg at 05/22/18 2031   Or  . acetaminophen (TYLENOL) suppository 650 mg  650 mg Rectal Q6H PRN Erin FullingKasa, Kurian, MD   650 mg at 05/24/18 1618  . aspirin chewable tablet 81 mg  81 mg Per Tube Daily Erin FullingKasa, Kurian, MD   81 mg at 05/26/18 0946  . enoxaparin (LOVENOX) injection 40 mg  40 mg Subcutaneous Q24H Erin FullingKasa, Kurian, MD   40 mg at 05/27/18 2056  . feeding supplement (OSMOLITE 1.5 CAL) liquid 1,000 mL  1,000 mL Per Tube Continuous Pyreddy, Pavan, MD 50 mL/hr at 05/28/18 1105 1,000 mL at 05/28/18 1105  . feeding supplement (PRO-STAT SUGAR FREE 64) liquid 30 mL  30 mL Per Tube BID Merwyn KatosSimonds, David B, MD   30 mL at 05/28/18 0841  . free water 150 mL  150 mL Per Tube Q4H Salena SanerGonzalez, Carmen L, MD   150 mL at 05/28/18 1200  . insulin aspart (novoLOG) injection 0-15 Units  0-15 Units Subcutaneous Q4H Merwyn KatosSimonds, David B, MD  5 Units at 05/28/18 1226  . ipratropium-albuterol (DUONEB) 0.5-2.5 (3) MG/3ML nebulizer solution 3 mL  3 mL Nebulization Q6H Tukov-Yual, Magdalene S, NP   3 mL at 05/28/18 0842  . MEDLINE mouth rinse  15 mL Mouth Rinse q12n4p Harlon DittyKeene, Jeremiah D, NP   15 mL at 05/27/18 1714  . ondansetron (ZOFRAN) injection 4 mg  4 mg Intravenous Q6H PRN Erin FullingKasa, Kurian, MD   4 mg at 05/20/18 1410  . polyethylene glycol (MIRALAX / GLYCOLAX) packet 17 g  17 g Per NG tube Daily PRN Salena SanerGonzalez, Carmen L, MD         Discharge Medications: Please see discharge summary for a list of discharge medications.  Relevant Imaging Results:  Relevant Lab Results:   Additional  Information SS#[id number]  Judi CongKaren M Calloway Andrus, LCSW

## 2018-05-28 NOTE — Clinical Social Work Note (Signed)
Clinical Social Work Assessment  Patient Details  Name: Kaitlyn Ruiz MRN: [id number] Date of Birth: January 10, 1956  Date of referral:  05/28/18               Reason for consult:  Facility Placement                Permission sought to share information with:  Facility Industrial/product designerContact Representative Permission granted to share information::  Yes, Verbal Permission Granted  Name::        Agency::  [facility]  Relationship::     Contact Information:     Housing/Transportation Living arrangements for the past 2 months:  Skilled Nursing Facility Source of Information:  Medical Team, Facility Patient Interpreter Needed:  None Criminal Activity/Legal Involvement Pertinent to Current Situation/Hospitalization:  No - Comment as needed Significant Relationships:  Siblings, Other Family Members, Community Support Lives with:  Facility Resident Do you feel safe going back to the place where you live?  Yes Need for family participation in patient care:  Yes (Comment)(Patient currently non-verbal)  Care giving concerns: Patient a resident of [facility]   Social Worker assessment / plan:  CSW saw in chart review that patient admitted from [facility]. The patient is a LTC resident and can return provided that she is not remaining on NG tube for feeding. Per the admissions coordinator, the patient can return with a feeding tube. The patient has failed swallow study and is NPO at this time.  Currently, palliative care consult is pending, and the patient's prognosis is poor. CSW is following for discharge facilitation when the patient is stable.   Employment status:  Disabled (Comment on whether or not currently receiving Disability) Insurance information:  Managed Medicare PT Recommendations:  Not assessed at this time Information / Referral to community resources:     Patient/Family's Response to care: The facility was able to answer questions about the  patient's ability to discharge.  Patient/Family's Understanding of and Emotional Response to Diagnosis, Current Treatment, and Prognosis:  The family is in discussions about options for ongoing treatment.  Emotional Assessment Appearance:  Appears stated age Attitude/Demeanor/Rapport:  Lethargic Affect (typically observed):  Stable Orientation:  Oriented to Self, Oriented to Place, Oriented to  Time, Oriented to Situation Alcohol / Substance use:  Never Used Psych involvement (Current and /or in the community):  No (Comment)  Discharge Needs  Concerns to be addressed:  Discharge Planning Concerns, Care Coordination Readmission within the last 30 days:  No Current discharge risk:  Chronically ill, Physical Impairment Barriers to Discharge:  Continued Medical Work up   [employer] CorporationKaren M Lenny Bouchillon, LCSW 05/28/2018, 2:16 PM

## 2018-05-29 ENCOUNTER — Inpatient Hospital Stay: Payer: Medicare Other | Admitting: Anesthesiology

## 2018-05-29 ENCOUNTER — Encounter
Admission: EM | Disposition: A | Discharge status: Skilled Nursing Facility | Payer: Self-pay | Source: Skilled Nursing Facility | Attending: Internal Medicine

## 2018-05-29 ENCOUNTER — Encounter: Payer: Self-pay | Admitting: Anesthesiology

## 2018-05-29 DIAGNOSIS — R1310 Dysphagia, unspecified: Secondary | ICD-10-CM

## 2018-05-29 DIAGNOSIS — R1319 Other dysphagia: Secondary | ICD-10-CM

## 2018-05-29 HISTORY — PX: PEG PLACEMENT: SHX5437

## 2018-05-29 LAB — CBC
HCT: 31.9 % — ABNORMAL LOW (ref 36.0–46.0)
HEMOGLOBIN: 10.5 g/dL — AB (ref 12.0–15.0)
MCH: 31.1 pg (ref 26.0–34.0)
MCHC: 32.9 g/dL (ref 30.0–36.0)
MCV: 94.4 fL (ref 80.0–100.0)
NRBC: 0 % (ref 0.0–0.2)
Platelets: 434 10*3/uL — ABNORMAL HIGH (ref 150–400)
RBC: 3.38 MIL/uL — ABNORMAL LOW (ref 3.87–5.11)
RDW: 13.2 % (ref 11.5–15.5)
WBC: 12.1 10*3/uL — ABNORMAL HIGH (ref 4.0–10.5)

## 2018-05-29 LAB — BASIC METABOLIC PANEL
Anion gap: 5 (ref 5–15)
BUN: 10 mg/dL (ref 6–20)
CO2: 28 mmol/L (ref 22–32)
Calcium: 8.1 mg/dL — ABNORMAL LOW (ref 8.9–10.3)
Chloride: 103 mmol/L (ref 98–111)
Creatinine, Ser: 0.5 mg/dL (ref 0.44–1.00)
GFR calc Af Amer: >60 mL/min (ref >60)
GFR calc non Af Amer: >60 mL/min (ref >60)
Glucose, Bld: 127 mg/dL — ABNORMAL HIGH (ref 70–99)
POTASSIUM: 3.2 mmol/L — AB (ref 3.5–5.1)
Sodium: 136 mmol/L (ref 135–145)

## 2018-05-29 LAB — GLUCOSE, CAPILLARY
Glucose-Capillary: 104 mg/dL — ABNORMAL HIGH (ref 70–99)
Glucose-Capillary: 124 mg/dL — ABNORMAL HIGH (ref 70–99)
Glucose-Capillary: 130 mg/dL — ABNORMAL HIGH (ref 70–99)
Glucose-Capillary: 104 mg/dL — ABNORMAL HIGH (ref 70–99)

## 2018-05-29 SURGERY — INSERTION, PEG TUBE
Anesthesia: General

## 2018-05-29 MED ORDER — GABAPENTIN 250 MG/5ML PO SOLN
300.0000 mg | Freq: Three times a day (TID) | ORAL | Status: DC
  Administered 2018-05-29 – 2018-06-01 (×8): 300 mg
  Filled 2018-05-29 (×10): qty 6

## 2018-05-29 MED ORDER — MAGNESIUM HYDROXIDE 400 MG/5ML PO SUSP
30.0000 mL | Freq: Every day | ORAL | Status: DC | PRN
  Filled 2018-05-29: qty 30

## 2018-05-29 MED ORDER — BISACODYL 10 MG RE SUPP
10.0000 mg | Freq: Every day | RECTAL | Status: DC | PRN
  Filled 2018-05-29: qty 1

## 2018-05-29 MED ORDER — PRAVASTATIN SODIUM 20 MG PO TABS
20.0000 mg | ORAL_TABLET | Freq: Every day | ORAL | Status: DC
  Administered 2018-05-29 – 2018-05-31 (×3): 20 mg
  Filled 2018-05-29 (×3): qty 1

## 2018-05-29 MED ORDER — PROPOFOL 500 MG/50ML IV EMUL
INTRAVENOUS | Status: DC | PRN
  Administered 2018-05-29: 120 ug/kg/min via INTRAVENOUS

## 2018-05-29 MED ORDER — CEFAZOLIN SODIUM-DEXTROSE 2-4 GM/100ML-% IV SOLN
2.0000 g | Freq: Once | INTRAVENOUS | Status: DC
  Administered 2018-05-29: 2 g via INTRAVENOUS
  Filled 2018-05-29: qty 100

## 2018-05-29 MED ORDER — PROPOFOL 500 MG/50ML IV EMUL
INTRAVENOUS | Status: AC
  Filled 2018-05-29: qty 50

## 2018-05-29 MED ORDER — GLYCOPYRROLATE 0.2 MG/ML IJ SOLN
INTRAMUSCULAR | Status: DC | PRN
  Administered 2018-05-29: 0.2 mg via INTRAVENOUS

## 2018-05-29 MED ORDER — LISINOPRIL 5 MG PO TABS
2.5000 mg | ORAL_TABLET | Freq: Every day | ORAL | Status: DC
  Administered 2018-05-30 – 2018-06-01 (×3): 2.5 mg
  Filled 2018-05-29 (×3): qty 1

## 2018-05-29 MED ORDER — PROPOFOL 10 MG/ML IV BOLUS
INTRAVENOUS | Status: DC | PRN
  Administered 2018-05-29: 40 mg via INTRAVENOUS
  Administered 2018-05-29: 30 mg via INTRAVENOUS

## 2018-05-29 MED ORDER — POLYVINYL ALCOHOL 1.4 % OP SOLN
1.0000 [drp] | OPHTHALMIC | Status: DC | PRN
  Filled 2018-05-29: qty 15

## 2018-05-29 MED ORDER — LIDOCAINE 2% (20 MG/ML) 5 ML SYRINGE
INTRAMUSCULAR | Status: DC | PRN
  Administered 2018-05-29: 100 mg via INTRAVENOUS

## 2018-05-29 MED ORDER — VALPROIC ACID 250 MG/5ML PO SOLN
125.0000 mg | Freq: Two times a day (BID) | ORAL | Status: DC
  Administered 2018-05-29 – 2018-06-01 (×6): 125 mg
  Filled 2018-05-29 (×7): qty 5

## 2018-05-29 MED ORDER — LIDOCAINE HCL (PF) 1 % IJ SOLN
INTRAMUSCULAR | Status: DC | PRN
  Administered 2018-05-29: 1 mL

## 2018-05-29 MED ORDER — ADULT MULTIVITAMIN LIQUID CH
15.0000 mL | Freq: Every day | ORAL | Status: DC
  Administered 2018-05-29: 15 mL
  Filled 2018-05-29 (×2): qty 15

## 2018-05-29 MED ORDER — POTASSIUM CHLORIDE CRYS ER 20 MEQ PO TBCR
40.0000 meq | EXTENDED_RELEASE_TABLET | Freq: Once | ORAL | Status: AC
  Administered 2018-05-29: 18:00:00 40 meq via ORAL
  Filled 2018-05-29: qty 2

## 2018-05-29 MED ORDER — DOCUSATE SODIUM 50 MG/5ML PO LIQD
100.0000 mg | Freq: Two times a day (BID) | ORAL | Status: DC
  Administered 2018-05-29 – 2018-05-31 (×5): 100 mg
  Filled 2018-05-29 (×7): qty 10

## 2018-05-29 NOTE — Op Note (Addendum)
[facility]astroenterology Patient Name: Kaitlyn Ruiz Procedure Date: 05/29/2018 12:09 PM MRN: [id number] Account #: [id number] Date of Birth: 1955/07/02 Admit Type: Inpatient Age: 62 Room: [facility] ENDO ROOM 4 Gender: Female Note Status: Addendum Procedure:            Upper GI endoscopy Indications:          Dysphagia Providers:            Victorious Kundinger B. Maximino Greenland MD, MD Referring MD:         Sallye Lat Md, MD (Referring MD) Medicines:            Monitored Anesthesia Care Complications:        No immediate complications. Procedure:            Pre-Anesthesia Assessment:                       - Prior to the procedure, a History and Physical was                        performed, and patient medications, allergies and                        sensitivities were reviewed. The patient's tolerance of                        previous anesthesia was reviewed.                       - The risks and benefits of the procedure and the                        sedation options and risks were discussed with the                        patient. All questions were answered and informed                        consent was obtained.                       - Patient identification and proposed procedure were                        verified prior to the procedure by the physician, the                        nurse, the anesthesiologist, the anesthetist and the                        technician. The procedure was verified in the procedure                        room.                       - ASA Grade Assessment: II - A patient with mild                        systemic disease.                       After  obtaining informed consent, the endoscope was                        passed under direct vision. Throughout the procedure,                        the patient's blood pressure, pulse, and oxygen                        saturations were monitored continuously. The Endoscope                        was  introduced through the mouth, and advanced to the                        second part of duodenum. The upper GI endoscopy was                        accomplished with ease. The patient tolerated the                        procedure well. Findings:      The examined esophagus was normal.      The entire examined stomach was normal. The patient was placed in the       supine position for PEG placement. The stomach was insufflated to appose       gastric and abdominal walls. A site was located in the body of the       stomach with excellent transillumination and manual external pressure       for placement. The abdominal wall was marked and prepped in a sterile       manner. The area was anesthetized with 0.5% lidocaine. The trocar needle       was introduced through the abdominal wall and into the stomach under       direct endoscopic view. A snare was introduced through the endoscope and       opened in the gastric lumen. The guide wire was passed through the       trocar and into the open snare. The snare was closed around the guide       wire. The endoscope and snare were removed, pulling the wire out through       the mouth. A skin incision was made at the site of needle insertion. The       20 Fr EndoVive Safety gastrostomy tube was lubricated. The G-tube was       tied to the guide wire and pulled through the mouth and into the       stomach. The trocar needle was removed, and the gastrostomy tube was       pulled out from the stomach through the skin. The external bumper was       attached to the gastrostomy tube, and the tube was cut to remove the       guide wire. The final position of the gastrostomy tube was confirmed by       relook endoscopy, and skin marking noted to be 5.5 cm at the external       bumper. The final tension and compression of the abdominal wall by the       PEG tube and external bumper were checked and revealed that the bumper  was loose and lightly  touching the skin. The feeding tube was capped,       and the tube site cleaned and dressed.      The duodenal bulb, second portion of the duodenum and examined duodenum       were normal. Impression:           - Normal esophagus.                       - Normal stomach.                       - Normal duodenal bulb, second portion of the duodenum                        and examined duodenum.                       - A PEG placement was successfully completed.                       - No specimens collected. Recommendation:       - Please follow the post-PEG recommendations including:                        external bolster 1 cm from abdominal wall, change                        dressing once per day, change dressing on top of bumper                        daily, may use PEG today for meds and water, may use                        PEG tomorrow for feedings, antibiotic ointment to site,                        clean site with soap and water daily and dry thoroughly                        and clean site daily with half-strength hydrogen                        peroxide for three days, then soap and water daily.                       - Continue present medications.                       - Patient has a contact number available for                        emergencies. The signs and symptoms of potential                        delayed complications were discussed with the patient.                        Return to normal activities tomorrow. Written discharge  instructions were provided to the patient.                       - The findings and recommendations were discussed with                        the patient.                       - The findings and recommendations were discussed with                        the patient's family. Procedure Code(s):    --- Professional ---                       [phone], Esophagogastroduodenoscopy, flexible, transoral;                         with directed placement of percutaneous gastrostomy tube Diagnosis Code(s):    --- Professional ---                       R13.10, Dysphagia, unspecified CPT copyright 2018 American Medical Association. All rights reserved. The codes documented in this report are preliminary and upon coder review may  be revised to meet current compliance requirements.  Melodie Bouillon, MD Michel Bickers B. Maximino Greenland MD, MD 05/29/2018 12:46:17 PM This report has been signed electronically. Number of Addenda: 1 Note Initiated On: 05/29/2018 12:09 PM Estimated Blood Loss: Estimated blood loss: none.      [facility] Addendum Number: 1   Addendum Date: 05/30/2018 5:05:00 PM      The procedure was performed by Dr. Servando Snare and Dr. Maximino Greenland.      Dr. Maximino Greenland performed the EGD portion of the procedure. Dr. Servando Snare       performed the PEG portion of the procedure, including incision for PEG       tube placement etc.  Melodie Bouillon, MD Michel Bickers B. Maximino Greenland MD, MD 05/30/2018 5:06:17 PM This report has been signed electronically.

## 2018-05-29 NOTE — Care Management Important Message (Signed)
Important Message  Patient Details  Name: Kaitlyn CreedSharon A Barfoot MRN: [id number] Date of Birth: 1956/01/12   Medicare Important Message Given:  Other (see comment) Patient unable to sign Important Message from Medicare and no family in the room.  Copy reviewed with patient and left at bedside.   Olegario MessierKathy A Casha Estupinan 05/29/2018, 10:46 AM
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
## 2018-05-29 NOTE — Anesthesia Post-op Follow-up Note (Signed)
Anesthesia QCDR form completed.        

## 2018-05-29 NOTE — Transfer of Care (Signed)
Immediate Anesthesia Transfer of Care Note  Patient: Kaitlyn Ruiz  Procedure(s) Performed: PERCUTANEOUS ENDOSCOPIC GASTROSTOMY (PEG) PLACEMENT (N/A )  Patient Location: Endoscopy Unit  Anesthesia Type:General  Level of Consciousness: sedated  Airway & Oxygen Therapy: Patient connected to nasal cannula oxygen  Post-op Assessment: Post -op Vital signs reviewed and stable  Post vital signs: stable  Last Vitals:  Vitals Value Taken Time  BP    Temp    Pulse    Resp    SpO2      Last Pain:  Vitals:   05/29/18 1147  TempSrc: Tympanic  PainSc:       Patients Stated Pain Goal: 0 (05/20/18 2040)  Complications: No apparent anesthesia complications

## 2018-05-29 NOTE — Progress Notes (Addendum)
[employer] [facility] [place] [facility]   PATIENT NAME: Kaitlyn OleaSharon Ruiz    MR#:  [id number]  DATE OF BIRTH:  1955-11-26  SUBJECTIVE:  Patient seen and evaluated today On oxygen via nasal cannula Has NG tube feeds currently Patient failed swallow study for the second time PEG tube placement today if okay with family by gastroenterology   REVIEW OF SYSTEMS:   Review of Systems  Constitutional: Negative.   HENT: Negative.   Eyes: Negative.   Respiratory: Negative.   Cardiovascular: Negative.   Gastrointestinal: Negative.   Genitourinary: Negative.   Musculoskeletal: Negative.   Skin: Negative.   Neurological: Responds to verbal commands Moves upper and lower extremities Slow to respond but awake and verbalizes Endo/Heme/Allergies: Negative.   Psychiatric/Behavioral: Negative.   All other systems reviewed and are negative.  DRUG ALLERGIES:  No Known Allergies  VITALS:  Blood pressure 130/76, pulse 88, temperature 98.5 F (36.9 C), temperature source Oral, resp. rate (!) 22, height 5\' 6"  (1.676 m), weight 93.5 kg, SpO2 100 %.  PHYSICAL EXAMINATION:  Constitutional: Appears well-developed and well-nourished. HEENT: Normocephalic. Marland Kitchen. eyes: Conjunctivae  normal. no scleral icterus.  NG tube noted Neck: Normal ROM. Neck supple. No JVD. No tracheal deviation. CVS: RRR, S1/S2 +, no murmurs, no gallops, no carotid bruit.  Pulmonary: Effort and breath sounds normal, no rhonchi, wheezes, rales.  Abdominal: Soft. BS +,  no distension, tenderness, rebound or guarding.  Musculoskeletal: . No edema and no tenderness.  Neuro: Awake, oriented to self and place but not time Moves upper and lower extremities on verbal commands Skin: Skin is warm and dry. No rash noted.   LABORATORY PANEL:   CBC Recent Labs  Lab 05/29/18 0305  WBC 12.1*  HGB 10.5*  HCT 31.9*  PLT 434*    ------------------------------------------------------------------------------------------------------------------  Chemistries  Recent Labs  Lab 05/26/18 0558  05/29/18 0305  NA 138  --  136  K 3.6  --  3.2*  CL 106  --  103  CO2 26  --  28  GLUCOSE 171*  --  127*  BUN 13  --  10  CREATININE 0.55   < > 0.50  CALCIUM 8.0*  --  8.1*  AST 70*  --   --   ALT 25  --   --   ALKPHOS 63  --   --   BILITOT 0.8  --   --    < > = values in this interval not displayed.   ------------------------------------------------------------------------------------------------------------------  Cardiac Enzymes No results for input(s): TROPONINI in the last 168 hours. ------------------------------------------------------------------------------------------------------------------  RADIOLOGY:  Dg Chest 1 View  Result Date: 05/27/2018 CLINICAL DATA:  Status post nasogastric tube placement. EXAM: CHEST  1 VIEW COMPARISON:  Radiograph of May 27, 2018. FINDINGS: Stable cardiomediastinal silhouette. Interval placement of nasogastric tube with distal tip in expected position of proximal stomach. Stable bibasilar atelectasis or infiltrates are noted, left greater than right. No pneumothorax is noted. Bony thorax is unremarkable. IMPRESSION: Interval placement of nasogastric tube with distal tip in expected position of proximal stomach. Stable bibasilar opacities as described above. Electronically Signed   By: Lupita RaiderJames  Green Jr, M.D.   On: 05/27/2018 18:10   Dg Chest 1 View  Result Date: 05/27/2018 CLINICAL DATA:  Nasogastric tube placement. EXAM: CHEST  1 VIEW COMPARISON:  Radiograph of May 26, 2018. FINDINGS: Stable cardiomegaly. No pneumothorax is noted. Stable left upper and lower lobe opacities are noted concerning for pneumonia or atelectasis with small associated  pleural effusion. No nasogastric tube is noted on this study. Minimal right basilar subsegmental atelectasis is noted. Bony thorax  is unremarkable. IMPRESSION: Stable left lung opacities are noted concerning for pneumonia or possibly atelectasis with associated pleural effusion. No nasogastric tube is noted. Electronically Signed   By: Lupita RaiderJames  Green Jr, M.D.   On: 05/27/2018 16:14     ASSESSMENT AND PLAN:   62 year old female with a history of diabetes who presents to the emergency room due to labored breathing.  1.  S/p  hypoxic respiratory failure secondary to pneumonia Patient extubated 12/24 and weaned from BiPAP to nasal cannula and currently on oxygen via nasal cannula Currently in the medical floor Treated for pneumonia with antibiotics  2.  Status post septic shock Off IV pressors and IV fluids Blood pressure has been stable  3.  Dysphagia Failed swallow study  after speech therapy evaluated the patient Currently continue NG tube with feeds Will discuss with patient's family regarding alternate methods of feeding such as feeding tube.  Yesterday we were able to reach patient's uncle.  Today we will plan to discuss with patient's sister. Gastroenterology consultation appreciated and PEG tube placement if okay with the family  4.  UTI with E. coli sensitive to ceftriaxone Patient has been treated for UTI and completed course of antibiotics .  5.  Hyponatremia from dehydration has resolved  6.  Hypokalemia: Replace potassium via NGT  7.   diabetes: Sliding scale  8.  Transient thrombocytopenia resolved   Palliative care consulted.   Patient's overall prognosis still guarded and poor considering multiple comorbidities  CODE STATUS: FULL  TOTAL TIME TAKING CARE OF THIS PATIENT: 34 minutes.    POSSIBLE D/C IN 2 TO 3 DAYS DEPENDING ON CLINICAL CONDITION.   Ihor AustinPavan Milford Cilento M.D on 05/29/2018 at 11:13 AM  Between 7am to 6pm - Pager - [phone] After 6pm go to [email or web address] - password [email or web address]  [facility] [place] [facility]  Office  [phone]  CC: Primary care physician; Patient, No Pcp  Per  Note: This dictation was prepared with Dragon dictation along with smaller phrase technology. Any transcriptional errors that result from this process are unintentional.

## 2018-05-29 NOTE — OR Nursing (Signed)
Kaitlyn MuirJamie here to transport pt back to room via bed.
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
## 2018-05-30 ENCOUNTER — Inpatient Hospital Stay: Payer: Medicare Other

## 2018-05-30 ENCOUNTER — Encounter: Payer: Self-pay | Admitting: Gastroenterology

## 2018-05-30 DIAGNOSIS — Z931 Gastrostomy status: Secondary | ICD-10-CM

## 2018-05-30 LAB — MAGNESIUM: Magnesium: 1.7 mg/dL (ref 1.7–2.4)

## 2018-05-30 LAB — GLUCOSE, CAPILLARY
Glucose-Capillary: 116 mg/dL — ABNORMAL HIGH (ref 70–99)
Glucose-Capillary: 119 mg/dL — ABNORMAL HIGH (ref 70–99)
Glucose-Capillary: 120 mg/dL — ABNORMAL HIGH (ref 70–99)
Glucose-Capillary: 171 mg/dL — ABNORMAL HIGH (ref 70–99)
Glucose-Capillary: 177 mg/dL — ABNORMAL HIGH (ref 70–99)
Glucose-Capillary: 183 mg/dL — ABNORMAL HIGH (ref 70–99)

## 2018-05-30 LAB — POTASSIUM: Potassium: 3.6 mmol/L (ref 3.5–5.1)

## 2018-05-30 MED ORDER — POTASSIUM CHLORIDE CRYS ER 20 MEQ PO TBCR
40.0000 meq | EXTENDED_RELEASE_TABLET | Freq: Once | ORAL | Status: AC
  Administered 2018-05-30: 15:00:00 40 meq via ORAL
  Filled 2018-05-30: qty 2

## 2018-05-30 MED ORDER — OSMOLITE 1.5 CAL PO LIQD
237.0000 mL | Freq: Every day | ORAL | Status: DC
  Administered 2018-05-30 – 2018-06-01 (×11): 237 mL

## 2018-05-30 MED ORDER — FREE WATER
180.0000 mL | Freq: Every day | Status: DC
  Administered 2018-05-30 – 2018-06-01 (×10): 180 mL

## 2018-05-30 MED ORDER — MAGNESIUM SULFATE IN D5W 1-5 GM/100ML-% IV SOLN
1.0000 g | Freq: Once | INTRAVENOUS | Status: AC
  Administered 2018-05-30: 1 g via INTRAVENOUS
  Filled 2018-05-30: qty 100
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
## 2018-05-30 NOTE — Progress Notes (Signed)
SLP Cancellation Note  Patient Details Name: Kaitlyn Ruiz MRN: [id number] DOB: September 15, 1955   Cancelled treatment:       Reason Eval/Treat Not Completed: Other (comment); Patient alert however continued intermittent confusion, distractibility, and impulsivity. Patient with baseline audible congestion with intermittent coughing episodes prior to PO intake of any kind. D/t above factors, tx session withheld at this time. Will f/u at a later time.   Kaitlyn Ruiz, M.S. CCC-SLP Speech-Language Pathologist   Kaitlyn Ruiz 05/30/2018, 4:02 PM

## 2018-05-30 NOTE — Progress Notes (Signed)
Melodie BouillonVarnita Avelynn Sellin, MD [address], [address], [place], [place], [id number] [address], [address], [place], [place], [address] Phone: [phone]  Fax: [phone]   Subjective: Patient resting in bed comfortably today.  PEG tube being used for feeds without problems.   Objective: Exam: Vital signs in last 24 hours: Vitals:   05/29/18 1304 05/29/18 1916 05/29/18 2112 05/30/18 0443  BP: 116/87 (!) 119/56  (!) 122/54  Pulse: 96 79  88  Resp: (!) 21     Temp:  98.5 F (36.9 C)  98.9 F (37.2 C)  TempSrc:  Oral  Axillary  SpO2: 98% 100% 100% 99%  Weight:      Height:       Weight change:   Intake/Output Summary (Last 24 hours) at 05/30/2018 1238 Last data filed at 05/30/2018 1007 Gross per 24 hour  Intake 200 ml  Output 1650 ml  Net -1450 ml    General: No acute distress, AAO x3 Abd: Soft, NT/ND, No HSM PEG tube site appears clean and healthy, with no bleeding seen.  PEG tube has to external bumper is in place.  The external bumper closest to the skin ends at 5-1/2 cm mark, and is lightly touching the skin but not causing external compression (it is about 1cm from abdominal wall skin).  She PEG tube able to be rotated without difficulty or pain at the site. Skin: Warm, no rashes Neck: Supple, Trachea midline   Lab Results: Lab Results  Component Value Date   WBC 12.1 (H) 05/29/2018   HGB 10.5 (L) 05/29/2018   HCT 31.9 (L) 05/29/2018   MCV 94.4 05/29/2018   PLT 434 (H) 05/29/2018   Micro Results: No results found for this or any previous visit (from the past 240 hour(s)). Studies/Results: Dg Chest Port 1 View  Result Date: 05/30/2018 CLINICAL DATA:  Routine, pneumonia. EXAM: PORTABLE CHEST 1 VIEW COMPARISON:  05/27/2018. FINDINGS: Decreased lung volumes. BILATERAL pulmonary opacities are redemonstrated, slightly improved on the LEFT, slightly worse on the RIGHT, which could represent pneumonia or atelectasis with effusions. Stable cardiomediastinal  silhouette. Osteopenia. IMPRESSION: Slight improvement aeration. See discussion above. Electronically Signed   By: Elsie StainJohn T Curnes M.D.   On: 05/30/2018 10:13   Medications:  Scheduled Meds: . aspirin  81 mg Per Tube Daily  . docusate  100 mg Per Tube BID  . enoxaparin (LOVENOX) injection  40 mg Subcutaneous Q24H  . feeding supplement (OSMOLITE 1.5 CAL)  237 mL Per Tube 5 X Daily  . feeding supplement (PRO-STAT SUGAR FREE 64)  30 mL Per Tube BID  . free water  180 mL Per Tube 5 X Daily  . gabapentin  300 mg Per Tube Q8H  . insulin aspart  0-15 Units Subcutaneous Q4H  . ipratropium-albuterol  3 mL Nebulization Q6H  . lisinopril  2.5 mg Per Tube Daily  . mouth rinse  15 mL Mouth Rinse q12n4p  . potassium chloride  40 mEq Oral Once  . pravastatin  20 mg Per Tube q1800  . valproic acid  125 mg Per Tube BID   Continuous Infusions: . sodium chloride 20 mL (05/29/18 1149)  . magnesium sulfate 1 - 4 g bolus IVPB     PRN Meds:.sodium chloride, acetaminophen **OR** acetaminophen, bisacodyl, magnesium hydroxide, ondansetron (ZOFRAN) IV, polyethylene glycol, polyvinyl alcohol   Assessment: Active Problems:   Acute respiratory failure with hypoxemia (HCC)   Endotracheal tube present   Palliative care encounter   Neurological dysfunction   Oropharyngeal dysphagia  Esophageal dysphagia    Plan: PEG tube checked and appears healthy and is functioning well Change dressing above PEG tube bumper daily. Continue daily PEG tube care with cleaning the PEG tube site with soap and water once daily and antibiotic ointment once daily to the site. Do not place gauze underneath the PEG tube bumper  Okay to place and change dressing above the PEG tube   LOS: 16 days   Melodie BouillonVarnita Kinslee Dalpe, MD 05/30/2018, 12:38 PM

## 2018-05-30 NOTE — Care Management (Signed)
Barrier- Peg tube placement. Initiate tube feeds

## 2018-05-30 NOTE — Progress Notes (Addendum)
[facility] [place] [facility]   PATIENT NAME: Kaitlyn Ruiz    MR#:  [id number]  DATE OF BIRTH:  July 22, 1955  SUBJECTIVE:  Patient seen and evaluated today On oxygen via nasal cannula Of NG tube Status post PEG tube placement by gastroenterology   REVIEW OF SYSTEMS:   Review of Systems  Constitutional: Negative.   HENT: Negative.   Eyes: Negative.   Respiratory: Negative.   Cardiovascular: Negative.   Gastrointestinal: Negative.   Genitourinary: Negative.   Musculoskeletal: Negative.   Skin: Negative.   Neurological: Responds to verbal commands Moves upper and lower extremities Slow to respond but awake and verbalizes Endo/Heme/Allergies: Negative.   Psychiatric/Behavioral: Negative.   All other systems reviewed and are negative.  DRUG ALLERGIES:  No Known Allergies  VITALS:  Blood pressure (!) 122/54, pulse 88, temperature 98.9 F (37.2 C), temperature source Axillary, resp. rate (!) 21, height 5\' 6"  (1.676 m), weight 93.5 kg, SpO2 99 %.  PHYSICAL EXAMINATION:  Constitutional: Appears well-developed and well-nourished. HEENT: Normocephalic. Marland Kitchen. eyes: Conjunctivae  normal. no scleral icterus.  NG tube noted Neck: Normal ROM. Neck supple. No JVD. No tracheal deviation. CVS: RRR, S1/S2 +, no murmurs, no gallops, no carotid bruit.  Pulmonary: Effort and breath sounds normal, no rhonchi, wheezes, rales.  Abdominal: Soft. BS +,  no distension, tenderness, rebound or guarding.  Musculoskeletal: . No edema and no tenderness.  Neuro: Awake, oriented to self and place but not time Moves upper and lower extremities on verbal commands Skin: Skin is warm and dry. No rash noted.   LABORATORY PANEL:   CBC Recent Labs  Lab 05/29/18 0305  WBC 12.1*  HGB 10.5*  HCT 31.9*  PLT 434*   ------------------------------------------------------------------------------------------------------------------  Chemistries  Recent Labs  Lab 05/26/18 0558   05/29/18 0305 05/30/18 0341  NA 138  --  136  --   K 3.6  --  3.2* 3.6  CL 106  --  103  --   CO2 26  --  28  --   GLUCOSE 171*  --  127*  --   BUN 13  --  10  --   CREATININE 0.55   < > 0.50  --   CALCIUM 8.0*  --  8.1*  --   MG  --   --   --  1.7  AST 70*  --   --   --   ALT 25  --   --   --   ALKPHOS 63  --   --   --   BILITOT 0.8  --   --   --    < > = values in this interval not displayed.   ------------------------------------------------------------------------------------------------------------------  Cardiac Enzymes No results for input(s): TROPONINI in the last 168 hours. ------------------------------------------------------------------------------------------------------------------  RADIOLOGY:  Dg Chest Port 1 View  Result Date: 05/30/2018 CLINICAL DATA:  Routine, pneumonia. EXAM: PORTABLE CHEST 1 VIEW COMPARISON:  05/27/2018. FINDINGS: Decreased lung volumes. BILATERAL pulmonary opacities are redemonstrated, slightly improved on the LEFT, slightly worse on the RIGHT, which could represent pneumonia or atelectasis with effusions. Stable cardiomediastinal silhouette. Osteopenia. IMPRESSION: Slight improvement aeration. See discussion above. Electronically Signed   By: Elsie StainJohn T Curnes M.D.   On: 05/30/2018 10:13     ASSESSMENT AND PLAN:   62 year old female with a history of diabetes who presents to the emergency room due to labored breathing.  1.  S/p  hypoxic respiratory failure secondary to pneumonia Patient extubated 12/24 and weaned from  BiPAP to nasal cannula and currently on oxygen via nasal cannula Currently in the medical floor Treated for pneumonia with antibiotics  2.  Status post septic shock Off IV pressors and IV fluids Blood pressure has been stable  3.  Dysphagia Failed swallow study   PEG tube placed by gastroenterology Start PEG tube feeding today  4.  Hypomagnesemia Replace magnesium intravenously  5.  UTI with E. coli sensitive to  ceftriaxone Patient has been treated for UTI and completed course of antibiotics .  6.  Hyponatremia from dehydration has resolved  7.  Hypokalemia: Replace potassium PEG tube  8.   diabetes: Sliding scale  9.  Transient thrombocytopenia resolved  10.  Disposition Return to long-term care when medically stable   Palliative care consulted.   Patient's overall prognosis still guarded and poor considering multiple comorbidities  CODE STATUS: FULL  TOTAL TIME TAKING CARE OF THIS PATIENT: 35 minutes.    POSSIBLE D/C IN 2 TO 3 DAYS DEPENDING ON CLINICAL CONDITION.   Ihor AustinPavan Geovany Trudo M.D on 05/30/2018 at 12:26 PM  Between 7am to 6pm - Pager - [phone] After 6pm go to [email or web address] - password [email or web address]  [facility]  Office  [phone]  CC: Primary care physician; Patient, No Pcp Per  Note: This dictation was prepared with Dragon dictation along with smaller phrase technology. Any transcriptional errors that result from this process are unintentional.

## 2018-05-31 ENCOUNTER — Inpatient Hospital Stay: Payer: Medicare Other

## 2018-05-31 ENCOUNTER — Encounter: Payer: Self-pay | Admitting: Radiology

## 2018-05-31 LAB — BASIC METABOLIC PANEL
Anion gap: 5 (ref 5–15)
BUN: 13 mg/dL (ref 6–20)
CO2: 27 mmol/L (ref 22–32)
Calcium: 8.1 mg/dL — ABNORMAL LOW (ref 8.9–10.3)
Chloride: 103 mmol/L (ref 98–111)
Creatinine, Ser: 0.59 mg/dL (ref 0.44–1.00)
GFR calc Af Amer: >60 mL/min (ref >60)
GFR calc non Af Amer: >60 mL/min (ref >60)
Glucose, Bld: 148 mg/dL — ABNORMAL HIGH (ref 70–99)
Potassium: 3.7 mmol/L (ref 3.5–5.1)
Sodium: 135 mmol/L (ref 135–145)

## 2018-05-31 LAB — CBC
HCT: 31.6 % — ABNORMAL LOW (ref 36.0–46.0)
HEMOGLOBIN: 10.3 g/dL — AB (ref 12.0–15.0)
MCH: 31.4 pg (ref 26.0–34.0)
MCHC: 32.6 g/dL (ref 30.0–36.0)
MCV: 96.3 fL (ref 80.0–100.0)
Platelets: 360 10*3/uL (ref 150–400)
RBC: 3.28 MIL/uL — ABNORMAL LOW (ref 3.87–5.11)
RDW: 13.6 % (ref 11.5–15.5)
WBC: 10.2 10*3/uL (ref 4.0–10.5)
nRBC: 0 % (ref 0.0–0.2)

## 2018-05-31 LAB — GLUCOSE, CAPILLARY
Glucose-Capillary: 145 mg/dL — ABNORMAL HIGH (ref 70–99)
Glucose-Capillary: 161 mg/dL — ABNORMAL HIGH (ref 70–99)
Glucose-Capillary: 165 mg/dL — ABNORMAL HIGH (ref 70–99)
Glucose-Capillary: 173 mg/dL — ABNORMAL HIGH (ref 70–99)
Glucose-Capillary: 207 mg/dL — ABNORMAL HIGH (ref 70–99)
Glucose-Capillary: 207 mg/dL — ABNORMAL HIGH (ref 70–99)

## 2018-05-31 LAB — MAGNESIUM: Magnesium: 1.7 mg/dL (ref 1.7–2.4)

## 2018-05-31 LAB — PROCALCITONIN: Procalcitonin: <0.1 ng/mL

## 2018-05-31 MED ORDER — IPRATROPIUM-ALBUTEROL 0.5-2.5 (3) MG/3ML IN SOLN
3.0000 mL | RESPIRATORY_TRACT | Status: DC
  Administered 2018-05-31 – 2018-06-01 (×5): 3 mL via RESPIRATORY_TRACT
  Filled 2018-05-31 (×5): qty 3

## 2018-05-31 MED ORDER — SCOPOLAMINE 1 MG/3DAYS TD PT72
1.0000 | MEDICATED_PATCH | TRANSDERMAL | Status: DC
  Administered 2018-05-31: 1.5 mg via TRANSDERMAL
  Filled 2018-05-31 (×2): qty 1

## 2018-05-31 MED ORDER — BUDESONIDE 0.25 MG/2ML IN SUSP
0.2500 mg | Freq: Two times a day (BID) | RESPIRATORY_TRACT | Status: DC
  Administered 2018-05-31 – 2018-06-01 (×3): 0.25 mg via RESPIRATORY_TRACT
  Filled 2018-05-31 (×3): qty 2

## 2018-05-31 MED ORDER — ACETYLCYSTEINE 20 % IN SOLN
4.0000 mL | Freq: Two times a day (BID) | RESPIRATORY_TRACT | Status: DC
  Administered 2018-05-31: 15:00:00 4 mL via RESPIRATORY_TRACT
  Filled 2018-05-31: qty 4

## 2018-05-31 MED ORDER — IOHEXOL 300 MG/ML  SOLN
75.0000 mL | Freq: Once | INTRAMUSCULAR | Status: AC | PRN
  Administered 2018-05-31: 75 mL via INTRAVENOUS

## 2018-05-31 NOTE — Progress Notes (Signed)
[facility] [place] at [facility]   PATIENT NAME: Kaitlyn Ruiz    MR#:  [id number]  DATE OF BIRTH:  1955/06/11  SUBJECTIVE:  Patient complains of still having breathing trouble  REVIEW OF SYSTEMS:   Review of Systems  Constitutional: Negative.   HENT: Negative.   Eyes: Negative.   Respiratory: Positive shortness of breath positive cough Cardiovascular: Negative.   Gastrointestinal: Negative.   Genitourinary: Negative.   Musculoskeletal: Negative.   Skin: Negative.   Neurological: Responds to verbal commands Moves upper and lower extremities Slow to respond but awake and verbalizes Endo/Heme/Allergies: Negative.   Psychiatric/Behavioral: Negative.   All other systems reviewed and are negative.  DRUG ALLERGIES:  No Known Allergies  VITALS:  Blood pressure 105/65, pulse 90, temperature 98.9 F (37.2 C), temperature source Oral, resp. rate 20, height 5\' 6"  (1.676 m), weight 93.5 kg, SpO2 97 %.  PHYSICAL EXAMINATION:  Constitutional: Appears well-developed and well-nourished. HEENT: Normocephalic. Marland Kitchen eyes: Conjunctivae  normal. no scleral icterus.  NG tube noted Neck: Normal ROM. Neck supple. No JVD. No tracheal deviation. CVS: RRR, S1/S2 +, no murmurs, no gallops, no carotid bruit.  Pulmonary: Rhonchus breath sounds bilaterally.  Abdominal: Soft. BS +,  no distension, tenderness, rebound or guarding.  Musculoskeletal: . No edema and no tenderness.  Neuro: Awake, oriented to self and place but not time Moves upper and lower extremities on verbal commands Skin: Skin is warm and dry. No rash noted.   LABORATORY PANEL:   CBC Recent Labs  Lab 05/31/18 0423  WBC 10.2  HGB 10.3*  HCT 31.6*  PLT 360   ------------------------------------------------------------------------------------------------------------------  Chemistries  Recent Labs  Lab 05/26/18 0558  05/31/18 0423  NA 138   < > 135  K 3.6   < > 3.7  CL 106   < > 103  CO2 26   < > 27   GLUCOSE 171*   < > 148*  BUN 13   < > 13  CREATININE 0.55   < > 0.59  CALCIUM 8.0*   < > 8.1*  MG  --    < > 1.7  AST 70*  --   --   ALT 25  --   --   ALKPHOS 63  --   --   BILITOT 0.8  --   --    < > = values in this interval not displayed.   ------------------------------------------------------------------------------------------------------------------  Cardiac Enzymes No results for input(s): TROPONINI in the last 168 hours. ------------------------------------------------------------------------------------------------------------------  RADIOLOGY:  Dg Chest Port 1 View  Result Date: 05/30/2018 CLINICAL DATA:  Routine, pneumonia. EXAM: PORTABLE CHEST 1 VIEW COMPARISON:  05/27/2018. FINDINGS: Decreased lung volumes. BILATERAL pulmonary opacities are redemonstrated, slightly improved on the LEFT, slightly worse on the RIGHT, which could represent pneumonia or atelectasis with effusions. Stable cardiomediastinal silhouette. Osteopenia. IMPRESSION: Slight improvement aeration. See discussion above. Electronically Signed   By: Elsie Stain M.D.   On: 05/30/2018 10:13     ASSESSMENT AND PLAN:   63 year old female with a history of diabetes who presents to the emergency room due to labored breathing.  1.  S/p  hypoxic respiratory failure secondary to pneumonia Patient extubated 12/24 and weaned from BiPAP to nasal cannula and currently on oxygen via nasal cannula We will start patient on scopolamine due to persistent secretion Still continues to have respiratory difficulties I will order a CT scan of the chest to further evaluate  2.  Status post septic shock Off IV pressors  and IV fluids Blood pressure has been stable  3.  Dysphagia Failed swallow study   PEG tube placed by gastroenterology Start PEG tube feeding today  4.  Hypomagnesemia Replace magnesium intravenously  5.  UTI with E. coli sensitive to ceftriaxone Patient has been treated for UTI and completed  course of antibiotics .  6.  Hyponatremia from dehydration has resolved  7.  Hypokalemia: Replace potassium PEG tube  8.   diabetes: Sliding scale  9.  Transient thrombocytopenia resolved  10.  Previous history of stroke   Disposition Return to long-term care when medically stable   Palliative care consulted.   Patient's overall prognosis still guarded and poor considering multiple comorbidities  CODE STATUS: FULL  TOTAL TIME TAKING CARE OF THIS PATIENT: 35 minutes.    POSSIBLE D/C IN 2 TO 3 DAYS DEPENDING ON CLINICAL CONDITION.   Auburn Bilberry M.D on 05/31/2018 at 2:03 PM  Between 7am to 6pm - Pager - [phone] After 6pm go to [email or web address] - password [email or web address]  [facility]  Office  [phone]  CC: Primary care physician; Patient, No Pcp Per  Note: This dictation was prepared with Dragon dictation along with smaller phrase technology. Any transcriptional errors that result from this process are unintentional.

## 2018-05-31 NOTE — Progress Notes (Signed)
Per [facility] admissions coordinator at [facility] patient is a long term care SNF resident at [facility] and can return to the facility when medically stable. Tresa Endo is aware patient had PEG placed.   [employer], LCSW [phone]

## 2018-06-01 LAB — GLUCOSE, CAPILLARY
Glucose-Capillary: 133 mg/dL — ABNORMAL HIGH (ref 70–99)
Glucose-Capillary: 152 mg/dL — ABNORMAL HIGH (ref 70–99)
Glucose-Capillary: 192 mg/dL — ABNORMAL HIGH (ref 70–99)
Glucose-Capillary: 234 mg/dL — ABNORMAL HIGH (ref 70–99)

## 2018-06-01 MED ORDER — FREE WATER: 180.0000 mL | Freq: Every day | Status: DC

## 2018-06-01 MED ORDER — IPRATROPIUM-ALBUTEROL 0.5-2.5 (3) MG/3ML IN SOLN: 3.0000 mL | RESPIRATORY_TRACT | Status: DC

## 2018-06-01 MED ORDER — LISINOPRIL 2.5 MG PO TABS: 2.5000 mg | ORAL_TABLET | Freq: Every day | ORAL | Status: DC

## 2018-06-01 MED ORDER — BUDESONIDE 0.25 MG/2ML IN SUSP: 0.2500 mg | Freq: Two times a day (BID) | RESPIRATORY_TRACT | 12 refills | Status: DC

## 2018-06-01 MED ORDER — ASPIRIN 81 MG PO CHEW: 81.0000 mg | CHEWABLE_TABLET | Freq: Every day | ORAL | Status: DC

## 2018-06-01 MED ORDER — OSMOLITE 1.5 CAL PO LIQD: 237.0000 mL | Freq: Every day | ORAL | 0 refills | Status: DC

## 2018-06-01 MED ORDER — CHOLECALCIFEROL 1.25 MG (50000 UT) PO TABS: 1.0000 | ORAL_TABLET | ORAL | Status: DC

## 2018-06-01 MED ORDER — PRO-STAT SUGAR FREE PO LIQD: 30.0000 mL | Freq: Two times a day (BID) | ORAL | 0 refills | Status: DC

## 2018-06-01 MED ORDER — PRAVASTATIN SODIUM 20 MG PO TABS: 20.0000 mg | ORAL_TABLET | Freq: Every day | ORAL | Status: DC

## 2018-06-01 MED ORDER — GABAPENTIN 250 MG/5ML PO SOLN: 300.0000 mg | Freq: Three times a day (TID) | ORAL | 12 refills | Status: AC

## 2018-06-01 MED ORDER — SCOPOLAMINE 1 MG/3DAYS TD PT72: 1.0000 | MEDICATED_PATCH | TRANSDERMAL | 12 refills | Status: DC

## 2018-06-01 MED ORDER — MAGNESIUM HYDROXIDE 400 MG/5ML PO SUSP: 30.0000 mL | Freq: Every day | ORAL | 0 refills | Status: DC | PRN

## 2018-06-01 MED ORDER — DOCUSATE SODIUM 50 MG/5ML PO LIQD: 100.0000 mg | Freq: Two times a day (BID) | ORAL | 0 refills | Status: DC

## 2018-06-01 MED ORDER — VALPROIC ACID 250 MG/5ML PO SOLN: 125.0000 mg | Freq: Two times a day (BID) | ORAL | Status: DC

## 2018-06-01 MED ORDER — ORAL CARE MOUTH RINSE: 15.0000 mL | Freq: Two times a day (BID) | OROMUCOSAL | 0 refills | Status: AC

## 2018-06-01 MED ORDER — POLYETHYLENE GLYCOL 3350 17 G PO PACK: 17.0000 g | PACK | Freq: Every day | ORAL | 0 refills | Status: DC | PRN

## 2018-06-01 MED ORDER — MULTI-VITAMINS PO TABS: 1.0000 | ORAL_TABLET | Freq: Every day | ORAL | Status: DC
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
## 2018-06-01 NOTE — Progress Notes (Addendum)
Patient is currently followed by outpatient Palliative at [facility]  CSW Ruthe Mannan made aware. Dayna Barker RN, BSN, [facility] [place], hospital Liaison [phone]

## 2018-06-01 NOTE — Care Management Important Message (Signed)
Copy of Medicare  IM left in patient's room. Unable to sign.

## 2018-06-01 NOTE — Progress Notes (Signed)
EMS called for transport to [facility]. Bo Mcclintock, RN
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
## 2018-06-01 NOTE — Clinical Social Work Note (Signed)
Patient is medically ready for discharge today back to [facility]. CSW notified patient's sister Cleopatra CedarLavoris Benbow [phone] of discharge today. CSW also notified Tresa EndoKelly at [facility] of discharge today. Patient will be transported by EMS. RN to call report and call for transport.   Ruthe Mannanandace Caryl Fate MSW, [address] RdCSWA [phone]

## 2018-06-01 NOTE — Discharge Summary (Signed)
[facility] [place] at [facility], 63 y.o., DOB 1956/03/15, MRN [id number]. Admission date: 05/14/2018 Discharge Date 06/01/2018 Primary MD Patient, No Pcp Per Admitting Physician Arnaldo Natal, MD  Admission Diagnosis  Dehydration [E86.0] Hyperosmolar (nonketotic) coma (HCC) [E11.01] Septic shock (HCC) [A41.9, R65.21] Endotracheal tube present [Z97.8]  Discharge Diagnosis   Active Problems:   Acute respiratory failure with hypoxemia (HCC) secondary to pneumonia Status post septic shock due to pneumonia Dysphasia status post PEG tube placed Hypomagnesemia UTI with E. coli status post treatment Hyponatremia Hypokalemia Diabetes Transient thrombocytopenia Previous history of CVA              Hospital Course patient 63 year old female with history of diabetes and a previous stroke who presented to the emergency room with acute respiratory failure.  Patient required BiPAP.  And ICU stay.  She was noted to have pneumonia which was treated.  She was seen by speech therapy and felt to have significant dysphasia and recommended n.p.o. by mouth.  Patient was seen by GI and had PEG tube placement.   Her respiratory status is now improved.  Overall prognosis is very poor.  Palliative care team to follow her at the facility   Continue oxygen 2 L at all time  Tube feeds as per discharge medications    Consults  pulmonary/intensive care  Significant Tests:  See full reports for all details    Dg Chest 1 View  Result Date: 05/27/2018 CLINICAL DATA:  Status post nasogastric tube placement. EXAM: CHEST  1 VIEW COMPARISON:  Radiograph of May 27, 2018. FINDINGS: Stable cardiomediastinal silhouette. Interval placement of nasogastric tube with distal tip in expected position of proximal stomach. Stable bibasilar atelectasis or infiltrates are noted, left greater than right. No pneumothorax is noted. Bony thorax is unremarkable. IMPRESSION:  Interval placement of nasogastric tube with distal tip in expected position of proximal stomach. Stable bibasilar opacities as described above. Electronically Signed   By: Lupita Raider, M.D.   On: 05/27/2018 18:10   Dg Chest 1 View  Result Date: 05/27/2018 CLINICAL DATA:  Nasogastric tube placement. EXAM: CHEST  1 VIEW COMPARISON:  Radiograph of May 26, 2018. FINDINGS: Stable cardiomegaly. No pneumothorax is noted. Stable left upper and lower lobe opacities are noted concerning for pneumonia or atelectasis with small associated pleural effusion. No nasogastric tube is noted on this study. Minimal right basilar subsegmental atelectasis is noted. Bony thorax is unremarkable. IMPRESSION: Stable left lung opacities are noted concerning for pneumonia or possibly atelectasis with associated pleural effusion. No nasogastric tube is noted. Electronically Signed   By: Lupita Raider, M.D.   On: 05/27/2018 16:14   Dg Abd 1 View  Result Date: 05/25/2018 CLINICAL DATA:  Nasogastric tube placement EXAM: ABDOMEN - 1 VIEW COMPARISON:  Portable exam 1512 hours compared to 05/25/2018 at 1239 hours FINDINGS: Tip of nasogastric tube projects over the proximal stomach. Bowel gas pattern normal. LEFT basilar infiltrate identified. Bones demineralized. IMPRESSION: Nasogastric tube projects over the proximal stomach. LEFT basilar infiltrate. Electronically Signed   By: Ulyses Southward M.D.   On: 05/25/2018 15:21   Dg Abd 1 View  Result Date: 05/25/2018 CLINICAL DATA:  63 year old female status post NG tube placement. EXAM: ABDOMEN - 1 VIEW COMPARISON:  05/18/2018. portable chest radiograph 05/23/2018. FINDINGS: Portable AP upright view at 1239 hours. Enteric tube courses to the left upper quadrant and side hole is at the level of the gastric body. Confluent left lung base  opacity. Normal cardiac size and mediastinal contours. More normal appearing right lung base. Visualized bowel gas pattern is non obstructed.  IMPRESSION: 1. Enteric tube courses to the left upper quadrant and side hole at the level of the gastric body. 2. Progressed left lung base atelectasis and/or consolidation. Electronically Signed   By: Odessa FlemingH  Hall M.D.   On: 05/25/2018 12:50   Dg Abd 1 View  Result Date: 05/18/2018 CLINICAL DATA:  Status post gastric catheter placement EXAM: ABDOMEN - 1 VIEW COMPARISON:  04/13/2017 FINDINGS: Scattered large and small bowel gas is noted. Gastric catheter is noted within the gastric fundus. No bony abnormality is seen. IMPRESSION: Gastric catheter within the stomach. Electronically Signed   By: Alcide CleverMark  Lukens M.D.   On: 05/18/2018 09:32   Ct Head Wo Contrast  Result Date: 05/14/2018 CLINICAL DATA:  Altered level of consciousness. EXAM: CT HEAD WITHOUT CONTRAST TECHNIQUE: Contiguous axial images were obtained from the base of the skull through the vertex without intravenous contrast. COMPARISON:  None. FINDINGS: Brain: Generalized atrophy, advanced for age. Moderate to advanced chronic small vessel ischemia. Tiny remote lacunar infarcts in the bilateral basal ganglia. No evidence of cerebral edema, gray-white differentiation is preserved. No intracranial hemorrhage, mass effect, or midline shift. No hydrocephalus. The basilar cisterns are patent. No evidence of territorial infarct or acute ischemia. No extra-axial or intracranial fluid collection. Vascular: No hyperdense vessel. Skull: No fracture or focal lesion. Sinuses/Orbits: Minimal mucosal thickening of the ethmoid air cells may be related to intubation. No sinus fluid level. Mastoid air cells are clear. Visualized orbits are unremarkable. Other: None. IMPRESSION: 1. No acute intracranial abnormality. 2. Generalized atrophy and chronic small vessel ischemia, advanced for age. Electronically Signed   By: Narda RutherfordMelanie  Sanford M.D.   On: 05/14/2018 05:19   Ct Chest W Contrast  Result Date: 05/31/2018 CLINICAL DATA:  Inpatient. Hypoxic respiratory failure due to  pneumonia. Extubated 05/23/2018. EXAM: CT CHEST WITH CONTRAST TECHNIQUE: Multidetector CT imaging of the chest was performed during intravenous contrast administration. CONTRAST:  75mL OMNIPAQUE IOHEXOL 300 MG/ML  SOLN COMPARISON:  Chest radiograph from one day prior. FINDINGS: Motion degraded scan, limiting assessment. Scan also limited by streak artifact from the patient's upper extremities. Cardiovascular: Top-normal heart size. No significant pericardial effusion/thickening. Atherosclerotic nonaneurysmal thoracic aorta. Normal caliber pulmonary arteries. No central pulmonary emboli. Mediastinum/Nodes: No discrete thyroid nodules. Unremarkable esophagus. No pathologically enlarged axillary, mediastinal or hilar lymph nodes. Lungs/Pleura: No pneumothorax. Small dependent bilateral pleural effusions, left greater than right. Dense consolidation with volume loss in the dependent left lower lobe, favor atelectasis. Mild compressive atelectasis in the dependent basilar right lower lobe. Mild patchy consolidation and ground-glass opacity in the apical left upper lobe and central right lower lobe. No lung masses or significant discrete pulmonary nodules. Upper abdomen: No acute abnormality. Musculoskeletal: No aggressive appearing focal osseous lesions. Mild thoracic spondylosis. IMPRESSION: 1. Limited motion degraded scan. 2. Small dependent bilateral pleural effusions, left greater than right. Consolidation with volume loss in the dependent basilar lower lobes, left greater than right, favor compressive atelectasis. 3. Mild patchy consolidation and ground-glass opacity in the apical left upper lobe and central right lower lobe, compatible with mild multilobar pneumonia. Suggest follow-up post treatment chest CT in 3 months to document clearance, particularly given the limitations of this scan. Aortic Atherosclerosis (ICD10-I70.0). Electronically Signed   By: Delbert PhenixJason A Poff M.D.   On: 05/31/2018 18:32   Dg Chest Port 1  View  Result Date: 05/30/2018 CLINICAL DATA:  Routine, pneumonia.  EXAM: PORTABLE CHEST 1 VIEW COMPARISON:  05/27/2018. FINDINGS: Decreased lung volumes. BILATERAL pulmonary opacities are redemonstrated, slightly improved on the LEFT, slightly worse on the RIGHT, which could represent pneumonia or atelectasis with effusions. Stable cardiomediastinal silhouette. Osteopenia. IMPRESSION: Slight improvement aeration. See discussion above. Electronically Signed   By: Elsie Stain M.D.   On: 05/30/2018 10:13   Dg Chest Port 1 View  Result Date: 05/26/2018 CLINICAL DATA:  Respiratory failure EXAM: PORTABLE CHEST 1 VIEW COMPARISON:  05/23/2018 FINDINGS: New multifocal patchy opacities in the left upper lobe/lingula, suspicious for pneumonia. Mild bibasilar opacities, possibly atelectasis. No frank interstitial edema. Suspected small left pleural effusion. No pneumothorax. The heart is normal in size. Enteric tube courses into the stomach. IMPRESSION: Multifocal patchy opacities in the left upper lobe/lingula, new, suspicious for pneumonia Suspected small left pleural effusion. Electronically Signed   By: Charline Bills M.D.   On: 05/26/2018 05:47   Dg Chest Port 1 View  Result Date: 05/23/2018 CLINICAL DATA:  Respiratory failure. EXAM: PORTABLE CHEST 1 VIEW COMPARISON:  05/20/2018. FINDINGS: Stable cardiomediastinal silhouette. Unchanged retrocardiac density, representing atelectasis, possible infiltrate. Low lung volumes. Bibasilar scarring. Stable support tubes and lines. ET tube remains in good position 3.8 cm above. IMPRESSION: Stable chest. Electronically Signed   By: Elsie Stain M.D.   On: 05/23/2018 07:00   Dg Chest Port 1 View  Result Date: 05/20/2018 CLINICAL DATA:  Acute respiratory failure. EXAM: PORTABLE CHEST 1 VIEW COMPARISON:  05/18/2018 FINDINGS: Endotracheal tube and nasogastric tube unchanged. Lungs are somewhat hypoinflated with stable subtle bibasilar density likely atelectasis  with small amount left pleural fluid. Cardiomediastinal silhouette and remainder of the exam is unchanged. IMPRESSION: Stable subtle bibasilar density likely atelectasis with small amount left pleural fluid. Tubes and lines unchanged. Electronically Signed   By: Elberta Fortis M.D.   On: 05/20/2018 08:07   Dg Chest Port 1 View  Result Date: 05/18/2018 CLINICAL DATA:  Respiratory failure, history of diabetes and hypertension EXAM: PORTABLE CHEST 1 VIEW COMPARISON:  Portable chest x-ray of 05/14/2018 FINDINGS: The tip of the endotracheal tube is approximately 3.6 cm above the carina. Bibasilar opacities remain some of which is due to atelectasis and small left effusion, but pneumonia would be difficult to exclude particularly at the medial left lung base. Mediastinal and hilar contours are unremarkable and the heart is unchanged in size. NG tube extends into the fundus of the stomach. IMPRESSION: 1. Tip of endotracheal tube 3.6 cm above the carina. 2. Bibasilar opacities consistent with atelectasis. However, left basilar pneumonia and small left effusion cannot be excluded. Electronically Signed   By: Dwyane Dee M.D.   On: 05/18/2018 10:05   Portable Chest X-ray  Result Date: 05/14/2018 CLINICAL DATA:  Post intubation. EXAM: PORTABLE CHEST 1 VIEW COMPARISON:  Radiograph 04/13/2017 FINDINGS: Endotracheal tube tip 3.1 cm from the carina. Enteric tube tip and side-port below the diaphragm in the stomach. Lung volumes are low. Hazy opacity at the right lung base may be atelectasis or pleural effusion. Upper normal heart size with normal mediastinal contours. Streaky left lung base atelectasis. No pneumothorax. No acute osseous abnormality. IMPRESSION: 1. Endotracheal tube tip 3.1 cm from the carina. Enteric tube tip and side-port below the diaphragm in the stomach. 2. Low lung volumes. Hazy opacity at the right lung base may be atelectasis or pleural effusion. Mild left lung base atelectasis. Electronically  Signed   By: Narda Rutherford M.D.   On: 05/14/2018 04:44  Today   Subjective:   Kaitlyn Ruiz   patient denies any complaints  Objective:   Blood pressure (!) 101/52, pulse 74, temperature 98 F (36.7 C), resp. rate 17, height 5\' 6"  (1.676 m), weight 93.5 kg, SpO2 100 %.  .  Intake/Output Summary (Last 24 hours) at 06/01/2018 1336 Last data filed at 06/01/2018 0702 Gross per 24 hour  Intake 237 ml  Output 1850 ml  Net -1613 ml    Exam VITAL SIGNS: Blood pressure (!) 101/52, pulse 74, temperature 98 F (36.7 C), resp. rate 17, height 5\' 6"  (1.676 m), weight 93.5 kg, SpO2 100 %.  GENERAL:  63 y.o.-year-old patient lying in the bed with no acute distress.  EYES: Pupils equal, round, reactive to light and accommodation. No scleral icterus. Extraocular muscles intact.  HEENT: Head atraumatic, normocephalic. Oropharynx and nasopharynx clear.  NECK:  Supple, no jugular venous distention. No thyroid enlargement, no tenderness.  LUNGS: Decreased creased breath sound  cARDIOVASCULAR: S1, S2 normal. No murmurs, rubs, or gallops.  ABDOMEN: Soft, nontender, nondistended. Bowel sounds present. No organomegaly or mass.  EXTREMITIES: No pedal edema, cyanosis, or clubbing.  NEUROLOGIC: Limited PSYCHIATRIC: Awake but not oriented SKIN: No obvious rash, lesion, or ulcer.   Data Review     CBC w Diff:  Lab Results  Component Value Date   WBC 10.2 05/31/2018   HGB 10.3 (L) 05/31/2018   HGB 14.0 12/26/2013   HCT 31.6 (L) 05/31/2018   HCT 42.5 12/26/2013   PLT 360 05/31/2018   PLT 216 12/26/2013   LYMPHOPCT 27 05/14/2018   BANDSPCT 12 04/10/2017   MONOPCT 7 05/14/2018   EOSPCT 0 05/14/2018   BASOPCT 0 05/14/2018   CMP:  Lab Results  Component Value Date   NA 135 05/31/2018   NA 138 12/26/2013   K 3.7 05/31/2018   K 3.4 (L) 12/26/2013   CL 103 05/31/2018   CL 106 12/26/2013   CO2 27 05/31/2018   CO2 25 12/26/2013   BUN 13 05/31/2018   BUN 15 12/26/2013   CREATININE  0.59 05/31/2018   CREATININE 1.12 12/26/2013   PROT 5.9 (L) 05/26/2018   PROT 7.5 12/26/2013   ALBUMIN 1.8 (L) 05/26/2018   ALBUMIN 3.6 12/26/2013   BILITOT 0.8 05/26/2018   BILITOT 0.5 12/26/2013   ALKPHOS 63 05/26/2018   ALKPHOS 60 12/26/2013   AST 70 (H) 05/26/2018   AST 15 12/26/2013   ALT 25 05/26/2018   ALT 20 12/26/2013  .  Micro Results No results found for this or any previous visit (from the past 240 hour(s)).      Code Status Orders  (From admission, onward)         Start     Ordered   05/14/18 0629  Full code  Continuous     05/14/18 0628        Code Status History    Date Active Date Inactive Code Status Order ID Comments User Context   04/10/2017 2216 04/14/2017 1733 Full Code [id number]  Arnaldo Natal, MD Inpatient          Contact information for after-discharge care    Destination    [facility] Preferred SNF .   Service:  Skilled Nursing Contact information: [address] [place] [address] [phone]              Discharge Medications   Allergies as of 06/01/2018   No Known Allergies     Medication List  STOP taking these medications   ammonium lactate 12 % cream Commonly known as:  AMLACTIN   bisacodyl 5 MG EC tablet Commonly known as:  DULCOLAX   divalproex 125 MG capsule Commonly known as:  DEPAKOTE SPRINKLE   docusate sodium 100 MG capsule Commonly known as:  COLACE Replaced by:  docusate 50 MG/5ML liquid   DULoxetine 20 MG capsule Commonly known as:  CYMBALTA   gabapentin 300 MG capsule Commonly known as:  NEURONTIN Replaced by:  gabapentin 250 MG/5ML solution     TAKE these medications   aspirin 81 MG chewable tablet Place 1 tablet (81 mg total) into feeding tube daily. Start taking on:  June 02, 2018 What changed:  how to take this   budesonide 0.25 MG/2ML nebulizer solution Commonly known as:  PULMICORT Take 2 mLs (0.25 mg total) by nebulization 2 (two)  times daily.   Cholecalciferol 1.25 MG (50000 UT) Tabs 1 tablet by PEG Tube route as directed. Every 60 days What changed:  how to take this   docusate 50 MG/5ML liquid Commonly known as:  COLACE Place 10 mLs (100 mg total) into feeding tube 2 (two) times daily. Replaces:  docusate sodium 100 MG capsule   feeding supplement (OSMOLITE 1.5 CAL) Liqd Place 237 mLs into feeding tube 5 (five) times daily.   feeding supplement (PRO-STAT SUGAR FREE 64) Liqd Place 30 mLs into feeding tube 2 (two) times daily.   free water Soln Place 180 mLs into feeding tube 5 (five) times daily.   gabapentin 250 MG/5ML solution Commonly known as:  NEURONTIN Place 6 mLs (300 mg total) into feeding tube every 8 (eight) hours. Replaces:  gabapentin 300 MG capsule   Hypromellose 0.4 % Soln Place 1 drop into both eyes daily.   ipratropium-albuterol 0.5-2.5 (3) MG/3ML Soln Commonly known as:  DUONEB Take 3 mLs by nebulization every 4 (four) hours. What changed:    when to take this  reasons to take this   lisinopril 2.5 MG tablet Commonly known as:  PRINIVIL,ZESTRIL Place 1 tablet (2.5 mg total) into feeding tube daily. Start taking on:  June 02, 2018 What changed:  how to take this   magnesium hydroxide 400 MG/5ML suspension Commonly known as:  MILK OF MAGNESIA Place 30 mLs into feeding tube daily as needed for mild constipation. What changed:  how to take this   mouth rinse Liqd solution 15 mLs by Mouth Rinse route 2 times daily at 12 noon and 4 pm.   MULTI-VITAMINS Tabs 1 tablet by PEG Tube route daily. What changed:  how to take this   polyethylene glycol packet Commonly known as:  MIRALAX / GLYCOLAX 17 g by Per NG tube route daily as needed (no bowel movement x 24 hours).   pravastatin 20 MG tablet Commonly known as:  PRAVACHOL Place 1 tablet (20 mg total) into feeding tube daily at 6 PM. What changed:    how to take this  when to take this   scopolamine 1  MG/3DAYS Commonly known as:  TRANSDERM-SCOP Place 1 patch (1.5 mg total) onto the skin every 3 (three) days. Start taking on:  June 03, 2018   valproic acid 250 MG/5ML Soln solution Commonly known as:  DEPAKENE Place 2.5 mLs (125 mg total) into feeding tube 2 (two) times daily.          Total Time in preparing paper work, data evaluation and todays exam - 35 minutes  Auburn BilberryShreyang Jovaun Levene M.D on 06/01/2018 at 1:36 PM  [employer]  [phone]

## 2018-06-02 NOTE — Anesthesia Postprocedure Evaluation (Signed)
Anesthesia Post Note  Patient: Kaitlyn Ruiz  Procedure(s) Performed: PERCUTANEOUS ENDOSCOPIC GASTROSTOMY (PEG) PLACEMENT (N/A )  Patient location during evaluation: Endoscopy Anesthesia Type: General Level of consciousness: awake and alert Pain management: pain level controlled Vital Signs Assessment: post-procedure vital signs reviewed and stable Respiratory status: spontaneous breathing, nonlabored ventilation, respiratory function stable and patient connected to nasal cannula oxygen Cardiovascular status: blood pressure returned to baseline and stable Postop Assessment: no apparent nausea or vomiting Anesthetic complications: no     Last Vitals:  Vitals:   06/01/18 0921 06/01/18 1158  BP:    Pulse:    Resp:    Temp:    SpO2: 99% 100%    Last Pain:  Vitals:   06/01/18 0036  TempSrc:   PainSc: 0-No pain                 Lenard Simmer

## 2018-06-05 ENCOUNTER — Non-Acute Institutional Stay: Payer: Medicare Other | Admitting: Nurse Practitioner

## 2018-06-05 ENCOUNTER — Encounter: Payer: Self-pay | Admitting: Nurse Practitioner

## 2018-06-05 VITALS — BP 109/69 | HR 69 | Temp 97.6°F | Resp 20 | Ht 67.0 in | Wt 188.6 lb

## 2018-06-05 DIAGNOSIS — R630 Anorexia: Secondary | ICD-10-CM | POA: Insufficient documentation

## 2018-06-05 DIAGNOSIS — Z515 Encounter for palliative care: Secondary | ICD-10-CM

## 2018-06-05 DIAGNOSIS — R531 Weakness: Secondary | ICD-10-CM
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
## 2018-06-08 ENCOUNTER — Inpatient Hospital Stay: Admission: RE | Admit: 2018-06-08 | Payer: Medicare Other | Source: Ambulatory Visit

## 2018-06-09 ENCOUNTER — Telehealth: Payer: Self-pay | Admitting: Nurse Practitioner

## 2018-06-09 NOTE — Telephone Encounter (Signed)
I called and left message to return call concerning PC visit

## 2018-06-09 NOTE — Telephone Encounter (Signed)
Kaitlyn Ruiz, Kaitlyn Ruiz sister Health Care power-of-attorney return call. Talk about purpose for palliative medicine visit and gave consent to continue to follow under palliative medicine. We talked about palliative medicine visit with Kaitlyn Ruiz. We talked about past medical history in the study of chronic disease progression with recent hospitalization. He talked about new feeding tube placement. We talked about the potential Kaitlyn Ruiz removing that herself although she seems to be leaving that alone. We talked about her continuing to attempt to remove her oxygen. We talked about her functional as well as her cognitive decline. We talked about symptoms. We talked about concerns Kaitlyn Ruiz has of Kaitlyn Ruiz being dehydrated. We talked about function a feeding tube, tube feedings and hydration. We talked about mobility in the importance of being out of bed. Kaitlyn Ruiz continues to decline wishes to be out of bed when staff has asked. We talked about continuing to encourage Kaitlyn Ruiz and a positive communication to increase socialization a Mobility. We talked about medical goals with continuing for wishes for aggressive interventions including full code and returning to the hospital if necessary. We talked about role of palliative care and plan of care. Discuss that will follow up in 1 month needed or sooner said she declined. Kaitlyn Ruiz in agreement. Questions answered to satisfaction. Contact information provided.  Total time spent 30 minutes Phone discussion 20 minutes Documentation 10 minutes

## 2018-08-15 ENCOUNTER — Encounter: Payer: Self-pay | Admitting: Nurse Practitioner

## 2018-08-15 ENCOUNTER — Non-Acute Institutional Stay: Payer: Medicare Other | Admitting: Nurse Practitioner

## 2018-08-15 VITALS — BP 156/91 | HR 82 | Temp 97.1°F | Resp 18 | Ht 67.0 in | Wt 181.0 lb

## 2018-08-15 DIAGNOSIS — R630 Anorexia: Secondary | ICD-10-CM

## 2018-08-15 DIAGNOSIS — Z515 Encounter for palliative care: Secondary | ICD-10-CM

## 2018-08-15 DIAGNOSIS — R531 Weakness: Secondary | ICD-10-CM
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
## 2018-08-16 ENCOUNTER — Other Ambulatory Visit: Payer: Self-pay

## 2018-10-04 ENCOUNTER — Other Ambulatory Visit: Payer: Self-pay

## 2018-10-04 ENCOUNTER — Non-Acute Institutional Stay: Payer: Medicare Other | Admitting: Nurse Practitioner

## 2018-10-04 ENCOUNTER — Encounter: Payer: Self-pay | Admitting: Nurse Practitioner

## 2018-10-04 VITALS — BP 112/74 | HR 74 | Temp 97.8°F | Resp 18 | Ht 67.0 in | Wt 180.1 lb

## 2018-10-04 DIAGNOSIS — R531 Weakness: Secondary | ICD-10-CM

## 2018-10-04 DIAGNOSIS — R630 Anorexia: Secondary | ICD-10-CM

## 2018-10-04 DIAGNOSIS — Z515 Encounter for palliative care: Secondary | ICD-10-CM
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
## 2019-01-26 ENCOUNTER — Encounter: Payer: Self-pay | Admitting: Nurse Practitioner

## 2019-01-26 ENCOUNTER — Non-Acute Institutional Stay: Payer: Medicare Other | Admitting: Nurse Practitioner

## 2019-01-26 VITALS — BP 134/77 | HR 80 | Temp 98.2°F | Resp 18 | Wt 172.0 lb

## 2019-01-26 DIAGNOSIS — Z515 Encounter for palliative care: Secondary | ICD-10-CM
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
## 2019-01-29 ENCOUNTER — Other Ambulatory Visit: Payer: Self-pay

## 2019-03-30 ENCOUNTER — Non-Acute Institutional Stay: Payer: Medicare Other | Admitting: Nurse Practitioner

## 2019-03-30 ENCOUNTER — Other Ambulatory Visit: Payer: Self-pay

## 2019-03-30 ENCOUNTER — Encounter: Payer: Self-pay | Admitting: Nurse Practitioner

## 2019-03-30 DIAGNOSIS — Z515 Encounter for palliative care: Secondary | ICD-10-CM
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
## 2019-04-13 ENCOUNTER — Encounter: Payer: Self-pay | Admitting: Nurse Practitioner

## 2019-04-13 ENCOUNTER — Non-Acute Institutional Stay: Payer: Medicare Other | Admitting: Nurse Practitioner

## 2019-04-13 ENCOUNTER — Other Ambulatory Visit: Payer: Self-pay

## 2019-04-13 VITALS — BP 132/77 | HR 82 | Temp 98.0°F | Resp 18 | Wt 172.7 lb

## 2019-04-13 DIAGNOSIS — J449 Chronic obstructive pulmonary disease, unspecified: Secondary | ICD-10-CM

## 2019-04-13 DIAGNOSIS — Z515 Encounter for palliative care: Secondary | ICD-10-CM
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
## 2019-05-26 ENCOUNTER — Emergency Department: Payer: Medicare Other

## 2019-05-26 ENCOUNTER — Inpatient Hospital Stay
Admission: EM | Admit: 2019-05-26 | Discharge: 2019-05-29 | DRG: 689 | Disposition: A | Discharge status: Skilled Nursing Facility | Payer: Medicare Other | Attending: Internal Medicine | Admitting: Internal Medicine

## 2019-05-26 ENCOUNTER — Other Ambulatory Visit: Payer: Self-pay

## 2019-05-26 DIAGNOSIS — Z9981 Dependence on supplemental oxygen: Secondary | ICD-10-CM | POA: Diagnosis not present

## 2019-05-26 DIAGNOSIS — J449 Chronic obstructive pulmonary disease, unspecified: Secondary | ICD-10-CM | POA: Diagnosis present

## 2019-05-26 DIAGNOSIS — I69391 Dysphagia following cerebral infarction: Secondary | ICD-10-CM | POA: Diagnosis not present

## 2019-05-26 DIAGNOSIS — E119 Type 2 diabetes mellitus without complications: Secondary | ICD-10-CM | POA: Diagnosis present

## 2019-05-26 DIAGNOSIS — R1312 Dysphagia, oropharyngeal phase: Secondary | ICD-10-CM | POA: Diagnosis present

## 2019-05-26 DIAGNOSIS — F329 Major depressive disorder, single episode, unspecified: Secondary | ICD-10-CM | POA: Diagnosis present

## 2019-05-26 DIAGNOSIS — Z79899 Other long term (current) drug therapy: Secondary | ICD-10-CM | POA: Diagnosis not present

## 2019-05-26 DIAGNOSIS — Z20828 Contact with and (suspected) exposure to other viral communicable diseases: Secondary | ICD-10-CM | POA: Diagnosis present

## 2019-05-26 DIAGNOSIS — I10 Essential (primary) hypertension: Secondary | ICD-10-CM | POA: Diagnosis present

## 2019-05-26 DIAGNOSIS — N390 Urinary tract infection, site not specified: Secondary | ICD-10-CM | POA: Diagnosis present

## 2019-05-26 DIAGNOSIS — Z7951 Long term (current) use of inhaled steroids: Secondary | ICD-10-CM | POA: Diagnosis not present

## 2019-05-26 DIAGNOSIS — I69322 Dysarthria following cerebral infarction: Secondary | ICD-10-CM

## 2019-05-26 DIAGNOSIS — R4182 Altered mental status, unspecified: Secondary | ICD-10-CM

## 2019-05-26 DIAGNOSIS — J4489 Other specified chronic obstructive pulmonary disease: Secondary | ICD-10-CM

## 2019-05-26 DIAGNOSIS — Z7984 Long term (current) use of oral hypoglycemic drugs: Secondary | ICD-10-CM

## 2019-05-26 DIAGNOSIS — E871 Hypo-osmolality and hyponatremia: Secondary | ICD-10-CM | POA: Diagnosis present

## 2019-05-26 DIAGNOSIS — J9621 Acute and chronic respiratory failure with hypoxia: Secondary | ICD-10-CM

## 2019-05-26 DIAGNOSIS — Z931 Gastrostomy status: Secondary | ICD-10-CM

## 2019-05-26 DIAGNOSIS — E860 Dehydration: Secondary | ICD-10-CM | POA: Diagnosis present

## 2019-05-26 DIAGNOSIS — Z7982 Long term (current) use of aspirin: Secondary | ICD-10-CM | POA: Diagnosis not present

## 2019-05-26 DIAGNOSIS — Z87891 Personal history of nicotine dependence: Secondary | ICD-10-CM

## 2019-05-26 DIAGNOSIS — G9341 Metabolic encephalopathy: Secondary | ICD-10-CM | POA: Diagnosis present

## 2019-05-26 LAB — CBC WITH DIFFERENTIAL/PLATELET
Abs Immature Granulocytes: 0.11 10*3/uL — ABNORMAL HIGH (ref 0.00–0.07)
Basophils Absolute: 0.1 10*3/uL (ref 0.0–0.1)
Basophils Relative: 0 %
Eosinophils Absolute: 0 10*3/uL (ref 0.0–0.5)
Eosinophils Relative: 0 %
HCT: 38.5 % (ref 36.0–46.0)
Hemoglobin: 13.2 g/dL (ref 12.0–15.0)
Immature Granulocytes: 1 %
Lymphocytes Relative: 16 %
Lymphs Abs: 3.6 10*3/uL (ref 0.7–4.0)
MCH: 29.5 pg (ref 26.0–34.0)
MCHC: 34.3 g/dL (ref 30.0–36.0)
MCV: 86.1 fL (ref 80.0–100.0)
Monocytes Absolute: 1.4 10*3/uL — ABNORMAL HIGH (ref 0.1–1.0)
Monocytes Relative: 6 %
Neutro Abs: 17.7 10*3/uL — ABNORMAL HIGH (ref 1.7–7.7)
Neutrophils Relative %: 77 %
Platelets: 226 10*3/uL (ref 150–400)
RBC: 4.47 MIL/uL (ref 3.87–5.11)
RDW: 12.6 % (ref 11.5–15.5)
WBC: 23 10*3/uL — ABNORMAL HIGH (ref 4.0–10.5)
nRBC: 0 % (ref 0.0–0.2)

## 2019-05-26 LAB — GLUCOSE, CAPILLARY: Glucose-Capillary: 114 mg/dL — ABNORMAL HIGH (ref 70–99)

## 2019-05-26 LAB — URINALYSIS, COMPLETE (UACMP) WITH MICROSCOPIC
Bilirubin Urine: NEGATIVE
Glucose, UA: NEGATIVE mg/dL
Hgb urine dipstick: NEGATIVE
Ketones, ur: NEGATIVE mg/dL
Nitrite: NEGATIVE
Protein, ur: NEGATIVE mg/dL
Specific Gravity, Urine: 1.011 (ref 1.005–1.030)
WBC, UA: >50 WBC/hpf — ABNORMAL HIGH (ref 0–5)
pH: 6 (ref 5.0–8.0)

## 2019-05-26 LAB — HEMOGLOBIN A1C
Hgb A1c MFr Bld: 5.5 % (ref 4.8–5.6)
Mean Plasma Glucose: 111.15 mg/dL

## 2019-05-26 LAB — RESPIRATORY PANEL BY RT PCR (FLU A&B, COVID)
Influenza A by PCR: NEGATIVE
Influenza B by PCR: NEGATIVE
SARS Coronavirus 2 by RT PCR: NEGATIVE

## 2019-05-26 LAB — BASIC METABOLIC PANEL
Anion gap: 9 (ref 5–15)
BUN: 20 mg/dL (ref 8–23)
CO2: 26 mmol/L (ref 22–32)
Calcium: 9.5 mg/dL (ref 8.9–10.3)
Chloride: 91 mmol/L — ABNORMAL LOW (ref 98–111)
Creatinine, Ser: 0.58 mg/dL (ref 0.44–1.00)
GFR calc Af Amer: >60 mL/min (ref >60)
GFR calc non Af Amer: >60 mL/min (ref >60)
Glucose, Bld: 134 mg/dL — ABNORMAL HIGH (ref 70–99)
Potassium: 4.8 mmol/L (ref 3.5–5.1)
Sodium: 126 mmol/L — ABNORMAL LOW (ref 135–145)

## 2019-05-26 LAB — LACTIC ACID, PLASMA: Lactic Acid, Venous: 1.4 mmol/L (ref 0.5–1.9)

## 2019-05-26 LAB — TROPONIN I (HIGH SENSITIVITY)
Troponin I (High Sensitivity): 5 ng/L (ref <18)
Troponin I (High Sensitivity): 4 ng/L (ref <18)

## 2019-05-26 LAB — TSH: TSH: 1.027 u[IU]/mL (ref 0.350–4.500)

## 2019-05-26 LAB — POC SARS CORONAVIRUS 2 AG: SARS Coronavirus 2 Ag: NEGATIVE

## 2019-05-26 LAB — OSMOLALITY, URINE: Osmolality, Ur: 363 mosm/kg (ref 300–900)

## 2019-05-26 MED ORDER — IPRATROPIUM-ALBUTEROL 0.5-2.5 (3) MG/3ML IN SOLN
3.0000 mL | RESPIRATORY_TRACT | Status: DC
  Administered 2019-05-26 – 2019-05-27 (×3): 3 mL via RESPIRATORY_TRACT
  Filled 2019-05-26 (×3): qty 3

## 2019-05-26 MED ORDER — VALPROIC ACID 250 MG/5ML PO SOLN
125.0000 mg | Freq: Two times a day (BID) | ORAL | Status: DC
  Administered 2019-05-27 – 2019-05-29 (×6): 125 mg
  Filled 2019-05-26 (×7): qty 5

## 2019-05-26 MED ORDER — SODIUM CHLORIDE 0.9 % IV SOLN
1.0000 g | INTRAVENOUS | Status: DC
  Administered 2019-05-27 – 2019-05-28 (×2): 1 g via INTRAVENOUS
  Filled 2019-05-26: qty 1
  Filled 2019-05-26: qty 10
  Filled 2019-05-26: qty 1

## 2019-05-26 MED ORDER — ASPIRIN 81 MG PO CHEW
81.0000 mg | CHEWABLE_TABLET | Freq: Every day | ORAL | Status: DC
  Administered 2019-05-27 – 2019-05-29 (×3): 81 mg
  Filled 2019-05-26 (×3): qty 1

## 2019-05-26 MED ORDER — ADULT MULTIVITAMIN LIQUID CH
15.0000 mL | Freq: Every day | ORAL | Status: DC
  Administered 2019-05-27: 15 mL
  Filled 2019-05-26: qty 15

## 2019-05-26 MED ORDER — SODIUM CHLORIDE 0.9 % IV SOLN: INTRAVENOUS | Status: DC

## 2019-05-26 MED ORDER — PRO-STAT SUGAR FREE PO LIQD
30.0000 mL | Freq: Two times a day (BID) | ORAL | Status: DC
  Administered 2019-05-27: 30 mL

## 2019-05-26 MED ORDER — SODIUM CHLORIDE 0.9 % IV SOLN: Freq: Once | INTRAVENOUS | Status: AC

## 2019-05-26 MED ORDER — POLYVINYL ALCOHOL 1.4 % OP SOLN
1.0000 [drp] | Freq: Every day | OPHTHALMIC | Status: DC
  Administered 2019-05-27 – 2019-05-29 (×3): 1 [drp] via OPHTHALMIC
  Filled 2019-05-26 (×2): qty 15

## 2019-05-26 MED ORDER — OSMOLITE 1.5 CAL PO LIQD
237.0000 mL | Freq: Every day | ORAL | Status: DC
  Administered 2019-05-27 (×2): 237 mL

## 2019-05-26 MED ORDER — DOCUSATE SODIUM 50 MG/5ML PO LIQD
100.0000 mg | Freq: Two times a day (BID) | ORAL | Status: DC
  Administered 2019-05-27 – 2019-05-29 (×6): 100 mg
  Filled 2019-05-26 (×7): qty 10

## 2019-05-26 MED ORDER — LISINOPRIL 5 MG PO TABS
2.5000 mg | ORAL_TABLET | Freq: Every day | ORAL | Status: DC
  Administered 2019-05-27 – 2019-05-29 (×3): 2.5 mg
  Filled 2019-05-26 (×3): qty 1

## 2019-05-26 MED ORDER — MAGNESIUM HYDROXIDE 400 MG/5ML PO SUSP: 30.0000 mL | Freq: Every day | ORAL | Status: DC | PRN

## 2019-05-26 MED ORDER — CITALOPRAM HYDROBROMIDE 20 MG PO TABS
10.0000 mg | ORAL_TABLET | Freq: Every day | ORAL | Status: DC
  Administered 2019-05-27 – 2019-05-29 (×3): 10 mg via ORAL
  Filled 2019-05-26 (×3): qty 1

## 2019-05-26 MED ORDER — ENOXAPARIN SODIUM 40 MG/0.4ML ~~LOC~~ SOLN
40.0000 mg | SUBCUTANEOUS | Status: DC
  Administered 2019-05-26 – 2019-05-28 (×3): 40 mg via SUBCUTANEOUS
  Filled 2019-05-26 (×3): qty 0.4

## 2019-05-26 MED ORDER — PRAVASTATIN SODIUM 20 MG PO TABS
10.0000 mg | ORAL_TABLET | Freq: Every day | ORAL | Status: DC
  Administered 2019-05-27: 10 mg via ORAL
  Filled 2019-05-26 (×2): qty 1

## 2019-05-26 MED ORDER — INSULIN ASPART 100 UNIT/ML ~~LOC~~ SOLN
0.0000 [IU] | SUBCUTANEOUS | Status: DC
  Administered 2019-05-27: 3 [IU] via SUBCUTANEOUS
  Administered 2019-05-27: 2 [IU] via SUBCUTANEOUS
  Administered 2019-05-27 (×2): 3 [IU] via SUBCUTANEOUS
  Administered 2019-05-27: 2 [IU] via SUBCUTANEOUS
  Administered 2019-05-28 (×2): 5 [IU] via SUBCUTANEOUS
  Administered 2019-05-28: 3 [IU] via SUBCUTANEOUS
  Administered 2019-05-28: 2 [IU] via SUBCUTANEOUS
  Administered 2019-05-29: 3 [IU] via SUBCUTANEOUS
  Administered 2019-05-29: 2 [IU] via SUBCUTANEOUS
  Administered 2019-05-29: 5 [IU] via SUBCUTANEOUS
  Filled 2019-05-26 (×10): qty 1

## 2019-05-26 MED ORDER — GABAPENTIN 250 MG/5ML PO SOLN
300.0000 mg | Freq: Three times a day (TID) | ORAL | Status: DC
  Administered 2019-05-27 – 2019-05-29 (×8): 300 mg
  Filled 2019-05-26 (×12): qty 6

## 2019-05-26 MED ORDER — ORAL CARE MOUTH RINSE
15.0000 mL | Freq: Two times a day (BID) | OROMUCOSAL | Status: DC
  Administered 2019-05-27 – 2019-05-28 (×2): 15 mL via OROMUCOSAL

## 2019-05-26 MED ORDER — SODIUM CHLORIDE 0.9 % IV SOLN
1.0000 g | Freq: Once | INTRAVENOUS | Status: AC
  Administered 2019-05-26: 20:00:00 1 g via INTRAVENOUS
  Filled 2019-05-26: qty 10

## 2019-05-26 MED ORDER — BUDESONIDE 0.25 MG/2ML IN SUSP
0.2500 mg | Freq: Two times a day (BID) | RESPIRATORY_TRACT | Status: DC
  Administered 2019-05-27 – 2019-05-29 (×5): 0.25 mg via RESPIRATORY_TRACT
  Filled 2019-05-26 (×5): qty 2

## 2019-05-26 NOTE — ED Notes (Signed)
Lab has obtained second set of blood cultures.

## 2019-05-26 NOTE — ED Notes (Signed)
hospitalist in to see pt.

## 2019-05-26 NOTE — ED Notes (Signed)
Pt is dry, denies needs, call bell at side. Pt on 2lpm [place] oxygen. Bed in low and locked position with siderails up.
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
## 2019-05-26 NOTE — ED Provider Notes (Signed)
UA consistent with infection, leukocyte esterase, WBC, bacteria, WBC clumps present.  Will initiate ceftriaxone and plan for admission. Receiving IVF for her hypoNa and hypoCl.  CXR negative and COVID testing negative. Patient back down to her baseline 2 L [place].   Will admit to hospitalist.    Lilia Pro., MD 05/26/19 2026

## 2019-05-26 NOTE — ED Notes (Signed)
Pt is alert and oriented to at least person and situation. Pt is able to answer questions regarding pain, work of breathing, and follow basic commands. Pt states she normally has a mild cough  Called pharmacy about missing medications, still awaiting.

## 2019-05-26 NOTE — ED Notes (Signed)
Waiting on mvi, hypromellose, pravastatin, colace, neurontin solution, valproic acid solution, prostat, osomolite, pulmacort to come from pharmacy.

## 2019-05-26 NOTE — ED Notes (Signed)
Kaitlyn Ruiz in pharmacy notified of need for all meds that are pending.

## 2019-05-26 NOTE — ED Triage Notes (Signed)
Pt via EMS from [facility]. Per staff, around 0300 this am pt's O2 sat was in the 80s and that the pt has a low grade fever. Pt is chronically on 2L and sat 96% on arrival. Pt has hx of a stroke and is dysphagic at baseline. Per staff, G tube may be infected.
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
## 2019-05-26 NOTE — ED Notes (Signed)
Pt medications still not available at this time

## 2019-05-27 DIAGNOSIS — J9621 Acute and chronic respiratory failure with hypoxia: Secondary | ICD-10-CM

## 2019-05-27 LAB — BASIC METABOLIC PANEL
Anion gap: 8 (ref 5–15)
BUN: 17 mg/dL (ref 8–23)
CO2: 28 mmol/L (ref 22–32)
Calcium: 9.1 mg/dL (ref 8.9–10.3)
Chloride: 92 mmol/L — ABNORMAL LOW (ref 98–111)
Creatinine, Ser: 0.59 mg/dL (ref 0.44–1.00)
GFR calc Af Amer: >60 mL/min (ref >60)
GFR calc non Af Amer: >60 mL/min (ref >60)
Glucose, Bld: 103 mg/dL — ABNORMAL HIGH (ref 70–99)
Potassium: 4.1 mmol/L (ref 3.5–5.1)
Sodium: 128 mmol/L — ABNORMAL LOW (ref 135–145)

## 2019-05-27 LAB — GLUCOSE, CAPILLARY
Glucose-Capillary: 109 mg/dL — ABNORMAL HIGH (ref 70–99)
Glucose-Capillary: 145 mg/dL — ABNORMAL HIGH (ref 70–99)
Glucose-Capillary: 147 mg/dL — ABNORMAL HIGH (ref 70–99)
Glucose-Capillary: 175 mg/dL — ABNORMAL HIGH (ref 70–99)
Glucose-Capillary: 180 mg/dL — ABNORMAL HIGH (ref 70–99)
Glucose-Capillary: 193 mg/dL — ABNORMAL HIGH (ref 70–99)
Glucose-Capillary: 76 mg/dL (ref 70–99)

## 2019-05-27 LAB — CBC
HCT: 36.7 % (ref 36.0–46.0)
Hemoglobin: 12.8 g/dL (ref 12.0–15.0)
MCH: 29.6 pg (ref 26.0–34.0)
MCHC: 34.9 g/dL (ref 30.0–36.0)
MCV: 85 fL (ref 80.0–100.0)
Platelets: 217 10*3/uL (ref 150–400)
RBC: 4.32 MIL/uL (ref 3.87–5.11)
RDW: 12.8 % (ref 11.5–15.5)
WBC: 19 10*3/uL — ABNORMAL HIGH (ref 4.0–10.5)
nRBC: 0 % (ref 0.0–0.2)

## 2019-05-27 LAB — HIV ANTIBODY (ROUTINE TESTING W REFLEX): HIV Screen 4th Generation wRfx: NONREACTIVE

## 2019-05-27 LAB — OSMOLALITY: Osmolality: 283 mOsm/kg (ref 275–295)

## 2019-05-27 LAB — CORTISOL: Cortisol, Plasma: 5.6 ug/dL

## 2019-05-27 MED ORDER — IPRATROPIUM-ALBUTEROL 0.5-2.5 (3) MG/3ML IN SOLN
3.0000 mL | Freq: Three times a day (TID) | RESPIRATORY_TRACT | Status: DC
  Administered 2019-05-27 – 2019-05-29 (×7): 3 mL via RESPIRATORY_TRACT
  Filled 2019-05-27 (×7): qty 3

## 2019-05-27 MED ORDER — JEVITY 1.5 CAL/FIBER PO LIQD
237.0000 mL | Freq: Every day | ORAL | Status: DC
  Administered 2019-05-27 – 2019-05-29 (×10): 237 mL via ORAL

## 2019-05-27 MED ORDER — IPRATROPIUM-ALBUTEROL 0.5-2.5 (3) MG/3ML IN SOLN: 3.0000 mL | RESPIRATORY_TRACT | Status: DC | PRN

## 2019-05-27 NOTE — Progress Notes (Signed)
FBS 147

## 2019-05-27 NOTE — Progress Notes (Signed)
PROGRESS NOTE    Kaitlyn CreedSharon A Kleinsasser  ZOX:[id number] DOB: 05-05-1956 DOA: 05/26/2019 PCP: Abner GreenspanHodges, Beth, MD    Brief Narrative:  Kaitlyn Ruiz is a 63 y.o. female with medical history significant of CVA with residual dysarthria and dysphagia, PEG tube dependent, history of noninsulin-dependent type 2 diabetes, chronic respiratory failure on O2 at 2 L/min, COPD was sent to the emergency room because she was not acting herself.  Her oxygen saturation at the nursing home was in the 80s and they had to increase her oxygen.  By arrival her oxygen saturation was 96% on her home flow rate of 2 L.  While at the nursing home she reportedly desatted to the 80s on room air and had a low-grade fever.  Patient is unable to tribute fully to history on account of her deficits she could nod and shake her head.Urinalysis showed moderate leukocyte esterase consistent with UTI.  Both flu and Covid test were negative, rapid and PCR.  Chest x-ray showed atelectasis.  Patient was started on IV Rocephin and hospitalist consult requested for admission.    Consultants:   None  Procedures Chest x-ray Low volumes with bibasilar atelectasis or scarring, similar to prior.   Antimicrobials:   Rocephin   Subjective: Patient laying in bed, sleeping this am. But opens eyes when I called her name.  She knows to no shortness of breath or chest pain.  Denies fever or chills.  Objective: Vitals:   05/27/19 0037 05/27/19 0352 05/27/19 0357 05/27/19 0756  BP: 113/71  (!) 128/58 117/64  Pulse: 78  72 77  Resp: 18  16 18   Temp: (!) 97.5 F (36.4 C)  98.2 F (36.8 C) 98.3 F (36.8 C)  TempSrc: Oral  Oral Oral  SpO2: 100% 99% 99% 99%  Weight:      Height:        Intake/Output Summary (Last 24 hours) at 05/27/2019 1523 Last data filed at 05/27/2019 0646 Gross per 24 hour  Intake 1100 ml  Output 200 ml  Net 900 ml   Filed Weights   05/26/19 1412  Weight: 72.6 kg    Examination:  General exam: Appears  calm and comfortable  Respiratory system: anteriorly Clear to auscultation. Respiratory effort normal. Cardiovascular system: S1 & S2 heard, RRR. No JVD, murmurs, rubs, gallops or clicks.  Gastrointestinal system: Abdomen is nondistended, soft and nontender. No organomegaly or masses felt. Normal bowel sounds heard. Central nervous system: awakens, dysarthic unable to understand her, but responds by nodding.  Extremities: Symmetric 5 x 5 power. Skin: warm dry     Data Reviewed: I have personally reviewed following labs and imaging studies  CBC: Recent Labs  Lab 05/26/19 1432 05/27/19 0539  WBC 23.0* 19.0*  NEUTROABS 17.7*  --   HGB 13.2 12.8  HCT 38.5 36.7  MCV 86.1 85.0  PLT 226 217   Basic Metabolic Panel: Recent Labs  Lab 05/26/19 1432 05/27/19 0539  NA 126* 128*  K 4.8 4.1  CL 91* 92*  CO2 26 28  GLUCOSE 134* 103*  BUN 20 17  CREATININE 0.58 0.59  CALCIUM 9.5 9.1   GFR: Estimated Creatinine Clearance: 75.3 mL/min (by C-G formula based on SCr of 0.59 mg/dL). Liver Function Tests: No results for input(s): AST, ALT, ALKPHOS, BILITOT, PROT, ALBUMIN in the last 168 hours. No results for input(s): LIPASE, AMYLASE in the last 168 hours. No results for input(s): AMMONIA in the last 168 hours. Coagulation Profile: No results for input(s): INR, PROTIME  in the last 168 hours. Cardiac Enzymes: No results for input(s): CKTOTAL, CKMB, CKMBINDEX, TROPONINI in the last 168 hours. BNP (last 3 results) No results for input(s): PROBNP in the last 8760 hours. HbA1C: Recent Labs    05/26/19 1432  HGBA1C 5.5   CBG: Recent Labs  Lab 05/26/19 2150 05/27/19 0041 05/27/19 0405 05/27/19 0755 05/27/19 1135  GLUCAP 114* 109* 76 180* 193*   Lipid Profile: No results for input(s): CHOL, HDL, LDLCALC, TRIG, CHOLHDL, LDLDIRECT in the last 72 hours. Thyroid Function Tests: Recent Labs    05/26/19 1432  TSH 1.027   Anemia Panel: No results for input(s): VITAMINB12,  FOLATE, FERRITIN, TIBC, IRON, RETICCTPCT in the last 72 hours. Sepsis Labs: Recent Labs  Lab 05/26/19 1540  LATICACIDVEN 1.4    Recent Results (from the past 240 hour(s))  Blood culture (routine x 2)     Status: None (Preliminary result)   Collection Time: 05/26/19  3:40 PM   Specimen: BLOOD  Result Value Ref Range Status   Specimen Description BLOOD  Final   Special Requests NONE  Final   Culture   Final    NO GROWTH < 12 HOURS Performed at [facility], [address] [place], [place] [address]    Report Status PENDING  Incomplete  Respiratory Panel by RT PCR (Flu A&B, Covid) - Nasopharyngeal Swab     Status: None   Collection Time: 05/26/19  4:09 PM   Specimen: Nasopharyngeal Swab  Result Value Ref Range Status   SARS Coronavirus 2 by RT PCR NEGATIVE NEGATIVE Final    Comment: (NOTE) SARS-CoV-2 target nucleic acids are NOT DETECTED. The SARS-CoV-2 RNA is generally detectable in upper respiratoy specimens during the acute phase of infection. The lowest concentration of SARS-CoV-2 viral copies this assay can detect is 131 copies/mL. A negative result does not preclude SARS-Cov-2 infection and should not be used as the sole basis for treatment or other patient management decisions. A negative result may occur with  improper specimen collection/handling, submission of specimen other than nasopharyngeal swab, presence of viral mutation(s) within the areas targeted by this assay, and inadequate number of viral copies (<131 copies/mL). A negative result must be combined with clinical observations, patient history, and epidemiological information. The expected result is Negative. Fact Sheet for Patients:  https://www.moore.com/ Fact Sheet for Healthcare Providers:  https://www.young.biz/ This test is not yet ap proved or cleared by the [place] FDA and  has been authorized for detection and/or diagnosis of SARS-CoV-2 by FDA  under an Emergency Use Authorization (EUA). This EUA will remain  in effect (meaning this test can be used) for the duration of the COVID-19 declaration under Section 564(b)(1) of the Act, 21 U.S.C. section 360bbb-3(b)(1), unless the authorization is terminated or revoked sooner.    Influenza A by PCR NEGATIVE NEGATIVE Final   Influenza B by PCR NEGATIVE NEGATIVE Final    Comment: (NOTE) The Xpert Xpress SARS-CoV-2/FLU/RSV assay is intended as an aid in  the diagnosis of influenza from Nasopharyngeal swab specimens and  should not be used as a sole basis for treatment. Nasal washings and  aspirates are unacceptable for Xpert Xpress SARS-CoV-2/FLU/RSV  testing. Fact Sheet for Patients: https://www.moore.com/ Fact Sheet for Healthcare Providers: https://www.young.biz/ This test is not yet approved or cleared by the [place] FDA and  has been authorized for detection and/or diagnosis of SARS-CoV-2 by  FDA under an Emergency Use Authorization (EUA). This EUA will remain  in effect (meaning this test can  be used) for the duration of the  Covid-19 declaration under Section 564(b)(1) of the Act, 21  U.S.C. section 360bbb-3(b)(1), unless the authorization is  terminated or revoked. Performed at [facility], [address] [place], [place] [address]   Blood culture (routine x 2)     Status: None (Preliminary result)   Collection Time: 05/26/19  7:45 PM   Specimen: BLOOD  Result Value Ref Range Status   Specimen Description BLOOD BLOOD RIGHT HAND  Final   Special Requests   Final    BOTTLES DRAWN AEROBIC AND ANAEROBIC Blood Culture results may not be optimal due to an inadequate volume of blood received in culture bottles   Culture   Final    NO GROWTH < 12 HOURS Performed at [facility], [address], [place], [place] [address]    Report Status PENDING  Incomplete         Radiology Studies: DG Chest Portable 1  View  Result Date: 05/26/2019 CLINICAL DATA:  Low-grade fever. EXAM: PORTABLE CHEST 1 VIEW COMPARISON:  05/30/2018 FINDINGS: 1354 hours. Asymmetric elevation right hemidiaphragm. Streaky opacity at the lung bases is similar but slightly less prominent than before and likely reflects atelectasis and/or scarring. Mild vascular congestion without edema. No pleural effusion. Cardiopericardial silhouette is at upper limits of normal for size. The visualized bony structures of the thorax are intact. IMPRESSION: Low volumes with bibasilar atelectasis or scarring, similar to prior. Electronically Signed   By: Misty Stanley M.D.   On: 05/26/2019 14:32        Scheduled Meds: . aspirin  81 mg Per Tube Daily  . budesonide  0.25 mg Nebulization BID  . citalopram  10 mg Oral Daily  . docusate  100 mg Per Tube BID  . enoxaparin (LOVENOX) injection  40 mg Subcutaneous Q24H  . feeding supplement (JEVITY 1.5 CAL/FIBER)  237 mL Oral 5 X Daily  . gabapentin  300 mg Per Tube Q8H  . insulin aspart  0-15 Units Subcutaneous Q4H  . ipratropium-albuterol  3 mL Nebulization TID  . lisinopril  2.5 mg Per Tube Daily  . mouth rinse  15 mL Mouth Rinse q12n4p  . polyvinyl alcohol  1 drop Both Eyes Daily  . pravastatin  10 mg Oral q1800  . valproic acid  125 mg Per Tube BID   Continuous Infusions: . sodium chloride 75 mL/hr at 05/27/19 1147  . 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  X-ray no acute findings , low volumes with possible atelectasis and patient tested negative for the flu and Covid --Continue to monitor  Acute metabolic encephalopathy --Patient reported to be less interactive than usual.   -Was lethargic here in the ER --Secondary to urinary tract infection  possibly hyponatremia --Neurologic checks, fall and aspiration precautions --Treat UTI with rocephin -IVF for hydration    Urinary tract infection --Continue IV Rocephin --Follow cultures    Hyponatremia --Serum and urine osmolality, cortisol in the a.m. -IVF for hydration, not correcting too fast -NA today improving slowly    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HCC) --Continue tube feeds --Nutritionist consult    COPD with chronic bronchitis (HCC) --Not acutely exacerbated --Continue home duo nebs and Pulmicort inhalers    Non-insulin dependent type 2 diabetes mellitus (HCC) --Hold home Metformin --Supplemental insulin coverage  History of CVA with residual dysphagia and dysarthria --[facility] nursing care --Continue the platelets, statins, seizure prophylactic meds    DVT prophylaxis: lovenox Code Status: full  Disposition Plan: Back to previous home environment      LOS: 1 day   Time spent: 45 minutes with more than 50% COC    Lynn Ito, MD [facility] Pager [phone]  If 7PM-7AM, please contact night-coverage [email or web address] Password [facility] 05/27/2019, 3:23 PM

## 2019-05-27 NOTE — Progress Notes (Signed)
FBS 145

## 2019-05-27 NOTE — Progress Notes (Signed)
Initial Nutrition Assessment  DOCUMENTATION CODES:   Not applicable  INTERVENTION:  Patient is now on Glucerna at her facility. However we are currently out of stock of Glucerna bolus cartons.   For now provide Jevity 1.5 Cal 1 can bolus 5 times daily per tube. Provides 1775 kcal, 76 grams of protein, 900 mL H2O daily.  Once Glucerna bolus cartons are in initiate Glucerna 1.5 Cal 1 can bolus 5 times daily per tube. Provides 1780 kcal, 98 grams of protein, 900 mL H2O daily.  Flush tube with 30 mL water before and after tube feeding to maintain tube patency. This provides an additional 300 mL daily per tube.  Patient currently on normal saline at 75 mL/hr. Once plan is to resume full free water flush per G-tube recommend providing 180 mL free water flush TID between bolus feeds. This will provide a total of 1740 mL H2O daily including the water in tube feeding and from flushes before and after tube feeding.  Will discontinue MVI as goal TF regimen meets 100% RDIs for vitamins/minerals.  NUTRITION DIAGNOSIS:   Inadequate oral intake related to inability to eat as evidenced by (NPO status, reliance on tube feed regimen to meet calorie/protein/hydration needs.).  GOAL:   Patient will meet greater than or equal to 90% of their needs  MONITOR:   Labs, Weight trends, TF tolerance, I & O's  REASON FOR ASSESSMENT:   Consult Enteral/tube feeding initiation and management  ASSESSMENT:   63 year old female with PMHx of HTN, DM, major depressive disorder, hx CVA, COPD, dysphagia s/p placement of G-tube in 04/2018 admitted from [facility] with acute on chronic respiratory failure with hypoxia that was resolved by arrival, acute metabolic encephalopathy, UTI, hyponatremia.   Met with patient at bedside. She is known to this RD from her previous admission in 04/2018 when she received her G-tube. Patient is confused and unable to provide any history related to tube feeds/weight. Spoke  with Therapist, sports at [facility]. Patient is now on Glucerna 1.5 Cal 1 can bolus 5 times daily. She receives a free water flush of 30 mL before and after each bolus feed and an additional 180 mL TID between feeds.  Patient was 93.5 kg on 05/26/2018. She is now 72.6 kg (160 lbs) but unsure if that is an accurate weight or stated. She was 78.3 kg on 04/13/2019. She has lost 15.2 kg (16.3% body weight) over the past 11 months, which is not significant for time frame.  Enteral Access: G-tube present on admission  Medications reviewed and include: Colace 100 mg BID, gabapentin, Novolog 0-15 units Q4hrs, lisinopril, liquid MVI daily per tube, valproic acid, NS at 75 mL/hr, ceftriaxone.  Labs reviewed: CBG 76-193, Sodium 128, Chloride 92.  Patient does not meet criteria for malnutrition at this time.  NUTRITION - FOCUSED PHYSICAL EXAM:    Most Recent Value  Orbital Region  No depletion  Upper Arm Region  No depletion  Thoracic and Lumbar Region  No depletion  Buccal Region  No depletion  Temple Region  No depletion  Clavicle Bone Region  No depletion  Clavicle and Acromion Bone Region  No depletion  Scapular Bone Region  Unable to assess  Dorsal Hand  No depletion  Patellar Region  Mild depletion  Anterior Thigh Region  Mild depletion  Posterior Calf Region  Mild depletion  Edema (RD Assessment)  None  Hair  Reviewed  Eyes  Reviewed  Mouth  Reviewed  Skin  Reviewed  Nails  Reviewed     Diet Order:   Diet Order    None     EDUCATION NEEDS:   No education needs have been identified at this time  Skin:  Skin Assessment: Reviewed RN Assessment  Last BM:  05/27/2019 per chart  Height:   Ht Readings from Last 1 Encounters:  05/26/19 '5\' 6"'  (1.676 m)   Weight:   Wt Readings from Last 1 Encounters:  05/26/19 72.6 kg   Ideal Body Weight:  59.1 kg  BMI:  Body mass index is 25.82 kg/m.  Estimated Nutritional Needs:   Kcal:  1700-1900  Protein:  85-95 grams  Fluid:   1.7-1.9 L/day  Jacklynn Barnacle, MS, RD, LDN Office: [phone] Pager: [phone] After Hours/Weekend Pager: [phone]

## 2019-05-27 NOTE — ED Notes (Signed)
Pt cleansed of urine in brief.

## 2019-05-28 LAB — BASIC METABOLIC PANEL
Anion gap: 9 (ref 5–15)
BUN: 11 mg/dL (ref 8–23)
CO2: 24 mmol/L (ref 22–32)
Calcium: 8.7 mg/dL — ABNORMAL LOW (ref 8.9–10.3)
Chloride: 100 mmol/L (ref 98–111)
Creatinine, Ser: 0.47 mg/dL (ref 0.44–1.00)
GFR calc Af Amer: >60 mL/min (ref >60)
GFR calc non Af Amer: >60 mL/min (ref >60)
Glucose, Bld: 117 mg/dL — ABNORMAL HIGH (ref 70–99)
Potassium: 4.3 mmol/L (ref 3.5–5.1)
Sodium: 133 mmol/L — ABNORMAL LOW (ref 135–145)

## 2019-05-28 LAB — CBC
HCT: 38.8 % (ref 36.0–46.0)
Hemoglobin: 12.6 g/dL (ref 12.0–15.0)
MCH: 29.3 pg (ref 26.0–34.0)
MCHC: 32.5 g/dL (ref 30.0–36.0)
MCV: 90.2 fL (ref 80.0–100.0)
Platelets: 170 10*3/uL (ref 150–400)
RBC: 4.3 MIL/uL (ref 3.87–5.11)
RDW: 12.9 % (ref 11.5–15.5)
WBC: 11.1 10*3/uL — ABNORMAL HIGH (ref 4.0–10.5)
nRBC: 0 % (ref 0.0–0.2)

## 2019-05-28 LAB — GLUCOSE, CAPILLARY
Glucose-Capillary: 147 mg/dL — ABNORMAL HIGH (ref 70–99)
Glucose-Capillary: 177 mg/dL — ABNORMAL HIGH (ref 70–99)
Glucose-Capillary: 192 mg/dL — ABNORMAL HIGH (ref 70–99)
Glucose-Capillary: 213 mg/dL — ABNORMAL HIGH (ref 70–99)
Glucose-Capillary: 226 mg/dL — ABNORMAL HIGH (ref 70–99)
Glucose-Capillary: 76 mg/dL (ref 70–99)

## 2019-05-28 NOTE — TOC Progression Note (Signed)
Transition of Care [facility]) - Progression Note    Patient Details  Name: Kaitlyn Ruiz MRN: [id number] Date of Birth: [birth year]  [facility] CM/SW Contact  Shelbie Ammons, RN Phone Number: 05/28/2019, 10:40 AM  Clinical Narrative:     RNCM received return call from sister who reports it is there plan for patient to return to [facility] at discharge. Placed call to [facility] with [facility] [facility] and they will accept patient back as she is one of their long term patients.          Expected Discharge Plan and Services                                                 Social Determinants of Health (SDOH) Interventions    Readmission Risk Interventions No flowsheet data found.

## 2019-05-28 NOTE — Progress Notes (Signed)
PROGRESS NOTE    Kaitlyn CreedSharon A Urizar  WJX:[id number] DOB: May 16, 1956 DOA: 05/26/2019 PCP: Abner GreenspanHodges, Beth, MD    Brief Narrative:  Kaitlyn Ruiz is a 63 y.o. female with medical history significant of CVA with residual dysarthria and dysphagia, PEG tube dependent, history of noninsulin-dependent type 2 diabetes, chronic respiratory failure on O2 at 2 L/min, COPD was sent to the emergency room because she was not acting herself.  Her oxygen saturation at the nursing home was in the 80s and they had to increase her oxygen.  By arrival her oxygen saturation was 96% on her home flow rate of 2 L.  While at the nursing home she reportedly desatted to the 80s on room air and had a low-grade fever.  Patient is unable to tribute fully to history on account of her deficits she could nod and shake her head.Urinalysis showed moderate leukocyte esterase consistent with UTI.  Both flu and Covid test were negative, rapid and PCR.  Chest x-ray showed atelectasis.  Patient was started on IV Rocephin and hospitalist consult requested for admission.    Consultants:   None  Procedures Chest x-ray Low volumes with bibasilar atelectasis or scarring, similar to prior.   Antimicrobials:   Rocephin   Subjective: Patient is compensating today.  I can actually understand her.  She denies any shortness of breath, chest pain, fever or any other symptoms.    Objective: Vitals:   05/27/19 2350 05/28/19 0745 05/28/19 0756 05/28/19 1344  BP: (!) 120/47 127/70    Pulse: 80 72    Resp: 16 17    Temp: 98.8 F (37.1 C) 97.7 F (36.5 C)    TempSrc: Oral Oral    SpO2: 100% 100% 98% 100%  Weight:      Height:        Intake/Output Summary (Last 24 hours) at 05/28/2019 1527 Last data filed at 05/28/2019 1200 Gross per 24 hour  Intake 1745.22 ml  Output 1350 ml  Net 395.22 ml   Filed Weights   05/26/19 1412  Weight: 72.6 kg    Examination:  General exam: Appears calm and comfortable , nad Respiratory  system: anteriorly Clear to auscultation. Respiratory effort normal. Cardiovascular system: S1 & S2 heard, RRR. No JVD, murmurs, rubs, gallops or clicks.  Gastrointestinal system: Abdomen is nondistended, soft and nontender.  Normal bowel sounds heard. Central nervous system: awakens,less confused, understandable when she talks. Disoriented as she did not know she was in the hospital . Extremities: no edema Skin: warm dry     Data Reviewed: I have personally reviewed following labs and imaging studies  CBC: Recent Labs  Lab 05/26/19 1432 05/27/19 0539 05/28/19 0926  WBC 23.0* 19.0* 11.1*  NEUTROABS 17.7*  --   --   HGB 13.2 12.8 12.6  HCT 38.5 36.7 38.8  MCV 86.1 85.0 90.2  PLT 226 217 170   Basic Metabolic Panel: Recent Labs  Lab 05/26/19 1432 05/27/19 0539 05/28/19 0926  NA 126* 128* 133*  K 4.8 4.1 4.3  CL 91* 92* 100  CO2 26 28 24   GLUCOSE 134* 103* 117*  BUN 20 17 11   CREATININE 0.58 0.59 0.47  CALCIUM 9.5 9.1 8.7*   GFR: Estimated Creatinine Clearance: 75.3 mL/min (by C-G formula based on SCr of 0.47 mg/dL). Liver Function Tests: No results for input(s): AST, ALT, ALKPHOS, BILITOT, PROT, ALBUMIN in the last 168 hours. No results for input(s): LIPASE, AMYLASE in the last 168 hours. No results for input(s): AMMONIA in  the last 168 hours. Coagulation Profile: No results for input(s): INR, PROTIME in the last 168 hours. Cardiac Enzymes: No results for input(s): CKTOTAL, CKMB, CKMBINDEX, TROPONINI in the last 168 hours. BNP (last 3 results) No results for input(s): PROBNP in the last 8760 hours. HbA1C: Recent Labs    05/26/19 1432  HGBA1C 5.5   CBG: Recent Labs  Lab 05/27/19 2005 05/27/19 2347 05/28/19 0356 05/28/19 0744 05/28/19 1136  GLUCAP 145* 147* 76 147* 213*   Lipid Profile: No results for input(s): CHOL, HDL, LDLCALC, TRIG, CHOLHDL, LDLDIRECT in the last 72 hours. Thyroid Function Tests: Recent Labs    05/26/19 1432  TSH 1.027    Anemia Panel: No results for input(s): VITAMINB12, FOLATE, FERRITIN, TIBC, IRON, RETICCTPCT in the last 72 hours. Sepsis Labs: Recent Labs  Lab 05/26/19 1540  LATICACIDVEN 1.4    Recent Results (from the past 240 hour(s))  Blood culture (routine x 2)     Status: None (Preliminary result)   Collection Time: 05/26/19  3:40 PM   Specimen: BLOOD  Result Value Ref Range Status   Specimen Description BLOOD  Final   Special Requests NONE  Final   Culture   Final    NO GROWTH 2 DAYS Performed at [facility], [address] [place], [place] [address]    Report Status PENDING  Incomplete  Respiratory Panel by RT PCR (Flu A&B, Covid) - Nasopharyngeal Swab     Status: None   Collection Time: 05/26/19  4:09 PM   Specimen: Nasopharyngeal Swab  Result Value Ref Range Status   SARS Coronavirus 2 by RT PCR NEGATIVE NEGATIVE Final    Comment: (NOTE) SARS-CoV-2 target nucleic acids are NOT DETECTED. The SARS-CoV-2 RNA is generally detectable in upper respiratoy specimens during the acute phase of infection. The lowest concentration of SARS-CoV-2 viral copies this assay can detect is 131 copies/mL. A negative result does not preclude SARS-Cov-2 infection and should not be used as the sole basis for treatment or other patient management decisions. A negative result may occur with  improper specimen collection/handling, submission of specimen other than nasopharyngeal swab, presence of viral mutation(s) within the areas targeted by this assay, and inadequate number of viral copies (<131 copies/mL). A negative result must be combined with clinical observations, patient history, and epidemiological information. The expected result is Negative. Fact Sheet for Patients:  PinkCheek.be Fact Sheet for Healthcare Providers:  GravelBags.it This test is not yet ap proved or cleared by the [place] FDA and  has been authorized  for detection and/or diagnosis of SARS-CoV-2 by FDA under an Emergency Use Authorization (EUA). This EUA will remain  in effect (meaning this test can be used) for the duration of the COVID-19 declaration under Section 564(b)(1) of the Act, 21 U.S.C. section 360bbb-3(b)(1), unless the authorization is terminated or revoked sooner.    Influenza A by PCR NEGATIVE NEGATIVE Final   Influenza B by PCR NEGATIVE NEGATIVE Final    Comment: (NOTE) The Xpert Xpress SARS-CoV-2/FLU/RSV assay is intended as an aid in  the diagnosis of influenza from Nasopharyngeal swab specimens and  should not be used as a sole basis for treatment. Nasal washings and  aspirates are unacceptable for Xpert Xpress SARS-CoV-2/FLU/RSV  testing. Fact Sheet for Patients: PinkCheek.be Fact Sheet for Healthcare Providers: GravelBags.it This test is not yet approved or cleared by the [place] FDA and  has been authorized for detection and/or diagnosis of SARS-CoV-2 by  FDA under an Emergency Use Authorization (EUA).  This EUA will remain  in effect (meaning this test can be used) for the duration of the  Covid-19 declaration under Section 564(b)(1) of the Act, 21  U.S.C. section 360bbb-3(b)(1), unless the authorization is  terminated or revoked. Performed at [facility], [address] [place], [place] [address]   Blood culture (routine x 2)     Status: None (Preliminary result)   Collection Time: 05/26/19  7:45 PM   Specimen: BLOOD  Result Value Ref Range Status   Specimen Description BLOOD BLOOD RIGHT HAND  Final   Special Requests   Final    BOTTLES DRAWN AEROBIC AND ANAEROBIC Blood Culture results may not be optimal due to an inadequate volume of blood received in culture bottles   Culture   Final    NO GROWTH 2 DAYS Performed at [facility], [address], [place], [place] [address]    Report Status PENDING  Incomplete          Radiology Studies: No results found.      Scheduled Meds: . aspirin  81 mg Per Tube Daily  . budesonide  0.25 mg Nebulization BID  . citalopram  10 mg Oral Daily  . docusate  100 mg Per Tube BID  . enoxaparin (LOVENOX) injection  40 mg Subcutaneous Q24H  . feeding supplement (JEVITY 1.5 CAL/FIBER)  237 mL Oral 5 X Daily  . gabapentin  300 mg Per Tube Q8H  . insulin aspart  0-15 Units Subcutaneous Q4H  . ipratropium-albuterol  3 mL Nebulization TID  . lisinopril  2.5 mg Per Tube Daily  . mouth rinse  15 mL Mouth Rinse q12n4p  . polyvinyl alcohol  1 drop Both Eyes Daily  . pravastatin  10 mg Oral q1800  . valproic acid  125 mg Per Tube BID   Continuous Infusions: . sodium chloride 75 mL/hr at 05/27/19 2246  . 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  X-ray no acute findings , low volumes with possible atelectasis and patient tested negative for the flu and Covid --Continue to monitor  Acute metabolic encephalopathy --Patient reported to be less interactive than usual.   -Was lethargic here in the ER --Secondary to urinary tract infection possibly hyponatremia Has improved today as this is the first day that she is very  cooperative with exam and communicating with me --Continue to treat UTI with rocephin -Continue with IVF for hydration    Urinary tract infection --Continue IV Rocephin --Follow cultures pending    Hyponatremia Secondary to dehydration Improving with IV hydration, will decrease the rate to 50 mL/h as her sodium is 133 To prevent too fast of correction     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HCC) --Continue tube feeds --Nutritionist consult  COPD with chronic bronchitis (HCC) --Not acutely exacerbated --Continue home duo nebs and Pulmicort inhalers    Non-insulin dependent type 2 diabetes mellitus (HCC) --Hold home Metformin --Supplemental insulin coverage  History of CVA with residual dysphagia and dysarthria --[facility] nursing [facility] --Continue the platelets, statins, seizure prophylactic meds    DVT prophylaxis: lovenox Code Status: full  Disposition Plan: Back to previous home environment      LOS: 2 days   Time spent: 45 minutes with more than 50% COC    Lynn Ito, MD [facility] Pager [phone]  If 7PM-7AM, please contact night-coverage [email or web address] Password [facility] 05/28/2019, 3:27 PM Patient ID: Kaitlyn Creed, female   DOB: 09-06-1955, 64 y.o.   MRN: [id number]

## 2019-05-28 NOTE — TOC Initial Note (Signed)
Transition of Care [facility]) - Initial/Assessment Note    Patient Details  Name: Kaitlyn Ruiz MRN: [id number] Date of Birth: 1955/11/17  Transition of Care [facility]) CM/SW Contact:    Trenton Founds, RN Phone Number: 05/28/2019, 10:09 AM  Clinical Narrative:           RNCM placed call into patient's room to discuss discharge plans, no answer. Placed call to patient's sister but was unable to leave VM on cell phone, did leave VM on home phone.                 Patient Goals and CMS Choice        Expected Discharge Plan and Services                                                Prior Living Arrangements/Services                       Activities of Daily Living Home Assistive Devices/Equipment: Wheelchair ADL Screening (condition at time of admission) Patient's cognitive ability adequate to safely complete daily activities?: No Is the patient deaf or have difficulty hearing?: No Does the patient have difficulty seeing, even when wearing glasses/contacts?: No Does the patient have difficulty concentrating, remembering, or making decisions?: No Patient able to express need for assistance with ADLs?: Yes Does the patient have difficulty dressing or bathing?: Yes Independently performs ADLs?: No Communication: Dependent Is this a change from baseline?: Pre-admission baseline Dressing (OT): Dependent Is this a change from baseline?: Pre-admission baseline Grooming: Dependent Is this a change from baseline?: Pre-admission baseline Feeding: Dependent Is this a change from baseline?: Pre-admission baseline Bathing: Dependent Is this a change from baseline?: Pre-admission baseline Toileting: Dependent Is this a change from baseline?: Pre-admission baseline In/Out Bed: Dependent Is this a change from baseline?: Pre-admission baseline Walks in Home: Dependent Is this a change from baseline?: Pre-admission baseline Does the patient have difficulty walking or  climbing stairs?: Yes Weakness of Legs: Both Weakness of Arms/Hands: Both  Permission Sought/Granted                  Emotional Assessment              Admission diagnosis:  UTI (urinary tract infection) [N39.0] Altered mental status, unspecified altered mental status type [R41.82] Urinary tract infection in elderly patient [N39.0] Patient Active Problem List   Diagnosis Date Noted  . 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 Acute on chronic respiratory failure with hypoxia (HCC) 05/26/2019  . Urinary tract infection 05/26/2019  . 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 Non-insulin dependent type 2 diabetes mellitus (HCC) 05/26/2019  . Hyponatremia 05/26/2019  . UTI (urinary tract infection) 05/26/2019  . 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 Dysphagia as late effect of cerebrovascular accident (CVA) 05/26/2019  . Appetite impaired 08/15/2018  . Anorexia 06/05/2018  . Weakness generalized 06/05/2018  . Esophageal dysphagia   . Oropharyngeal dysphagia   . Neurological dysfunction   . Endotracheal tube present   . Palliative care encounter   . Acute respiratory failure with hypoxemia (HCC) 05/14/2018  . Acute metabolic encephalopathy   . Septic shock (HCC) 04/10/2017   PCP:  Abner Greenspan, MD Pharmacy:   [facility] - [place], [place] - [phone]  [place] [address] Phone: [phone] Fax: [phone]     Social Determinants of Health (SDOH) Interventions    Readmission Risk Interventions No flowsheet data found.

## 2019-05-29 LAB — BASIC METABOLIC PANEL
Anion gap: 9 (ref 5–15)
BUN: 9 mg/dL (ref 8–23)
CO2: 26 mmol/L (ref 22–32)
Calcium: 9.2 mg/dL (ref 8.9–10.3)
Chloride: 100 mmol/L (ref 98–111)
Creatinine, Ser: 0.54 mg/dL (ref 0.44–1.00)
GFR calc Af Amer: >60 mL/min (ref >60)
GFR calc non Af Amer: >60 mL/min (ref >60)
Glucose, Bld: 73 mg/dL (ref 70–99)
Potassium: 5.2 mmol/L — ABNORMAL HIGH (ref 3.5–5.1)
Sodium: 135 mmol/L (ref 135–145)

## 2019-05-29 LAB — GLUCOSE, CAPILLARY
Glucose-Capillary: 74 mg/dL (ref 70–99)
Glucose-Capillary: 136 mg/dL — ABNORMAL HIGH (ref 70–99)
Glucose-Capillary: 217 mg/dL — ABNORMAL HIGH (ref 70–99)

## 2019-05-29 MED ORDER — RISAQUAD PO CAPS
1.0000 | ORAL_CAPSULE | Freq: Three times a day (TID) | ORAL | Status: DC
  Filled 2019-05-29 (×3): qty 1

## 2019-05-29 MED ORDER — SODIUM ZIRCONIUM CYCLOSILICATE 5 G PO PACK
5.0000 g | PACK | Freq: Once | ORAL | Status: AC
  Administered 2019-05-29: 5 g via ORAL
  Filled 2019-05-29: qty 1

## 2019-05-29 MED ORDER — CEPHALEXIN 500 MG PO CAPS: 500.0000 mg | ORAL_CAPSULE | Freq: Four times a day (QID) | ORAL | Status: DC

## 2019-05-29 MED ORDER — CEPHALEXIN 500 MG PO CAPS
500.0000 mg | ORAL_CAPSULE | Freq: Four times a day (QID) | ORAL | Status: DC
  Administered 2019-05-29: 500 mg via ORAL
  Filled 2019-05-29: qty 1

## 2019-05-29 MED ORDER — RISAQUAD PO CAPS: 1.0000 | ORAL_CAPSULE | Freq: Three times a day (TID) | ORAL | Status: DC

## 2019-05-29 NOTE — Care Management Important Message (Signed)
Important Message  Patient Details  Name: Kaitlyn Ruiz MRN: [id number] Date of Birth: 1955-08-05   Medicare Important Message Given:  Yes  Initial Important Message signed.   Juliann Pulse A Jenipher Havel 05/29/2019, 1:00 PM

## 2019-05-29 NOTE — Discharge Summary (Signed)
Kaitlyn Ruiz:[id number] DOB: 10-06-1955 DOA: 05/26/2019  PCP: Abner Greenspan, MD  Admit date: 05/26/2019 Discharge date: 05/29/2019  Admitted From: [facility] Disposition: [facility]  Recommendations for Outpatient Follow-up:  1. Follow up with PCP in 1 to 3 days for BP management 2. Please obtain BMP/CBC in one week 3. Please follow up on the following pending results: Final blood cultures  Home Health: None   Discharge Condition:Stable CODE STATUS: Full Diet recommendation: Resume previous diet  Brief/Interim Summary: Kaitlyn Savarino Jonesis a 63 y.o.femalewith medical history significant ofCVA with residual dysarthria and dysphagia, PEG tube dependent, history of noninsulin-dependent type 2 diabetes, chronic respiratory failure on O2 at 2 L/min, COPD was sent to the emergency room because she was not acting herself. Her oxygen saturation at the nursing home was in the 80s and they had to increase her oxygen. By arrival her oxygen saturation was 96% on her home flow rate of 2 L. While at the nursing home she reportedly desatted to the 80s on room air and had a low-grade fever. Patient is unable to tribute fully to history on account of her deficits she could nod and shake her head.Urinalysis showed moderate leukocyte esterase consistent with UTI. Both flu and Covid test were negative, rapid and PCR. Chest x-ray showed atelectasis. Patient was started on IV Rocephin and hospitalist consult requested for admission.  Her antibiotics were continued.  She was found with hyponatremia with a sodium of 128 was thought it was due to dehydration.  She was started on normal saline for hydration with improvement of her sodium.  On presentation she had acute metabolic encephalopathy due to hyponatremia and urinary tract infection.  With IV antibiotics and IV fluids for hydration her mentation improved.  Her baseline 2 L of oxygen throughout her hospitalization.  She remained  afebrile. Registered dietitian was consulted and following instructions were made: For now provide Jevity 1.5 Cal 1 can bolus 5 times daily per tube. Provides 1775 kcal, 76 grams of protein, 900 mL H2O daily. Once Glucerna bolus cartons are in initiate Glucerna 1.5 Cal 1 can bolus 5 times daily per tube. Provides 1780 kcal, 98 grams of protein, 900 mL H2O daily. Flush tube with 30 mL water before and after tube feeding to maintain tube patency. This provides an additional 300 mL daily per tube. Patient currently on normal saline at 75 mL/hr. Once plan is to resume full free water flush per G-tube recommend providing 180 mL free water flush TID between bolus feeds. This will provide a total of 1740 mL H2O daily including the water in tube feeding and from flushes before and after tube feeding. Her leukocytosis on admission was 19 and repeat yesterday was 11.1.  Again she has been afebrile.  Her sodium level is 135 today.  IV fluids have been discontinued.  Her potassium was 5.2 and she received a dose of Lokol.  She will need her blood work done in am.     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   Increase activity slowly   Complete by: As directed       Allergies as of 05/29/2019   No Known Allergies     Medication List    TAKE these medications   acidophilus Caps capsule Take 1 capsule by mouth 3 (three) times daily.   aspirin 81 MG chewable tablet Place 1 tablet (81 mg total) into feeding tube daily.   budesonide 0.25 MG/2ML nebulizer solution Commonly known as: PULMICORT Take 2 mLs (0.25 mg total) by nebulization 2 (two) times daily.   cephALEXin 500 MG capsule Commonly known as: KEFLEX Take 1 capsule (500 mg total) by mouth every 6 (six) hours. For two days, ending 12/31   Cholecalciferol 1.25 MG (50000 UT) Tabs 1 tablet by PEG Tube route as directed. Every 60 days   citalopram 10 MG tablet Commonly known as: CELEXA Take 10 mg by mouth daily.   docusate 50 MG/5ML liquid Commonly known as: COLACE Place 10 mLs (100 mg total) into feeding tube 2 (two) times daily.   feeding supplement (OSMOLITE 1.5 CAL) Liqd Place 237 mLs into feeding tube 5 (five) times daily.   feeding supplement (PRO-STAT SUGAR FREE 64) Liqd Place 30 mLs into feeding tube 2 (two) times daily.   free water Soln Place 180 mLs into feeding tube 5 (five) times daily.   gabapentin 250 MG/5ML solution Commonly known as: NEURONTIN Place 6 mLs (300 mg total) into feeding tube every 8 (eight) hours.   Hypromellose 0.4 % Soln Place 1 drop into both eyes daily.   ipratropium-albuterol 0.5-2.5 (3) MG/3ML Soln Commonly known as: DUONEB Take 3 mLs by nebulization every 4 (four) hours.   lisinopril 2.5 MG tablet Commonly known as: ZESTRIL Place 1 tablet (2.5 mg total) into feeding tube daily.   magnesium hydroxide 400 MG/5ML suspension Commonly known as: MILK OF MAGNESIA Place 30 mLs into feeding tube daily as needed for mild constipation.   metFORMIN 500 MG tablet Commonly known as: GLUCOPHAGE Take 500 mg by mouth 2 (two) times daily.   mouth rinse Liqd solution 15 mLs by Mouth Rinse route 2 times daily at 12 noon and 4 pm.    Multi-Vitamins Tabs 1 tablet by PEG Tube route daily.   polyethylene glycol 17 g packet Commonly known as: MIRALAX / GLYCOLAX 17 g by Per NG tube route daily as needed (no bowel movement x 24 hours).   pravastatin 10 MG tablet Commonly known as: PRAVACHOL Take 10 mg by mouth at bedtime.   scopolamine 1 MG/3DAYS Commonly known as: TRANSDERM-SCOP Place 1 patch (1.5 mg total) onto the skin every 3 (three) days.   valproic acid 250 MG/5ML solution Commonly known as: DEPAKENE Place 2.5 mLs (125 mg total) into feeding tube 2 (two) times daily.      Follow-up Information    Abner Greenspan, MD Follow up in 3 day(s).   Specialty: Family Medicine Why: Scheryl Darter Contact information: [address] [place] [address] [phone]          No Known Allergies  Consultations:  RD   Procedures/Studies: DG Chest Portable 1 View  Result Date: 05/26/2019 CLINICAL DATA:  Low-grade fever. EXAM: PORTABLE  CHEST 1 VIEW COMPARISON:  05/30/2018 FINDINGS: 1354 hours. Asymmetric elevation right hemidiaphragm. Streaky opacity at the lung bases is similar but slightly less prominent than before and likely reflects atelectasis and/or scarring. Mild vascular congestion without edema. No pleural effusion. Cardiopericardial silhouette is at upper limits of normal for size. The visualized bony structures of the thorax are intact. IMPRESSION: Low volumes with bibasilar atelectasis or scarring, similar to prior. Electronically Signed   By: Kennith CenterEric  Mansell M.D.   On: 05/26/2019 14:32      Subjective: Patient is sleepy this a.m. but opens eyes for questioning and cooperates with exam.  She nods no to chest pain, fever, chills, abdominal pain or shortness of breath.  Discharge Exam: Vitals:   05/29/19 0737 05/29/19 0750  BP: 133/62   Pulse: 64   Resp: 18   Temp: (!) 97.4 F (36.3 C)   SpO2: 100% 100%   Vitals:   05/28/19 1945 05/28/19 2001 05/29/19 0737 05/29/19 0750  BP:  134/68  133/62   Pulse: 82  64   Resp: 18  18   Temp: 98.2 F (36.8 C)  (!) 97.4 F (36.3 C)   TempSrc: Oral  Oral   SpO2: 100% 100% 100% 100%  Weight:      Height:        General: Pt is alert, awakens when called by name, not in acute distress Cardiovascular: RRR, S1/S2 +, no rubs, no gallops Respiratory: CTA bilaterally, no wheezing, no rhonchi Abdominal: Soft, NT, ND, bowel sounds +  Extremities: Mild edema bilaterally    The results of significant diagnostics from this hospitalization (including imaging, microbiology, ancillary and laboratory) are listed below for reference.     Microbiology: Recent Results (from the past 240 hour(s))  Blood culture (routine x 2)     Status: None (Preliminary result)   Collection Time: 05/26/19  3:40 PM   Specimen: BLOOD  Result Value Ref Range Status   Specimen Description BLOOD  Final   Special Requests NONE  Final   Culture   Final    NO GROWTH 3 DAYS Performed at [facility], [address] [place], [place] [address]    Report Status PENDING  Incomplete  Respiratory Panel by RT PCR (Flu A&B, Covid) - Nasopharyngeal Swab     Status: None   Collection Time: 05/26/19  4:09 PM   Specimen: Nasopharyngeal Swab  Result Value Ref Range Status   SARS Coronavirus 2 by RT PCR NEGATIVE NEGATIVE Final    Comment: (NOTE) SARS-CoV-2 target nucleic acids are NOT DETECTED. The SARS-CoV-2 RNA is generally detectable in upper respiratoy specimens during the acute phase of infection. The lowest concentration of SARS-CoV-2 viral copies this assay can detect is 131 copies/mL. A negative result does not preclude SARS-Cov-2 infection and should not be used as the sole basis for treatment or other patient management decisions. A negative result may occur with  improper specimen collection/handling, submission of specimen other than nasopharyngeal swab, presence of viral mutation(s) within the areas targeted by this assay, and inadequate  number of viral copies (<131 copies/mL). A negative result must be combined with clinical observations, patient history, and epidemiological information. The expected result is Negative. Fact Sheet for Patients:  https://www.moore.com/https://www.fda.gov/media/142436/download Fact Sheet for Healthcare Providers:  https://www.young.biz/https://www.fda.gov/media/142435/download This test is not yet ap proved or cleared by the [place] FDA and  has been authorized for detection and/or diagnosis of SARS-CoV-2 by FDA under an Emergency Use Authorization (EUA). This EUA will remain  in effect (meaning this test can be used) for the duration of the COVID-19 declaration under Section 564(b)(1) of the Act, 21 U.S.C. section 360bbb-3(b)(1), unless the authorization is terminated or revoked sooner.    Influenza A by PCR NEGATIVE NEGATIVE Final   Influenza B by PCR NEGATIVE NEGATIVE Final    Comment: (NOTE) The Xpert Xpress SARS-CoV-2/FLU/RSV assay is intended as an aid in  the diagnosis of influenza from Nasopharyngeal swab specimens and  should not be used as a sole basis for treatment. Nasal washings and  aspirates are unacceptable for Xpert Xpress SARS-CoV-2/FLU/RSV  testing. Fact Sheet for Patients: https://www.moore.com/ Fact Sheet for Healthcare Providers: https://www.young.biz/ This test is not yet approved or cleared by the [place] FDA and  has been authorized for detection and/or diagnosis of SARS-CoV-2 by  FDA under an Emergency Use Authorization (EUA). This EUA will remain  in effect (meaning this test can be used) for the duration of the  Covid-19 declaration under Section 564(b)(1) of the Act, 21  U.S.C. section 360bbb-3(b)(1), unless the authorization is  terminated or revoked. Performed at [facility], [address] [place], [place] [address]   Blood culture (routine x 2)     Status: None (Preliminary result)   Collection Time: 05/26/19  7:45 PM    Specimen: BLOOD  Result Value Ref Range Status   Specimen Description BLOOD BLOOD RIGHT HAND  Final   Special Requests   Final    BOTTLES DRAWN AEROBIC AND ANAEROBIC Blood Culture results may not be optimal due to an inadequate volume of blood received in culture bottles   Culture   Final    NO GROWTH 3 DAYS Performed at [facility], [address], [place], [place] [address]    Report Status PENDING  Incomplete     Labs: BNP (last 3 results) No results for input(s): BNP in the last 8760 hours. Basic Metabolic Panel: Recent Labs  Lab 05/26/19 1432 05/27/19 0539 05/28/19 0926 05/29/19 0435  NA 126* 128* 133* 135  K 4.8 4.1 4.3 5.2*  CL 91* 92* 100 100  CO2 GLUCOSE 134* 103* 117* 73  BUN CREATININE 0.58 0.59 0.47 0.54  CALCIUM 9.5 9.1 8.7* 9.2   Liver Function Tests: No results for input(s): AST, ALT, ALKPHOS, BILITOT, PROT, ALBUMIN in the last 168 hours. No results for input(s): LIPASE, AMYLASE in the last 168 hours. No results for input(s): AMMONIA in the last 168 hours. CBC: Recent Labs  Lab 05/26/19 1432 05/27/19 0539 05/28/19 0926  WBC 23.0* 19.0* 11.1*  NEUTROABS 17.7*  --   --   HGB 13.2 12.8 12.6  HCT 38.5 36.7 38.8  MCV 86.1 85.0 90.2  PLT 226 217 170   Cardiac Enzymes: No results for input(s): CKTOTAL, CKMB, CKMBINDEX, TROPONINI in the last 168 hours. BNP: Invalid input(s): POCBNP CBG: Recent Labs  Lab 05/28/19 1623 05/28/19 1946 05/28/19 2343 05/29/19 0408 05/29/19 0739  GLUCAP 192* 226* 177* 74 136*   D-Dimer No results for input(s): DDIMER in the last 72 hours. Hgb A1c Recent Labs    05/26/19 1432  HGBA1C 5.5   Lipid Profile No results for input(s): CHOL, HDL, LDLCALC, TRIG, CHOLHDL, LDLDIRECT in the last 72 hours. Thyroid function studies Recent Labs    05/26/19 1432  TSH 1.027   Anemia work up No results for input(s): VITAMINB12, FOLATE, FERRITIN, TIBC, IRON, RETICCTPCT in the last 72  hours. Urinalysis  Component Value Date/Time   COLORURINE YELLOW (A) 05/26/2019 1432   APPEARANCEUR HAZY (A) 05/26/2019 1432   LABSPEC 1.011 05/26/2019 1432   PHURINE 6.0 05/26/2019 1432   GLUCOSEU NEGATIVE 05/26/2019 1432   HGBUR NEGATIVE 05/26/2019 1432   BILIRUBINUR NEGATIVE 05/26/2019 1432   KETONESUR NEGATIVE 05/26/2019 1432   PROTEINUR NEGATIVE 05/26/2019 1432   NITRITE NEGATIVE 05/26/2019 1432   LEUKOCYTESUR MODERATE (A) 05/26/2019 1432   Sepsis Labs Invalid input(s): PROCALCITONIN,  WBC,  LACTICIDVEN Microbiology Recent Results (from the past 240 hour(s))  Blood culture (routine x 2)     Status: None (Preliminary result)   Collection Time: 05/26/19  3:40 PM   Specimen: BLOOD  Result Value Ref Range Status   Specimen Description BLOOD  Final   Special Requests NONE  Final   Culture   Final    NO GROWTH 3 DAYS Performed at [facility], [address] [place], [place] [address]    Report Status PENDING  Incomplete  Respiratory Panel by RT PCR (Flu A&B, Covid) - Nasopharyngeal Swab     Status: None   Collection Time: 05/26/19  4:09 PM   Specimen: Nasopharyngeal Swab  Result Value Ref Range Status   SARS Coronavirus 2 by RT PCR NEGATIVE NEGATIVE Final    Comment: (NOTE) SARS-CoV-2 target nucleic acids are NOT DETECTED. The SARS-CoV-2 RNA is generally detectable in upper respiratoy specimens during the acute phase of infection. The lowest concentration of SARS-CoV-2 viral copies this assay can detect is 131 copies/mL. A negative result does not preclude SARS-Cov-2 infection and should not be used as the sole basis for treatment or other patient management decisions. A negative result may occur with  improper specimen collection/handling, submission of specimen other than nasopharyngeal swab, presence of viral mutation(s) within the areas targeted by this assay, and inadequate number of viral copies (<131 copies/mL). A negative result must be combined  with clinical observations, patient history, and epidemiological information. The expected result is Negative. Fact Sheet for Patients:  https://www.moore.com/ Fact Sheet for Healthcare Providers:  https://www.young.biz/ This test is not yet ap proved or cleared by the [place] FDA and  has been authorized for detection and/or diagnosis of SARS-CoV-2 by FDA under an Emergency Use Authorization (EUA). This EUA will remain  in effect (meaning this test can be used) for the duration of the COVID-19 declaration under Section 564(b)(1) of the Act, 21 U.S.C. section 360bbb-3(b)(1), unless the authorization is terminated or revoked sooner.    Influenza A by PCR NEGATIVE NEGATIVE Final   Influenza B by PCR NEGATIVE NEGATIVE Final    Comment: (NOTE) The Xpert Xpress SARS-CoV-2/FLU/RSV assay is intended as an aid in  the diagnosis of influenza from Nasopharyngeal swab specimens and  should not be used as a sole basis for treatment. Nasal washings and  aspirates are unacceptable for Xpert Xpress SARS-CoV-2/FLU/RSV  testing. Fact Sheet for Patients: https://www.moore.com/ Fact Sheet for Healthcare Providers: https://www.young.biz/ This test is not yet approved or cleared by the [place] FDA and  has been authorized for detection and/or diagnosis of SARS-CoV-2 by  FDA under an Emergency Use Authorization (EUA). This EUA will remain  in effect (meaning this test can be used) for the duration of the  Covid-19 declaration under Section 564(b)(1) of the Act, 21  U.S.C. section 360bbb-3(b)(1), unless the authorization is  terminated or revoked. Performed at [facility], [address] [place], [place] [address]   Blood culture (routine x 2)  Status: None (Preliminary result)   Collection Time: 05/26/19  7:45 PM   Specimen: BLOOD  Result Value Ref Range Status   Specimen Description BLOOD BLOOD  RIGHT HAND  Final   Special Requests   Final    BOTTLES DRAWN AEROBIC AND ANAEROBIC Blood Culture results may not be optimal due to an inadequate volume of blood received in culture bottles   Culture   Final    NO GROWTH 3 DAYS Performed at [facility], [address], [place],  [address]    Report Status PENDING  Incomplete     Time coordinating discharge: Over 30 minutes  SIGNED:   Nolberto Hanlon, MD  [facility] 05/29/2019, 11:35 AM Pager   If 7PM-7AM, please contact night-coverage [email or web address] Password [email or web address]

## 2019-05-29 NOTE — Progress Notes (Signed)
VS obtained , Report called to [place] blunt at [facility]

## 2019-05-29 NOTE — [place] FL2 (Signed)
[place] MEDICAID FL2 LEVEL OF CARE SCREENING TOOL     IDENTIFICATION  Patient Name: Kaitlyn Ruiz Birthdate: [birth year] Sex: female Admission Date (Current Location): 05/26/2019  [place] and [place] Number:  Chiropodist and Address:  [facility], [address], [place], [place] [address]      Provider Number: [id number]  Attending Physician Name and Address:  Lynn Ito, MD  Relative Name and Phone Number:  Windell Moment  [phone]    Current Level of Care: Hospital Recommended Level of Care: Skilled Nursing Facility Prior Approval Number:    Date Approved/Denied:   PASRR Number: [id number]  Discharge Plan: SNF    Current Diagnoses: Patient Active Problem List   Diagnosis Date Noted  . 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 Acute on chronic respiratory failure with hypoxia (HCC) 05/26/2019  . Urinary tract infection 05/26/2019  . 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 Non-insulin dependent type 2 diabetes mellitus (HCC) 05/26/2019  . Hyponatremia 05/26/2019  . UTI (urinary tract infection) 05/26/2019  . 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 Dysphagia as late effect of cerebrovascular accident (CVA) 05/26/2019  . Appetite impaired 08/15/2018  . Anorexia 06/05/2018  . Weakness generalized 06/05/2018  . Esophageal dysphagia   . Oropharyngeal dysphagia   . Neurological dysfunction   . Endotracheal tube present   . Palliative care encounter   . Acute respiratory failure with hypoxemia (HCC) 05/14/2018  . Acute metabolic encephalopathy   . Septic shock (HCC) 04/10/2017    Orientation RESPIRATION BLADDER Height & Weight     Self  O2([place] 2 L) Incontinent Weight: 72.6 kg Height:  5\' 6"  (167.6 cm)  BEHAVIORAL SYMPTOMS/MOOD NEUROLOGICAL BOWEL NUTRITION STATUS      Incontinent Feeding tube  AMBULATORY STATUS COMMUNICATION OF NEEDS Skin   Total Care Verbally  Normal                       Personal Care Assistance Level of Assistance  Bathing, Feeding, Dressing, Total care Bathing Assistance: Maximum assistance Feeding assistance: Maximum assistance Dressing Assistance: Maximum assistance Total Care Assistance: Maximum assistance   Functional Limitations Info             SPECIAL CARE FACTORS FREQUENCY                       Contractures Contractures Info: Not present    Additional Factors Info  Code Status, Allergies Code Status Info: Full Allergies Info: NKA           Current Medications (05/29/2019):  This is the current hospital active medication list Current Facility-Administered Medications  Medication Dose Route Frequency Provider Last Rate Last Admin  . aspirin chewable tablet 81 mg  81 mg Per Tube Daily 05/31/2019, MD   81 mg at 05/28/19 0836  . budesonide (PULMICORT) nebulizer solution 0.25 mg  0.25 mg Nebulization BID 05/30/19 V, MD   0.25 mg at 05/29/19 0750  . cefTRIAXone (ROCEPHIN) 1 g in sodium chloride 0.9 % 100 mL IVPB  1 g Intravenous Q24H 05/31/19 V, MD 200 mL/hr at 05/28/19 2015 1 g at 05/28/19 2015  . citalopram (CELEXA) tablet 10 mg  10 mg Oral Daily 05/30/19, MD   10 mg at 05/28/19 0835  . docusate (COLACE) 50 MG/5ML liquid 100 mg  100 mg Per Tube BID 05/30/19, MD   100 mg  at 05/28/19 2234  . enoxaparin (LOVENOX) injection 40 mg  40 mg Subcutaneous Q24H Athena Masse, MD   40 mg at 05/28/19 2235  . feeding supplement (JEVITY 1.5 CAL/FIBER) liquid 237 mL  237 mL Oral 5 X Daily Nolberto Hanlon, MD   237 mL at 05/29/19 0542  . gabapentin (NEURONTIN) 250 MG/5ML solution 300 mg  300 mg Per Tube Q8H Athena Masse, MD   300 mg at 05/29/19 0542  . insulin aspart (novoLOG) injection 0-15 Units  0-15 Units Subcutaneous Q4H Athena Masse, MD   2 Units at 05/29/19 0825  . ipratropium-albuterol (DUONEB) 0.5-2.5 (3) MG/3ML nebulizer solution 3 mL  3 mL Nebulization TID Athena Masse, MD   3 mL at 05/29/19 0750  . ipratropium-albuterol (DUONEB) 0.5-2.5 (3) MG/3ML nebulizer solution 3 mL  3 mL Nebulization Q4H PRN Judd Gaudier V, MD      . lisinopril (ZESTRIL) tablet 2.5 mg  2.5 mg Per Tube Daily Athena Masse, MD   2.5 mg at 05/28/19 0834  . magnesium hydroxide (MILK OF MAGNESIA) suspension 30 mL  30 mL Per Tube Daily PRN Athena Masse, MD      . MEDLINE mouth rinse  15 mL Mouth Rinse q12n4p Athena Masse, MD   15 mL at 05/28/19 1240  . polyvinyl alcohol (LIQUIFILM TEARS) 1.4 % ophthalmic solution 1 drop  1 drop Both Eyes Daily Athena Masse, MD   1 drop at 05/28/19 1114  . pravastatin (PRAVACHOL) tablet 10 mg  10 mg Oral q1800 Athena Masse, MD   10 mg at 05/27/19 1736  . valproic acid (DEPAKENE) 250 MG/5ML solution 125 mg  125 mg Per Tube BID Athena Masse, MD   125 mg at 05/28/19 2234     Discharge Medications: Please see discharge summary for a list of discharge medications.  Relevant Imaging Results:  Relevant Lab Results:   Additional Information SS# [id number]  Shelbie Hutching, RN

## 2019-05-29 NOTE — Progress Notes (Signed)
Patient transported by EMS to [facility] care center without incident.

## 2019-05-29 NOTE — Progress Notes (Signed)
Patient still waiting for EMS transport.

## 2019-05-29 NOTE — TOC Transition Note (Signed)
Transition of Care [facility]) - CM/SW Discharge Note   Patient Details  Name: [name] MRN: [id number] Date of Birth: 10-21-1955  Transition of Care [facility]) CM/SW Contact:  Shelbie Hutching, RN Phone Number: 05/29/2019, 11:34 AM   Clinical Narrative:    Patient will discharge back to [place] today.  Patient is a long term resident there.  Patient will transport via [facility] and bedside RN will call report to [phone].  Patient is going to room 79A.   RNCM attempted to call patient's sister and Uncle with no answer- message left for sister to return call.    Final next level of care: Skilled Nursing Facility Barriers to Discharge: Barriers Resolved   Patient Goals and CMS Choice        Discharge Placement              Patient chooses bed at: [facility] Patient to be transferred to facility by: [facility] Name of family member notified: Orene Desanctis- sister- left message for her to return call Patient and family notified of of transfer: 05/29/19  Discharge Plan and Services     Post Acute Care Choice: Resumption of Svcs/PTA Provider                               Social Determinants of Health (SDOH) Interventions     Readmission Risk Interventions No flowsheet data found.

## 2019-05-29 NOTE — Progress Notes (Signed)
Gtube is patent today , gravity bolus feeding accepted without difficulty , patient seems tired today , off-going RN reported patient was awake most of the night. Lungs Clear/diminished bilaterally , denies SOB . Productive cough noted with clear emesis. Afebrile. Will continue to monitor

## 2019-05-31 LAB — CULTURE, BLOOD (ROUTINE X 2)
Culture: NO GROWTH
Culture: NO GROWTH

## 2019-06-13 ENCOUNTER — Non-Acute Institutional Stay: Payer: Medicare Other | Admitting: Nurse Practitioner

## 2019-06-13 ENCOUNTER — Other Ambulatory Visit: Payer: Self-pay

## 2019-06-13 ENCOUNTER — Encounter: Payer: Self-pay | Admitting: Nurse Practitioner

## 2019-06-13 VITALS — BP 138/78 | HR 77 | Temp 97.2°F | Resp 18 | Wt 170.0 lb

## 2019-06-13 DIAGNOSIS — Z515 Encounter for palliative care: Secondary | ICD-10-CM

## 2019-06-13 DIAGNOSIS — J449 Chronic obstructive pulmonary disease, unspecified: Secondary | ICD-10-CM
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
## 2019-06-21 ENCOUNTER — Encounter: Payer: Self-pay | Admitting: *Deleted

## 2019-07-11 ENCOUNTER — Other Ambulatory Visit: Payer: Self-pay

## 2019-07-11 ENCOUNTER — Encounter: Payer: Self-pay | Admitting: Nurse Practitioner

## 2019-07-11 ENCOUNTER — Non-Acute Institutional Stay: Payer: Medicare Other | Admitting: Nurse Practitioner

## 2019-07-11 VITALS — BP 128/59 | HR 76 | Temp 97.0°F | Resp 18 | Wt 171.0 lb

## 2019-07-11 DIAGNOSIS — Z515 Encounter for palliative care: Secondary | ICD-10-CM

## 2019-07-11 DIAGNOSIS — J449 Chronic obstructive pulmonary disease, unspecified: Secondary | ICD-10-CM
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
## 2019-08-14 ENCOUNTER — Ambulatory Visit: Payer: Medicare Other | Admitting: Gastroenterology

## 2019-08-29 ENCOUNTER — Other Ambulatory Visit: Payer: Self-pay

## 2019-08-29 ENCOUNTER — Other Ambulatory Visit
Admission: RE | Admit: 2019-08-29 | Discharge: 2019-08-29 | Disposition: A | Discharge status: Home / Self Care | Payer: Medicare Other | Source: Ambulatory Visit | Attending: Gastroenterology | Admitting: Gastroenterology

## 2019-08-29 ENCOUNTER — Ambulatory Visit (INDEPENDENT_AMBULATORY_CARE_PROVIDER_SITE_OTHER): Payer: Medicare Other | Admitting: Gastroenterology

## 2019-08-29 VITALS — BP 118/78 | HR 79 | Temp 98.1°F | Ht 66.0 in

## 2019-08-29 DIAGNOSIS — Z20822 Contact with and (suspected) exposure to covid-19: Secondary | ICD-10-CM | POA: Insufficient documentation

## 2019-08-29 DIAGNOSIS — K9423 Gastrostomy malfunction: Secondary | ICD-10-CM

## 2019-08-29 DIAGNOSIS — Z01812 Encounter for preprocedural laboratory examination: Secondary | ICD-10-CM | POA: Insufficient documentation

## 2019-08-29 LAB — SARS CORONAVIRUS 2 (TAT 6-24 HRS): SARS Coronavirus 2: NEGATIVE
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
## 2019-09-03 ENCOUNTER — Encounter
Admission: RE | Disposition: A | Discharge status: Skilled Nursing Facility | Payer: Self-pay | Source: Ambulatory Visit | Attending: Gastroenterology

## 2019-09-03 ENCOUNTER — Ambulatory Visit
Admission: RE | Admit: 2019-09-03 | Discharge: 2019-09-03 | Disposition: A | Discharge status: Skilled Nursing Facility | Payer: Medicare Other | Source: Ambulatory Visit | Attending: Gastroenterology | Admitting: Gastroenterology

## 2019-09-03 DIAGNOSIS — Z431 Encounter for attention to gastrostomy: Secondary | ICD-10-CM | POA: Insufficient documentation

## 2019-09-03 HISTORY — PX: PEG PLACEMENT: SHX5437

## 2019-09-03 SURGERY — REPLACEMENT, PEG TUBE, WITHOUT ENDOSCOPY

## 2019-09-03 NOTE — H&P (Signed)
GI note:  She is here today for a bedside PEG tube change.  Old tube was discolored and looked very old.  Many kinks in the tube.  At the bedside the old tube was pulled out tract looked clean no bleeding.  18 French balloon replacement tube was inserted and the balloon inflated with 6 cc of saline.  Patient appeared comfortable discharge.   Dr Wyline Mood MD,MRCP [facility]) Gastroenterology/Hepatology Pager: [phone]

## 2019-09-04 ENCOUNTER — Encounter: Payer: Self-pay | Admitting: *Deleted

## 2019-11-28 ENCOUNTER — Other Ambulatory Visit: Payer: Self-pay

## 2019-11-28 ENCOUNTER — Non-Acute Institutional Stay: Payer: Medicare Other | Admitting: Nurse Practitioner

## 2019-11-28 VITALS — BP 131/84 | HR 78 | Wt 187.0 lb

## 2019-11-28 DIAGNOSIS — Z515 Encounter for palliative care: Secondary | ICD-10-CM

## 2019-11-28 DIAGNOSIS — J449 Chronic obstructive pulmonary disease, unspecified: Secondary | ICD-10-CM
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
## 2020-02-29 ENCOUNTER — Encounter: Payer: Self-pay | Admitting: Nurse Practitioner

## 2020-02-29 ENCOUNTER — Non-Acute Institutional Stay: Payer: Medicare Other | Admitting: Nurse Practitioner

## 2020-02-29 ENCOUNTER — Other Ambulatory Visit: Payer: Self-pay

## 2020-02-29 VITALS — BP 115/62 | Wt 167.0 lb

## 2020-02-29 DIAGNOSIS — Z515 Encounter for palliative care: Secondary | ICD-10-CM

## 2020-02-29 DIAGNOSIS — J449 Chronic obstructive pulmonary disease, unspecified: Secondary | ICD-10-CM
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
## 2020-04-01 IMAGING — CT CT HEAD W/O CM
3 series · 15 of 45 positions shown, 18 images · non-contrast
Comparison: None.

CLINICAL DATA: Altered level of consciousness.

EXAM:
CT HEAD WITHOUT CONTRAST
TECHNIQUE: Contiguous axial images were obtained from the base of the skull
through the vertex without intravenous contrast.

[Series 2: head wo · axial · 0.42mm/px · z∈[-90,+25]mm · 9 of 28 slices shown, 12 images]
[im 3/28  brain]
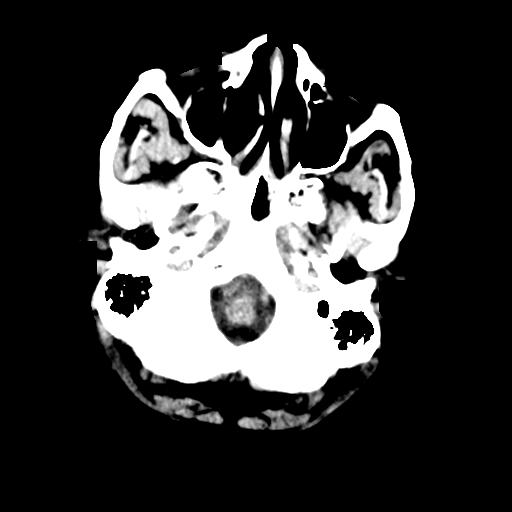
[im 3/28  bone]
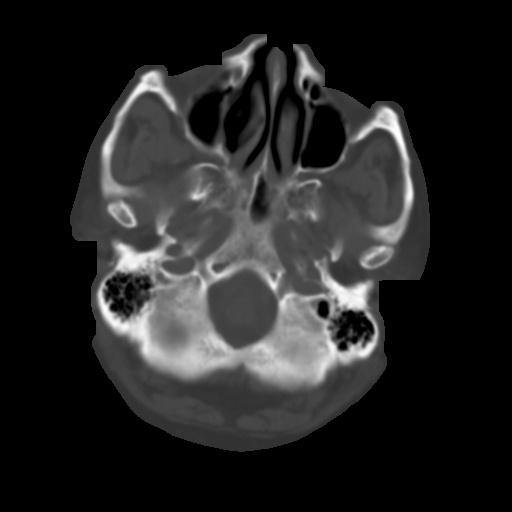
[im 6/28  brain]
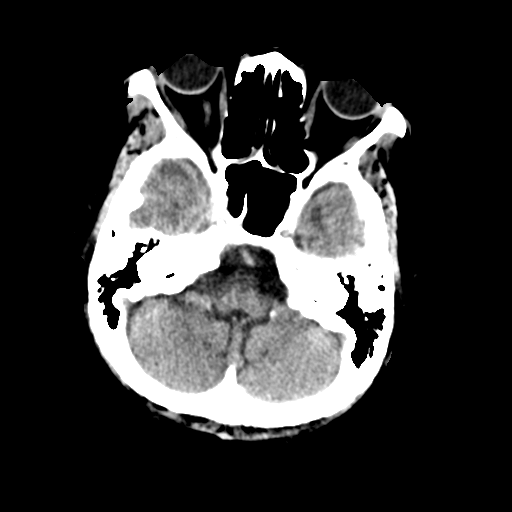
[im 9/28  brain]
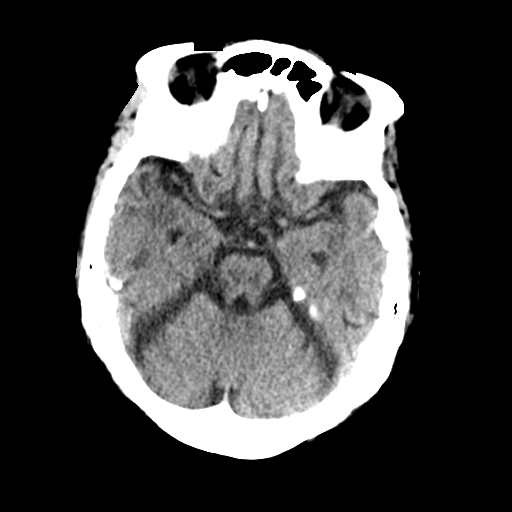
[im 12/28  brain]
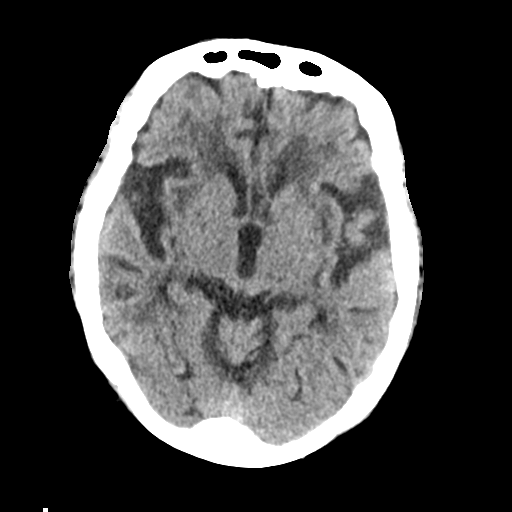
[im 15/28  brain]
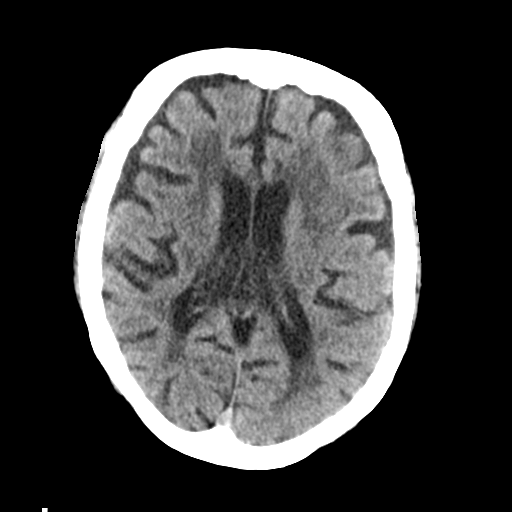
[im 15/28  bone]
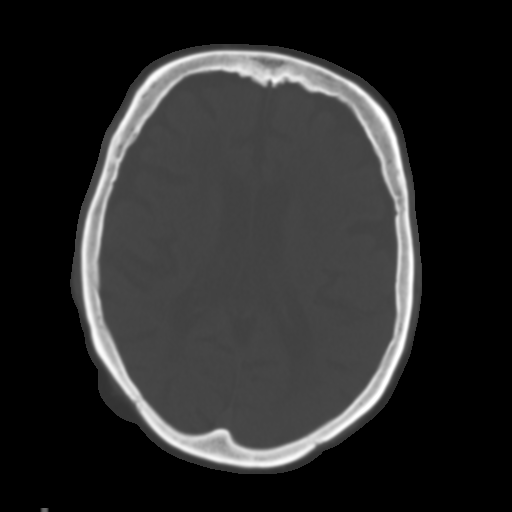
[im 17/28  brain]
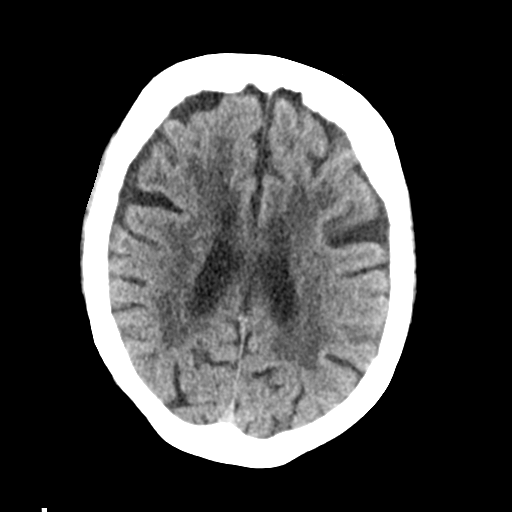
[im 20/28  brain]
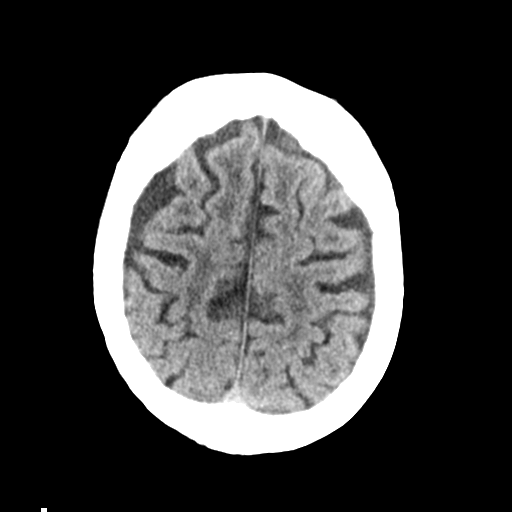
[im 23/28  brain]
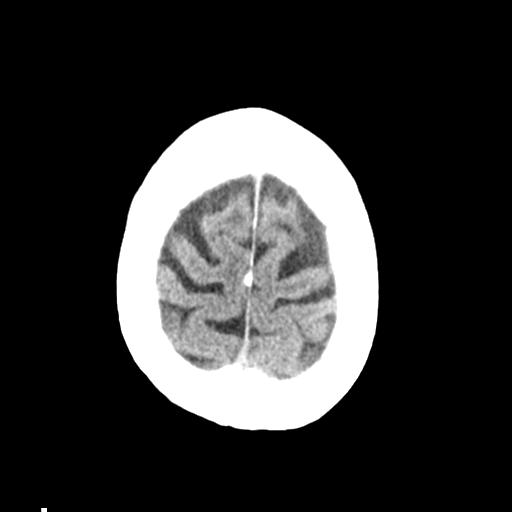
[im 26/28  brain]
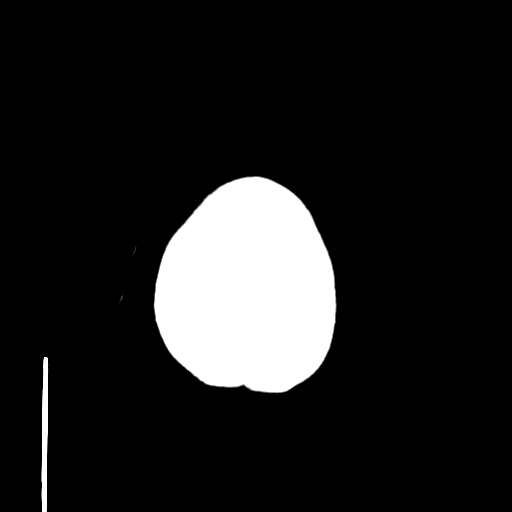
[im 26/28  bone]
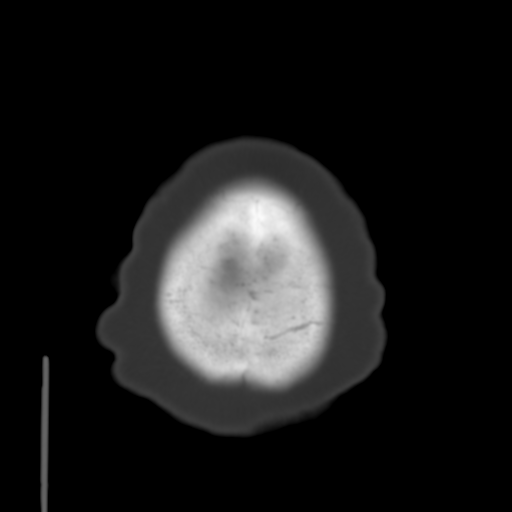

[Series 4: coronal soft tissue · coronal · 0.28mm/px · 3 of 62 slices shown]
[im 21/62  brain]
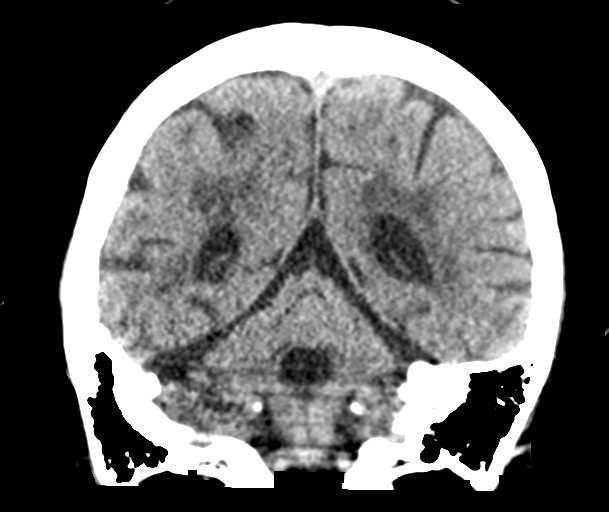
[im 28/62  brain]
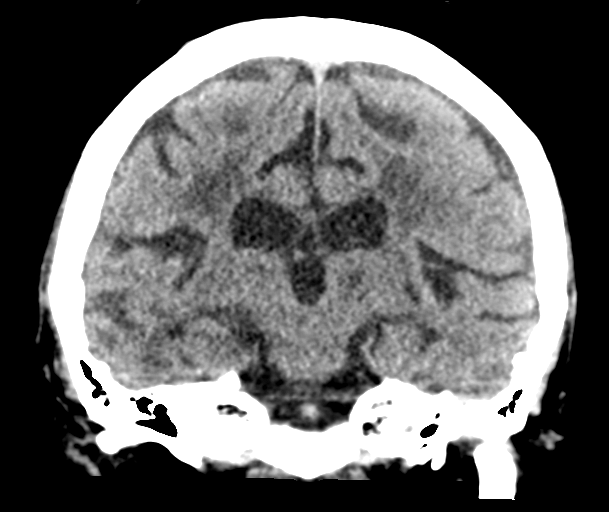
[im 34/62  brain]
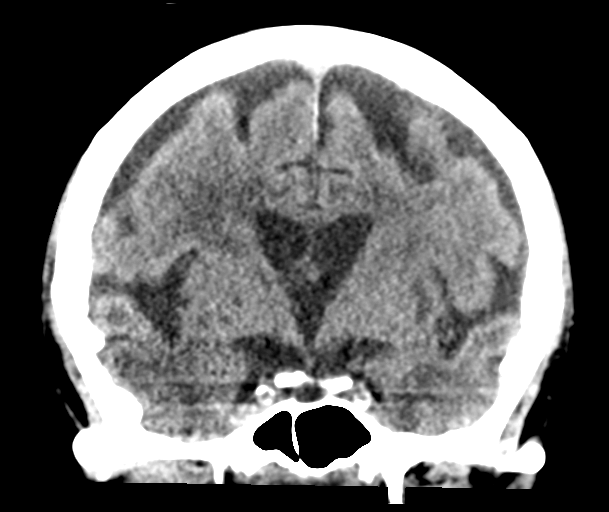

[Series 5: sagittal soft tissue · sagittal · 0.28mm/px · 3 of 52 slices shown]
[im 18/52  brain]
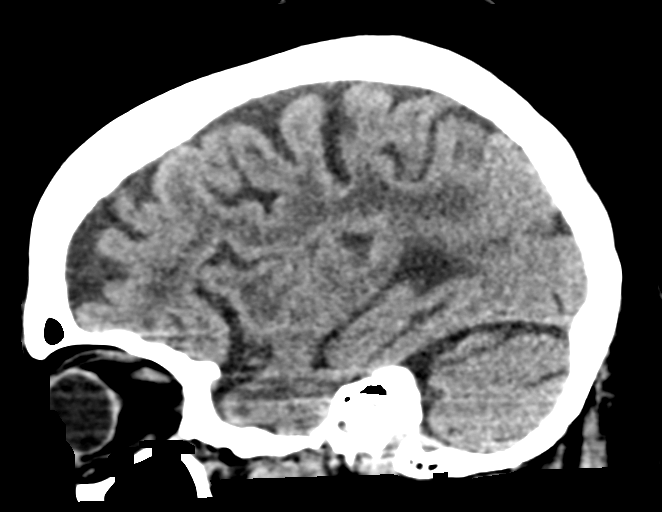
[im 26/52  brain]
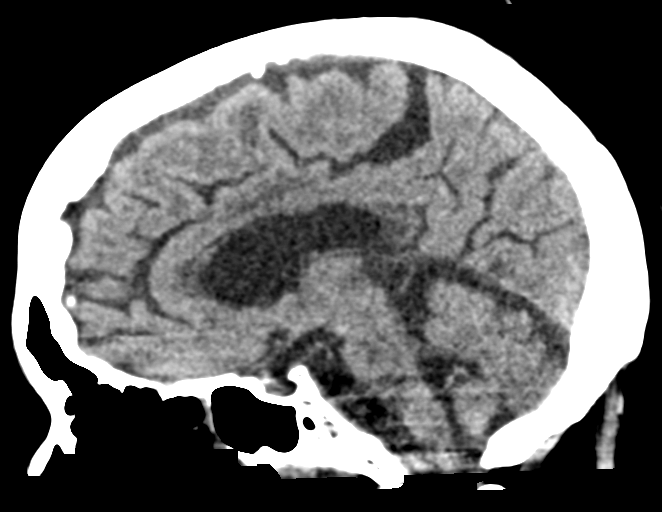
[im 35/52  brain]
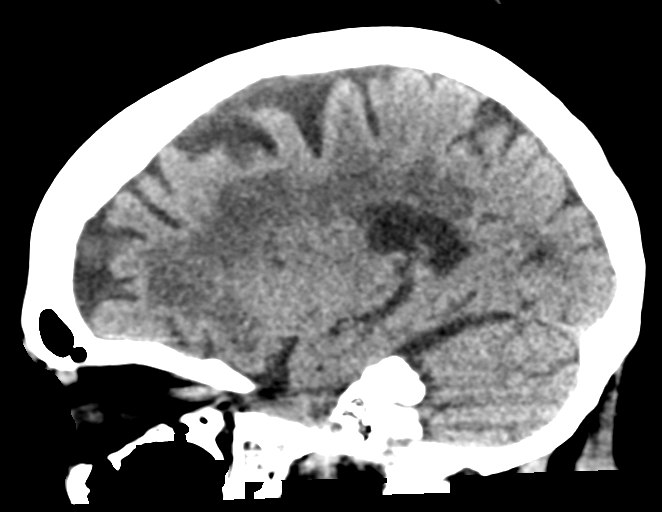

[15 of 45 positions shown; findings below may reference images not displayed]

FINDINGS: Brain: Generalized atrophy, advanced for age. Moderate to advanced
chronic small vessel ischemia. Tiny remote lacunar infarcts in the
bilateral basal ganglia. No evidence of cerebral edema, gray-white
differentiation is preserved. No intracranial hemorrhage, mass
effect, or midline shift. No hydrocephalus. The basilar cisterns are
patent. No evidence of territorial infarct or acute ischemia. No
extra-axial or intracranial fluid collection.

Vascular: No hyperdense vessel.

Skull: No fracture or focal lesion.

Sinuses/Orbits: Minimal mucosal thickening of the ethmoid air cells
may be related to intubation. No sinus fluid level. Mastoid air
cells are clear. Visualized orbits are unremarkable.

Other: None.
IMPRESSION: 1. No acute intracranial abnormality.
2. Generalized atrophy and chronic small vessel ischemia, advanced
for age.

## 2020-06-11 ENCOUNTER — Encounter: Payer: Self-pay | Admitting: Nurse Practitioner

## 2020-06-11 ENCOUNTER — Non-Acute Institutional Stay: Payer: Medicare Other | Admitting: Nurse Practitioner

## 2020-06-11 ENCOUNTER — Other Ambulatory Visit: Payer: Self-pay

## 2020-06-11 VITALS — BP 118/56 | HR 72 | Resp 18 | Wt 157.2 lb

## 2020-06-11 DIAGNOSIS — J449 Chronic obstructive pulmonary disease, unspecified: Secondary | ICD-10-CM

## 2020-06-11 DIAGNOSIS — Z515 Encounter for palliative care: Secondary | ICD-10-CM
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
## 2020-07-16 ENCOUNTER — Encounter: Payer: Self-pay | Admitting: Nurse Practitioner

## 2020-07-16 ENCOUNTER — Non-Acute Institutional Stay: Payer: Medicare Other | Admitting: Nurse Practitioner

## 2020-07-16 DIAGNOSIS — J449 Chronic obstructive pulmonary disease, unspecified: Secondary | ICD-10-CM

## 2020-07-16 DIAGNOSIS — Z515 Encounter for palliative care: Secondary | ICD-10-CM
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
## 2020-07-17 ENCOUNTER — Other Ambulatory Visit: Payer: Self-pay

## 2020-09-01 ENCOUNTER — Non-Acute Institutional Stay: Payer: Medicare Other | Admitting: Nurse Practitioner

## 2020-09-01 ENCOUNTER — Other Ambulatory Visit: Payer: Self-pay

## 2020-09-01 ENCOUNTER — Encounter: Payer: Self-pay | Admitting: Nurse Practitioner

## 2020-09-01 VITALS — BP 118/64 | HR 80 | Temp 97.9°F | Resp 18 | Wt 156.8 lb

## 2020-09-01 DIAGNOSIS — Z515 Encounter for palliative care: Secondary | ICD-10-CM

## 2020-09-01 DIAGNOSIS — Z931 Gastrostomy status: Secondary | ICD-10-CM

## 2020-09-01 DIAGNOSIS — R531 Weakness: Secondary | ICD-10-CM

## 2020-09-01 DIAGNOSIS — J449 Chronic obstructive pulmonary disease, unspecified: Secondary | ICD-10-CM
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
## 2020-11-28 ENCOUNTER — Other Ambulatory Visit: Payer: Self-pay

## 2020-11-28 ENCOUNTER — Emergency Department: Payer: Medicare Other

## 2020-11-28 DIAGNOSIS — Z87891 Personal history of nicotine dependence: Secondary | ICD-10-CM | POA: Diagnosis not present

## 2020-11-28 DIAGNOSIS — K9423 Gastrostomy malfunction: Secondary | ICD-10-CM | POA: Diagnosis present

## 2020-11-28 DIAGNOSIS — J449 Chronic obstructive pulmonary disease, unspecified: Secondary | ICD-10-CM | POA: Diagnosis not present

## 2020-11-28 DIAGNOSIS — Z7951 Long term (current) use of inhaled steroids: Secondary | ICD-10-CM | POA: Diagnosis not present

## 2020-11-28 DIAGNOSIS — Z79899 Other long term (current) drug therapy: Secondary | ICD-10-CM | POA: Insufficient documentation

## 2020-11-28 DIAGNOSIS — Z7982 Long term (current) use of aspirin: Secondary | ICD-10-CM | POA: Insufficient documentation

## 2020-11-28 DIAGNOSIS — I10 Essential (primary) hypertension: Secondary | ICD-10-CM | POA: Diagnosis not present
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
## 2020-11-28 NOTE — ED Triage Notes (Signed)
Pt presents via EMS c/o dislodgement of G tube from [employer]. Pt has size 65F G tube per hospital records. No other complaints from facility.

## 2020-11-28 NOTE — Discharge Instructions (Signed)
Your 96 French G-tube has been replaced.  Return to the ER for worsening symptoms, persistent vomiting, dislodged tube or other concerns.

## 2020-11-29 ENCOUNTER — Emergency Department
Admission: EM | Admit: 2020-11-29 | Discharge: 2020-11-29 | Disposition: A | Discharge status: Home / Self Care | Payer: Medicare Other | Attending: Emergency Medicine | Admitting: Emergency Medicine

## 2020-11-29 DIAGNOSIS — K9423 Gastrostomy malfunction: Secondary | ICD-10-CM

## 2020-11-29 MED ORDER — DIATRIZOATE MEGLUMINE & SODIUM 66-10 % PO SOLN
30.0000 mL | Freq: Once | ORAL | Status: AC
  Administered 2020-11-29: 30 mL

## 2020-11-29 NOTE — ED Notes (Signed)
[facility] to transport to [facility]. 

## 2020-12-08 ENCOUNTER — Encounter: Payer: Self-pay | Admitting: Nurse Practitioner

## 2020-12-08 ENCOUNTER — Non-Acute Institutional Stay: Payer: Medicare Other | Admitting: Nurse Practitioner

## 2020-12-08 ENCOUNTER — Other Ambulatory Visit: Payer: Self-pay

## 2020-12-08 VITALS — BP 132/64 | HR 75 | Temp 97.0°F | Resp 18 | Wt 148.0 lb

## 2020-12-08 DIAGNOSIS — Z515 Encounter for palliative care: Secondary | ICD-10-CM

## 2020-12-08 DIAGNOSIS — R0602 Shortness of breath: Secondary | ICD-10-CM
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
## 2021-01-04 ENCOUNTER — Emergency Department
Admission: EM | Admit: 2021-01-04 | Discharge: 2021-01-04 | Disposition: A | Discharge status: Home / Self Care | Payer: Medicare Other | Attending: Emergency Medicine | Admitting: Emergency Medicine

## 2021-01-04 ENCOUNTER — Other Ambulatory Visit: Payer: Self-pay

## 2021-01-04 ENCOUNTER — Emergency Department: Payer: Medicare Other

## 2021-01-04 DIAGNOSIS — J449 Chronic obstructive pulmonary disease, unspecified: Secondary | ICD-10-CM | POA: Diagnosis not present

## 2021-01-04 DIAGNOSIS — T85528A Displacement of other gastrointestinal prosthetic devices, implants and grafts, initial encounter: Secondary | ICD-10-CM

## 2021-01-04 DIAGNOSIS — E119 Type 2 diabetes mellitus without complications: Secondary | ICD-10-CM | POA: Diagnosis not present

## 2021-01-04 DIAGNOSIS — Z79899 Other long term (current) drug therapy: Secondary | ICD-10-CM | POA: Insufficient documentation

## 2021-01-04 DIAGNOSIS — I10 Essential (primary) hypertension: Secondary | ICD-10-CM | POA: Diagnosis not present

## 2021-01-04 DIAGNOSIS — Z87891 Personal history of nicotine dependence: Secondary | ICD-10-CM | POA: Insufficient documentation

## 2021-01-04 DIAGNOSIS — Z7984 Long term (current) use of oral hypoglycemic drugs: Secondary | ICD-10-CM | POA: Insufficient documentation

## 2021-01-04 DIAGNOSIS — Z7982 Long term (current) use of aspirin: Secondary | ICD-10-CM | POA: Insufficient documentation

## 2021-01-04 DIAGNOSIS — K9423 Gastrostomy malfunction: Secondary | ICD-10-CM | POA: Diagnosis present

## 2021-01-04 MED ORDER — DIATRIZOATE MEGLUMINE & SODIUM 66-10 % PO SOLN
30.0000 mL | Freq: Once | ORAL | Status: AC
  Administered 2021-01-04: 30 mL

## 2021-01-04 NOTE — ED Triage Notes (Addendum)
Patient presents to the ED via EMS from [facility] for complaints of G tube dislodged. G tube out for unknown amount of time. Patient arrives to ED with G tube. Abdominal opening covered by dressing on arrival to ED. Patient alert, oriented to baseline. Patient denies pain or other complaints.

## 2021-01-04 NOTE — ED Notes (Signed)
Patient transported to X-ray 

## 2021-01-04 NOTE — ED Notes (Signed)
Just spoke with Derrill Kay, RN over at [facility]. Able to give phone report on pt returning to facility.
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
## 2021-01-04 NOTE — ED Notes (Signed)
Pt left with [facility], stable at transport

## 2021-01-04 NOTE — ED Notes (Signed)
Patient verbalizes understanding of discharge instructions. Opportunity for questioning and answers were provided. Armband removed by staff, EMS called for transport back to [facility].

## 2021-01-04 NOTE — ED Notes (Signed)
Pt's legs adjusted for comfort.

## 2021-01-04 NOTE — ED Notes (Signed)
Attempted to call [facility] facility for report on pt's return, no answer.

## 2021-01-04 NOTE — ED Notes (Signed)
ED Provider at bedside. 

## 2021-01-21 ENCOUNTER — Non-Acute Institutional Stay: Payer: Medicare Other | Admitting: Nurse Practitioner

## 2021-01-21 ENCOUNTER — Encounter: Payer: Self-pay | Admitting: Nurse Practitioner

## 2021-01-21 VITALS — BP 142/62 | HR 88 | Temp 97.0°F | Resp 18 | Wt 144.0 lb

## 2021-01-21 DIAGNOSIS — J449 Chronic obstructive pulmonary disease, unspecified: Secondary | ICD-10-CM

## 2021-01-21 DIAGNOSIS — R0602 Shortness of breath: Secondary | ICD-10-CM

## 2021-01-21 DIAGNOSIS — Z515 Encounter for palliative care: Secondary | ICD-10-CM
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
## 2021-01-22 ENCOUNTER — Other Ambulatory Visit: Payer: Self-pay

## 2021-02-20 ENCOUNTER — Non-Acute Institutional Stay: Payer: Medicare Other | Admitting: Nurse Practitioner

## 2021-02-20 ENCOUNTER — Encounter: Payer: Self-pay | Admitting: Nurse Practitioner

## 2021-02-20 ENCOUNTER — Other Ambulatory Visit: Payer: Self-pay

## 2021-02-20 VITALS — BP 118/78 | HR 78 | Temp 98.0°F | Resp 18 | Wt 142.2 lb

## 2021-02-20 DIAGNOSIS — R0602 Shortness of breath: Secondary | ICD-10-CM

## 2021-02-20 DIAGNOSIS — Z515 Encounter for palliative care: Secondary | ICD-10-CM

## 2021-02-20 DIAGNOSIS — J449 Chronic obstructive pulmonary disease, unspecified: Secondary | ICD-10-CM
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
## 2021-02-28 ENCOUNTER — Other Ambulatory Visit: Payer: Self-pay

## 2021-02-28 ENCOUNTER — Emergency Department
Admission: EM | Admit: 2021-02-28 | Discharge: 2021-02-28 | Disposition: A | Discharge status: Home / Self Care | Payer: Medicare Other | Attending: Emergency Medicine | Admitting: Emergency Medicine

## 2021-02-28 DIAGNOSIS — K9423 Gastrostomy malfunction: Secondary | ICD-10-CM | POA: Insufficient documentation

## 2021-02-28 DIAGNOSIS — Z7951 Long term (current) use of inhaled steroids: Secondary | ICD-10-CM | POA: Insufficient documentation

## 2021-02-28 DIAGNOSIS — Z7982 Long term (current) use of aspirin: Secondary | ICD-10-CM | POA: Insufficient documentation

## 2021-02-28 DIAGNOSIS — Z79899 Other long term (current) drug therapy: Secondary | ICD-10-CM | POA: Diagnosis not present

## 2021-02-28 DIAGNOSIS — Z87891 Personal history of nicotine dependence: Secondary | ICD-10-CM | POA: Insufficient documentation

## 2021-02-28 DIAGNOSIS — Z7984 Long term (current) use of oral hypoglycemic drugs: Secondary | ICD-10-CM | POA: Diagnosis not present

## 2021-02-28 DIAGNOSIS — E119 Type 2 diabetes mellitus without complications: Secondary | ICD-10-CM | POA: Insufficient documentation

## 2021-02-28 DIAGNOSIS — J449 Chronic obstructive pulmonary disease, unspecified: Secondary | ICD-10-CM | POA: Insufficient documentation

## 2021-02-28 DIAGNOSIS — T85528A Displacement of other gastrointestinal prosthetic devices, implants and grafts, initial encounter: Secondary | ICD-10-CM

## 2021-02-28 DIAGNOSIS — B379 Candidiasis, unspecified: Secondary | ICD-10-CM | POA: Insufficient documentation

## 2021-02-28 DIAGNOSIS — B372 Candidiasis of skin and nail: Secondary | ICD-10-CM

## 2021-02-28 DIAGNOSIS — I10 Essential (primary) hypertension: Secondary | ICD-10-CM | POA: Diagnosis not present

## 2021-02-28 MED ORDER — NYSTATIN 100000 UNIT/GM EX OINT: 1.0000 "application " | TOPICAL_OINTMENT | Freq: Two times a day (BID) | CUTANEOUS | 0 refills | Status: AC
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
## 2021-02-28 NOTE — Discharge Instructions (Addendum)
We placed a new 18 French PEG tube.  There is some candidal skin infection around the ostomy site.  Apply nystatin to this area twice a day for the next 2 weeks.

## 2021-02-28 NOTE — ED Triage Notes (Signed)
Pt coming from skilled nursing facility because G-tube has become dislodged.

## 2021-02-28 NOTE — ED Notes (Signed)
Peri care performed at this time.

## 2021-03-23 ENCOUNTER — Other Ambulatory Visit: Payer: Self-pay

## 2021-03-23 ENCOUNTER — Non-Acute Institutional Stay: Payer: Medicare Other | Admitting: Nurse Practitioner

## 2021-03-23 ENCOUNTER — Encounter: Payer: Self-pay | Admitting: Nurse Practitioner

## 2021-03-23 VITALS — BP 140/72 | HR 78 | Temp 97.6°F | Resp 18 | Wt 143.4 lb

## 2021-03-23 DIAGNOSIS — R0602 Shortness of breath: Secondary | ICD-10-CM

## 2021-03-23 DIAGNOSIS — J449 Chronic obstructive pulmonary disease, unspecified: Secondary | ICD-10-CM

## 2021-03-23 DIAGNOSIS — Z515 Encounter for palliative care: Secondary | ICD-10-CM
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
## 2021-06-12 ENCOUNTER — Encounter: Payer: Self-pay | Admitting: Nurse Practitioner

## 2021-06-12 ENCOUNTER — Non-Acute Institutional Stay: Payer: Commercial Managed Care - HMO | Admitting: Nurse Practitioner

## 2021-06-12 VITALS — BP 118/70 | HR 86 | Temp 97.0°F | Resp 18 | Wt 138.0 lb

## 2021-06-12 DIAGNOSIS — Z515 Encounter for palliative care: Secondary | ICD-10-CM

## 2021-06-12 DIAGNOSIS — R0602 Shortness of breath: Secondary | ICD-10-CM

## 2021-06-12 DIAGNOSIS — J449 Chronic obstructive pulmonary disease, unspecified: Secondary | ICD-10-CM
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
## 2021-06-15 ENCOUNTER — Other Ambulatory Visit: Payer: Self-pay

## 2021-06-29 ENCOUNTER — Other Ambulatory Visit: Payer: Self-pay

## 2021-06-29 ENCOUNTER — Non-Acute Institutional Stay: Payer: Commercial Managed Care - HMO | Admitting: Nurse Practitioner

## 2021-06-29 ENCOUNTER — Encounter: Payer: Self-pay | Admitting: Nurse Practitioner

## 2021-06-29 VITALS — BP 121/68 | HR 86 | Temp 97.0°F | Resp 18 | Wt 138.0 lb

## 2021-06-29 DIAGNOSIS — R0602 Shortness of breath: Secondary | ICD-10-CM

## 2021-06-29 DIAGNOSIS — Z515 Encounter for palliative care: Secondary | ICD-10-CM

## 2021-06-29 DIAGNOSIS — J449 Chronic obstructive pulmonary disease, unspecified: Secondary | ICD-10-CM
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
## 2021-07-03 DIAGNOSIS — K9423 Gastrostomy malfunction: Secondary | ICD-10-CM | POA: Insufficient documentation

## 2021-07-03 DIAGNOSIS — Z20822 Contact with and (suspected) exposure to covid-19: Secondary | ICD-10-CM | POA: Diagnosis not present

## 2021-07-03 NOTE — ED Notes (Signed)
Per ems pt from [facility] with removed g tube. Unknown size per ems.

## 2021-07-04 ENCOUNTER — Emergency Department
Admission: EM | Admit: 2021-07-04 | Discharge: 2021-07-04 | Disposition: A | Discharge status: Home / Self Care | Payer: Commercial Managed Care - HMO | Attending: Emergency Medicine | Admitting: Emergency Medicine

## 2021-07-04 ENCOUNTER — Emergency Department: Payer: Commercial Managed Care - HMO

## 2021-07-04 DIAGNOSIS — T85528A Displacement of other gastrointestinal prosthetic devices, implants and grafts, initial encounter: Secondary | ICD-10-CM

## 2021-07-04 HISTORY — PX: IR REPLC GASTRO/COLONIC TUBE PERCUT W/FLUORO: IMG2333

## 2021-07-04 LAB — RESP PANEL BY RT-PCR (FLU A&B, COVID) ARPGX2
Influenza A by PCR: NEGATIVE
Influenza B by PCR: NEGATIVE
SARS Coronavirus 2 by RT PCR: NEGATIVE

## 2021-07-04 MED ORDER — FENTANYL CITRATE PF 50 MCG/ML IJ SOSY
PREFILLED_SYRINGE | INTRAMUSCULAR | Status: AC
  Filled 2021-07-04: qty 1

## 2021-07-04 MED ORDER — MIDAZOLAM HCL 5 MG/5ML IJ SOLN
INTRAMUSCULAR | Status: AC
  Filled 2021-07-04: qty 5

## 2021-07-04 MED ORDER — IODIXANOL 320 MG/ML IV SOLN
50.0000 mL | Freq: Once | INTRAVENOUS | Status: AC
  Administered 2021-07-04: 30 mL

## 2021-07-04 NOTE — ED Notes (Signed)
Pt back from IR. No sedation per RN. No complications. Pt resting in bed NAD.

## 2021-07-04 NOTE — ED Provider Notes (Signed)
° °  [facility] Provider Note    Event Date/Time   First MD Initiated Contact with Patient 07/04/21 0732     (approximate)   History   Post-op Problem   HPI  Kaitlyn Ruiz is a 66 y.o. female with prior CVA who presents for G-tube replacement.  Patient was seen overnight by the overnight doctor and was unknown at that time how long the tube had been out for but it was it difficulty to even pass a sterile Q-tip in therefore plan was to talk to IR this AM.    Physical Exam   Triage Vital Signs: ED Triage Vitals  Enc Vitals Group     BP 07/04/21 0003 107/74     Pulse Rate 07/04/21 0003 66     Resp 07/04/21 0003 18     Temp 07/04/21 0003 98.8 F (37.1 C)     Temp Source 07/04/21 0003 Oral     SpO2 07/04/21 0003 99 %     Weight 07/04/21 0004 136 lb 11 oz (62 kg)     Height 07/04/21 0004 5\' 6"  (1.676 m)     Head Circumference --      Peak Flow --      Pain Score 07/04/21 0004 10     Pain Loc --      Pain Edu? --      Excl. in GC? --     Most recent vital signs: Vitals:   07/04/21 0003 07/04/21 0544  BP: 107/74 124/68  Pulse: 66 66  Resp: 18 18  Temp: 98.8 F (37.1 C)   SpO2: 99% 100%     General: Awake, no distress. Pt difficult to understand due to prior stroke  CV:  Good peripheral perfusion.  Resp:  Normal effort. Abd:  No distention. G tube hole noted with some crusting on it. Other:     ED Results / Procedures / Treatments   Labs (all labs ordered are listed, but only abnormal results are displayed) Labs Reviewed - No data to display    IMPRESSION / MDM / ASSESSMENT AND PLAN / ED COURSE  I reviewed the triage vital signs and the nursing notes.                              Differential diagnosis includes, but is not limited to, issue with her G-tube.  I initially contacted the IR team Dr. 09/01/21 given the overnight providers had difficulty with placing it.  However he had stated that he would have to let Kaitlyn Ruiz know.  I reviewed  the records and was placed back in October and it was a 7 French tube at that time they were able to place it in with a little bit of resistance at the gastric margin which they were able to overcome with a little bit of continuous pressure.  IR placed tube.  I did call patient's daughter to update her and she is aware.  Given the tube was placed back and will discharge back to facility     FINAL CLINICAL IMPRESSION(S) / ED DIAGNOSES   Final diagnoses:  Dislodged gastrostomy tube     Rx / DC Orders   ED Discharge Orders     None        Note:  This document was prepared using Dragon voice recognition software and may include unintentional dictation errors.   15, MD 07/04/21 [phone]

## 2021-07-04 NOTE — ED Notes (Signed)
Called [facility] for transport to [facility] [place]  [phone]

## 2021-07-04 NOTE — ED Provider Triage Note (Signed)
Emergency Medicine Provider Triage Evaluation Note  Kaitlyn Ruiz , a 66 y.o. female  was evaluated in triage.  Pt complains of dislodged G-tube.  Patient's history is limited due to prior CVA and relatively limited ability to communicate although she is able to answer some questions and seems understand what I am saying better than she can express herself.  Unknown time of dislodgment.   Review of Systems  Positive: Dislodged G-tube Negative: Pain, patient reports she is hungry.  Physical Exam  BP 107/74    Pulse 66    Temp 98.8 F (37.1 C) (Oral)    Resp 18    Ht 1.676 m (5\' 6" )    Wt 62 kg    SpO2 99%    BMI 22.06 kg/m  Gen:   Awake, no distress   Resp:  Normal effort  MSK:   Moves extremities without difficulty  Other:  G-tube stoma, generally well-appearing with no surrounding infection but with some degree of hypertrophy of tissue.  No evidence of acute infection.  There is some bloody discharge that is dried but no active expression of fluid  Medical Decision Making  Medically screening exam initiated at 12:38 AM.  Appropriate orders placed.  was informed that the remainder of the evaluation will be completed by another provider, this initial triage assessment does not replace that evaluation, and the importance of remaining in the ED until their evaluation is complete.  After receiving verbal consent from the patient, I attempted to replace dislodged G-tube with an 75 French tube, which her medical record indicates that was used previously.  However, I first attempted to establish that there is a tract, and I was unable to even pass a sterile swab through the stoma.  Even with firm pressure, the stoma was not patent, and it caused the patient a great deal of discomfort.  Given the risks to the patient of pain and possible perforation or complication, the tube will not be able to be replaced from triage and will need additional evaluation, possibly IR intervention.    15, MD 07/04/21 [phone]

## 2021-07-04 NOTE — ED Notes (Signed)
Pt updated again on wait.

## 2021-07-04 NOTE — Procedures (Signed)
Interventional Radiology Procedure Note  Procedure: G tube re-insertion  Indication: Dislodged G tube  Findings: Please refer to procedural dictation for full description.  Complications: None  EBL: < 10 mL  Anhar Mcdermott, MD [phone]   

## 2021-07-04 NOTE — ED Notes (Signed)
Pt changed into new brief.  

## 2021-07-04 NOTE — ED Notes (Signed)
Pt unsure when tube came out. Unable to note a hole for tube. Pt able to speak in slow, broken sentences and answer yes and no questions at baseline. Denies further needs.

## 2021-07-04 NOTE — Discharge Instructions (Signed)
IR replaced the G-tube.  Return to the ER if there is any issues with it

## 2021-07-04 NOTE — ED Notes (Signed)
Pt updated on care plan, warm blanket provided.

## 2021-07-04 NOTE — ED Notes (Signed)
Report to IR RN.

## 2021-08-03 ENCOUNTER — Observation Stay
Admission: EM | Admit: 2021-08-03 | Discharge: 2021-08-04 | Disposition: A | Discharge status: Skilled Nursing Facility | Payer: Medicare Other | Attending: Obstetrics and Gynecology | Admitting: Obstetrics and Gynecology

## 2021-08-03 ENCOUNTER — Other Ambulatory Visit: Payer: Self-pay

## 2021-08-03 ENCOUNTER — Encounter: Payer: Self-pay | Admitting: Emergency Medicine

## 2021-08-03 DIAGNOSIS — Y92129 Unspecified place in nursing home as the place of occurrence of the external cause: Secondary | ICD-10-CM | POA: Diagnosis not present

## 2021-08-03 DIAGNOSIS — J449 Chronic obstructive pulmonary disease, unspecified: Secondary | ICD-10-CM | POA: Insufficient documentation

## 2021-08-03 DIAGNOSIS — F17200 Nicotine dependence, unspecified, uncomplicated: Secondary | ICD-10-CM | POA: Insufficient documentation

## 2021-08-03 DIAGNOSIS — Z7951 Long term (current) use of inhaled steroids: Secondary | ICD-10-CM | POA: Insufficient documentation

## 2021-08-03 DIAGNOSIS — I10 Essential (primary) hypertension: Secondary | ICD-10-CM | POA: Diagnosis not present

## 2021-08-03 DIAGNOSIS — Z7982 Long term (current) use of aspirin: Secondary | ICD-10-CM | POA: Insufficient documentation

## 2021-08-03 DIAGNOSIS — Z7984 Long term (current) use of oral hypoglycemic drugs: Secondary | ICD-10-CM | POA: Diagnosis not present

## 2021-08-03 DIAGNOSIS — J4489 Other specified chronic obstructive pulmonary disease: Secondary | ICD-10-CM | POA: Diagnosis present

## 2021-08-03 DIAGNOSIS — Y833 Surgical operation with formation of external stoma as the cause of abnormal reaction of the patient, or of later complication, without mention of misadventure at the time of the procedure: Secondary | ICD-10-CM | POA: Insufficient documentation

## 2021-08-03 DIAGNOSIS — K9423 Gastrostomy malfunction: Principal | ICD-10-CM | POA: Diagnosis present

## 2021-08-03 DIAGNOSIS — Z20822 Contact with and (suspected) exposure to covid-19: Secondary | ICD-10-CM | POA: Insufficient documentation

## 2021-08-03 DIAGNOSIS — E119 Type 2 diabetes mellitus without complications: Secondary | ICD-10-CM | POA: Diagnosis not present

## 2021-08-03 DIAGNOSIS — Z8673 Personal history of transient ischemic attack (TIA), and cerebral infarction without residual deficits: Secondary | ICD-10-CM | POA: Insufficient documentation

## 2021-08-03 DIAGNOSIS — Z79899 Other long term (current) drug therapy: Secondary | ICD-10-CM | POA: Diagnosis not present

## 2021-08-03 DIAGNOSIS — Z87891 Personal history of nicotine dependence: Secondary | ICD-10-CM | POA: Insufficient documentation

## 2021-08-03 DIAGNOSIS — B182 Chronic viral hepatitis C: Secondary | ICD-10-CM | POA: Diagnosis present

## 2021-08-03 DIAGNOSIS — F32A Depression, unspecified: Secondary | ICD-10-CM | POA: Diagnosis present

## 2021-08-03 DIAGNOSIS — T85528A Displacement of other gastrointestinal prosthetic devices, implants and grafts, initial encounter: Secondary | ICD-10-CM

## 2021-08-03 LAB — RESP PANEL BY RT-PCR (FLU A&B, COVID) ARPGX2
Influenza A by PCR: NEGATIVE
Influenza B by PCR: NEGATIVE
SARS Coronavirus 2 by RT PCR: NEGATIVE

## 2021-08-03 LAB — CBC WITH DIFFERENTIAL/PLATELET
Abs Immature Granulocytes: 0.04 10*3/uL (ref 0.00–0.07)
Basophils Absolute: 0.1 10*3/uL (ref 0.0–0.1)
Basophils Relative: 0 %
Eosinophils Absolute: 0.3 10*3/uL (ref 0.0–0.5)
Eosinophils Relative: 2 %
HCT: 44.3 % (ref 36.0–46.0)
Hemoglobin: 14.2 g/dL (ref 12.0–15.0)
Immature Granulocytes: 0 %
Lymphocytes Relative: 40 %
Lymphs Abs: 4.8 10*3/uL — ABNORMAL HIGH (ref 0.7–4.0)
MCH: 31 pg (ref 26.0–34.0)
MCHC: 32.1 g/dL (ref 30.0–36.0)
MCV: 96.7 fL (ref 80.0–100.0)
Monocytes Absolute: 1 10*3/uL (ref 0.1–1.0)
Monocytes Relative: 8 %
Neutro Abs: 5.8 10*3/uL (ref 1.7–7.7)
Neutrophils Relative %: 50 %
Platelets: 128 10*3/uL — ABNORMAL LOW (ref 150–400)
RBC: 4.58 MIL/uL (ref 3.87–5.11)
RDW: 13.6 % (ref 11.5–15.5)
WBC: 11.9 10*3/uL — ABNORMAL HIGH (ref 4.0–10.5)
nRBC: 0 % (ref 0.0–0.2)

## 2021-08-03 LAB — BASIC METABOLIC PANEL
Anion gap: 8 (ref 5–15)
BUN: 27 mg/dL — ABNORMAL HIGH (ref 8–23)
CO2: 27 mmol/L (ref 22–32)
Calcium: 9.1 mg/dL (ref 8.9–10.3)
Chloride: 102 mmol/L (ref 98–111)
Creatinine, Ser: 0.53 mg/dL (ref 0.44–1.00)
GFR, Estimated: >60 mL/min (ref >60)
Glucose, Bld: 91 mg/dL (ref 70–99)
Potassium: 4.3 mmol/L (ref 3.5–5.1)
Sodium: 137 mmol/L (ref 135–145)

## 2021-08-03 MED ORDER — LACTATED RINGERS IV SOLN: INTRAVENOUS | Status: DC

## 2021-08-03 MED ORDER — VALPROATE SODIUM 100 MG/ML IV SOLN: 250.0000 mg | Freq: Two times a day (BID) | INTRAVENOUS | Status: DC

## 2021-08-03 MED ORDER — ACETAMINOPHEN 325 MG PO TABS: 650.0000 mg | ORAL_TABLET | Freq: Four times a day (QID) | ORAL | Status: DC | PRN

## 2021-08-03 MED ORDER — VALPROATE SODIUM 100 MG/ML IV SOLN
62.5000 mg | Freq: Four times a day (QID) | INTRAVENOUS | Status: DC
  Administered 2021-08-04: 62.5 mg via INTRAVENOUS
  Filled 2021-08-03 (×5): qty 0.63

## 2021-08-03 MED ORDER — HYDRALAZINE HCL 20 MG/ML IJ SOLN: 10.0000 mg | Freq: Four times a day (QID) | INTRAMUSCULAR | Status: DC | PRN

## 2021-08-03 MED ORDER — ACETAMINOPHEN 325 MG RE SUPP: 650.0000 mg | Freq: Four times a day (QID) | RECTAL | Status: DC | PRN

## 2021-08-03 MED ORDER — NICOTINE 14 MG/24HR TD PT24
14.0000 mg | MEDICATED_PATCH | Freq: Every day | TRANSDERMAL | Status: DC
  Administered 2021-08-04: 14 mg via TRANSDERMAL
  Filled 2021-08-03: qty 1

## 2021-08-03 MED ORDER — SODIUM CHLORIDE 0.9% FLUSH
3.0000 mL | Freq: Two times a day (BID) | INTRAVENOUS | Status: DC
  Administered 2021-08-04: 3 mL via INTRAVENOUS

## 2021-08-03 MED ORDER — INSULIN ASPART 100 UNIT/ML IJ SOLN: 0.0000 [IU] | Freq: Three times a day (TID) | INTRAMUSCULAR | Status: DC
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
## 2021-08-03 NOTE — ED Triage Notes (Signed)
Pt via EMS from [facility]. Pt here for a PEG tube replacement. Pt is in NAD. Denies pain.  ?

## 2021-08-03 NOTE — ED Notes (Signed)
Pt pulled her iv out.  Iv restarted.  Pt waiting for admission.   ?

## 2021-08-03 NOTE — Assessment & Plan Note (Signed)
Blood pressure 104/60, pulse 60, temperature 98.5 ?F (36.9 ?C), temperature source Oral, resp. rate 20, height 5\' 6"  (1.676 m), weight 65.8 kg, SpO2 100 %. ?Prn hydralazine. ?dont suspect pt needs antihypertensives currently.  ?

## 2021-08-03 NOTE — Assessment & Plan Note (Signed)
-  admit to med surg.  ?-IR consult for PEG tube replacement.  ?-NPO overnight.  ? ?

## 2021-08-03 NOTE — ED Notes (Signed)
First Nurse Note:  Pt to ED via [facility] from [facility]. Pt had PEG tube came out. Needs to be replaced. Pt VSS. Pt is in NAD.  ?

## 2021-08-03 NOTE — Assessment & Plan Note (Signed)
stable currently LFT within normal limits.  ?

## 2021-08-03 NOTE — Assessment & Plan Note (Signed)
Citalopram held overnight. ? ?

## 2021-08-03 NOTE — Assessment & Plan Note (Signed)
Stable ?We will cont albuterol and nicotine patch.  ?

## 2021-08-03 NOTE — ED Provider Notes (Signed)
? ?[facility] ?Provider Note ? ? ? Event Date/Time  ? First MD Initiated Contact with Patient 08/03/21 1737   ?  (approximate) ? ? ?History  ? ?PEG Tube Replacement ? ? ?HPI ? ?Kaitlyn Ruiz is a 66 y.o. female with a past medical history of CVA PEG tube dependent who presents via EMS from nursing facility after her G-tube became dislodged.  Per report he was found in the bed earlier this morning.  Unclear exactly when it came out.  Last time this happened it seems IR had to replace it.  Patient is nonverbal with this examiner which per review of records is close to baseline.  No G-tube visualized in insertion site.  Belly is otherwise soft. ? ?  ? ? ?Physical Exam  ?Triage Vital Signs: ?ED Triage Vitals  ?Enc Vitals Group  ?   BP 08/03/21 1712 104/60  ?   Pulse Rate 08/03/21 1712 60  ?   Resp 08/03/21 1712 20  ?   Temp 08/03/21 1712 98.5 ?F (36.9 ?C)  ?   Temp Source 08/03/21 1712 Oral  ?   SpO2 08/03/21 1712 100 %  ?   Weight 08/03/21 1713 145 lb (65.8 kg)  ?   Height 08/03/21 1713 5\' 6"  (1.676 m)  ?   Head Circumference --   ?   Peak Flow --   ?   Pain Score --   ?   Pain Loc --   ?   Pain Edu? --   ?   Excl. in GC? --   ? ? ?Most recent vital signs: ?Vitals:  ? 08/03/21 1712  ?BP: 104/60  ?Pulse: 60  ?Resp: 20  ?Temp: 98.5 ?F (36.9 ?C)  ?SpO2: 100%  ? ? ?General: Awake, no distress.  ?CV:  Good peripheral perfusion.  2+ radial pulses. ?Resp:  Normal effort.  Clear bilaterally. ?Abd:  No distention.  Soft. ? ? ?ED Results / Procedures / Treatments  ?Labs ?(all labs ordered are listed, but only abnormal results are displayed) ?Labs Reviewed  ?CBC WITH DIFFERENTIAL/PLATELET - Abnormal; Notable for the following components:  ?    Result Value  ? WBC 11.9 (*)   ? Platelets 128 (*)   ? Lymphs Abs 4.8 (*)   ? All other components within normal limits  ?BASIC METABOLIC PANEL - Abnormal; Notable for the following components:  ? BUN 27 (*)   ? All other components within normal limits  ?RESP PANEL  BY RT-PCR (FLU A&B, COVID) [id number]  ? ? ? ?EKG ? ? ?RADIOLOGY ? ? ?PROCEDURES: ? ?Critical Care performed: No ? ?FEEDING TUBE REPLACEMENT ? ?Date/Time: 08/03/2021 8:17 PM ?Performed by: 10/03/2021, MD ?Authorized by: Gilles Chiquito, MD  ?Indications: tube dislodged ?Local anesthesia used: no ? ?Anesthesia: ?Local anesthesia used: no ? ?Sedation: ?Patient sedated: no ? ?Tube type: gastrostomy ?Patient position: supine ?Tube size: 14 Fr ?Endoscope used: no ?Comments: Aborted due to inability to pass the tip into the tract.  Small amount of blood. ? ? ? ? ? ?MEDICATIONS ORDERED IN ED: ?Medications  ?lactated ringers infusion ( Intravenous New Bag/Given 08/03/21 1950)  ? ? ? ?IMPRESSION / MDM / ASSESSMENT AND PLAN / ED COURSE  ?I reviewed the triage vital signs and the nursing notes. ?             ?               ? ?Patient presents with above-stated history  exam for evaluation after PEG tube reportedly came out earlier today.  No other history is available from patient as she is essentially nonverbal at baseline.  I attempted to reach facility x2 but was unable to do so.  She does not seem to be in any acute distress.  I did attempt to replace PEG tube with a smaller 14 [place] previously placed 16 but was unable to do so.  Discussed with interventional radiology states they cannot use interventional radiology procedures to replace until tomorrow morning.  Given patient receives all of her nutrition and medications through this I do not think it is safe at this time to send her back to her nursing facility.  I will admit to medicine so that she can undergo an IR guided procedure tomorrow.  I discussed with hospitalist will place admission orders.  Basic screening labs sent.  BMP without any significant electrolyte or metabolic derangements.  CBC without leukocytosis ?WBC count of 11.9 without evidence of acute anemia.  I will place patient on maintenance fluid.  I discussed this with admitting hospitalist will place  additional orders. ? ?  ? ? ?FINAL CLINICAL IMPRESSION(S) / ED DIAGNOSES  ? ?Final diagnoses:  ?Dislodged gastrostomy tube  ? ? ? ?Rx / DC Orders  ? ?ED Discharge Orders   ? ? None  ? ?  ? ? ? ?Note:  This document was prepared using Dragon voice recognition software and may include unintentional dictation errors. ?  ?Gilles Chiquito, MD ?08/03/21 2018 ? ?

## 2021-08-03 NOTE — Assessment & Plan Note (Signed)
-   SSI regimen. ?-hold home regimen as pt is NPO. ? ?

## 2021-08-04 ENCOUNTER — Observation Stay: Payer: Medicare Other | Admitting: Radiology

## 2021-08-04 DIAGNOSIS — K9423 Gastrostomy malfunction: Secondary | ICD-10-CM | POA: Diagnosis not present

## 2021-08-04 DIAGNOSIS — I6350 Cerebral infarction due to unspecified occlusion or stenosis of unspecified cerebral artery: Secondary | ICD-10-CM | POA: Insufficient documentation

## 2021-08-04 HISTORY — PX: IR REPLC GASTRO/COLONIC TUBE PERCUT W/FLUORO: IMG2333

## 2021-08-04 LAB — CBC
HCT: 43.3 % (ref 36.0–46.0)
Hemoglobin: 13.9 g/dL (ref 12.0–15.0)
MCH: 30.8 pg (ref 26.0–34.0)
MCHC: 32.1 g/dL (ref 30.0–36.0)
MCV: 95.8 fL (ref 80.0–100.0)
Platelets: 128 10*3/uL — ABNORMAL LOW (ref 150–400)
RBC: 4.52 MIL/uL (ref 3.87–5.11)
RDW: 13.6 % (ref 11.5–15.5)
WBC: 12.1 10*3/uL — ABNORMAL HIGH (ref 4.0–10.5)
nRBC: 0 % (ref 0.0–0.2)

## 2021-08-04 LAB — CBG MONITORING, ED
Glucose-Capillary: 72 mg/dL (ref 70–99)
Glucose-Capillary: 74 mg/dL (ref 70–99)

## 2021-08-04 LAB — COMPREHENSIVE METABOLIC PANEL
ALT: 39 U/L (ref 0–44)
AST: 60 U/L — ABNORMAL HIGH (ref 15–41)
Albumin: 2.9 g/dL — ABNORMAL LOW (ref 3.5–5.0)
Alkaline Phosphatase: 107 U/L (ref 38–126)
Anion gap: 10 (ref 5–15)
BUN: 26 mg/dL — ABNORMAL HIGH (ref 8–23)
CO2: 28 mmol/L (ref 22–32)
Calcium: 9.4 mg/dL (ref 8.9–10.3)
Chloride: 103 mmol/L (ref 98–111)
Creatinine, Ser: 0.6 mg/dL (ref 0.44–1.00)
GFR, Estimated: >60 mL/min (ref >60)
Glucose, Bld: 80 mg/dL (ref 70–99)
Potassium: 4 mmol/L (ref 3.5–5.1)
Sodium: 141 mmol/L (ref 135–145)
Total Bilirubin: 0.5 mg/dL (ref 0.3–1.2)
Total Protein: 8.8 g/dL — ABNORMAL HIGH (ref 6.5–8.1)

## 2021-08-04 MED ORDER — DEXTROSE-NACL 5-0.9 % IV SOLN
INTRAVENOUS | Status: DC
Start: 2021-08-04 — End: 2021-08-04

## 2021-08-04 MED ORDER — IOHEXOL 350 MG/ML SOLN
10.0000 mL | Freq: Once | INTRAVENOUS | Status: AC | PRN
  Administered 2021-08-04: 10 mL

## 2021-08-04 MED ORDER — MUPIROCIN 2 % EX OINT
1.0000 "application " | TOPICAL_OINTMENT | Freq: Two times a day (BID) | CUTANEOUS | Status: DC
  Filled 2021-08-04: qty 22

## 2021-08-04 MED ORDER — LIDOCAINE VISCOUS HCL 2 % MT SOLN
OROMUCOSAL | Status: AC
  Filled 2021-08-04: qty 15

## 2021-08-04 NOTE — Procedures (Signed)
Pre procedural Dx: Dysphagia, dislodged feeding tube. ?Post procedural Dx: Same ? ?Successful fluoroscopic guided replacement of existing 16 Fr gastrostomy tube.   ?The feeding tube is ready for immediate use. ? ?EBL: Trace ? ?Complications: None immediate. ? ?Katherina Right, MD ?Pager #: [phone] ?  ?

## 2021-08-04 NOTE — ED Notes (Signed)
Report called to Jasmine December at [employer]. ?

## 2021-08-04 NOTE — Progress Notes (Signed)
ED Secretary notified to arrange for EMS transport, per secretary transport has already been arranged. Patient is currently awaiting pickup.  ?

## 2021-08-04 NOTE — Progress Notes (Signed)
Spoke with nurse at [facility] to complete admission profile ?

## 2021-08-04 NOTE — TOC Initial Note (Signed)
Transition of Care (TOC) - Initial/Assessment Note  ? ? ?Patient Details  ?Name: Kaitlyn Ruiz ?MRN: [id number] ?Date of Birth: [birth year] ? ?Transition of Care (TOC) CM/SW Contact:    ?Shelbie Hutching, RN ?Phone Number: ?08/04/2021, 10:28 AM ? ?Clinical Narrative:                 ?Patient is long term care at [facility].  Previous Stroke, patient is non verbal and total care with G tube.  G tube was pulled out at facility and has now been replaced by interventional radiology.  Per MD patient can return to facility today.   ? ?Expected Discharge Plan: [place] ?Barriers to Discharge: Barriers Resolved ? ? ?Patient Goals and CMS Choice ?Patient states their goals for this hospitalization and ongoing recovery are:: Patient will return to [facility] ?  ?  ? ?Expected Discharge Plan and Services ?Expected Discharge Plan: [place] ?  ?Discharge Planning Services: CM Consult ?Post Acute Care Choice: [place], Resumption of Svcs/PTA Provider ?Living arrangements for the past 2 months: [place] ?                ?DME Arranged: N/A ?DME Agency: NA ?  ?  ?  ?HH Arranged: NA ?[place] Agency: NA ?  ?  ?  ? ?Prior Living Arrangements/Services ?Living arrangements for the past 2 months: [place] ?Lives with:: Facility Resident ?Patient language and need for interpreter reviewed:: Yes ?Do you feel safe going back to the place where you live?: Yes      ?Need for Family Participation in Patient Care: Yes (Comment) ?Care giver support system in place?: Yes (comment) ?  ?Criminal Activity/Legal Involvement Pertinent to Current Situation/Hospitalization: No - Comment as needed ? ?Activities of Daily Living ?Home Assistive Devices/Equipment: Other (Comment) (patient from LTC) ?ADL Screening (condition at time of admission) ?Patient's cognitive ability adequate to safely complete daily activities?: No ?Is the patient deaf or have  difficulty hearing?: No ?Does the patient have difficulty seeing, even when wearing glasses/contacts?: No ?Does the patient have difficulty concentrating, remembering, or making decisions?: Yes ?Patient able to express need for assistance with ADLs?: No ?Does the patient have difficulty dressing or bathing?: Yes ?Independently performs ADLs?: No ?Communication: Dependent ?Is this a change from baseline?: Pre-admission baseline ?Dressing (OT): Dependent ?Is this a change from baseline?: Pre-admission baseline ?Grooming: Dependent ?Is this a change from baseline?: Pre-admission baseline ?Feeding: Dependent ?Is this a change from baseline?: Pre-admission baseline ?Bathing: Dependent ?Is this a change from baseline?: Pre-admission baseline ?Toileting: Dependent ?Is this a change from baseline?: Pre-admission baseline ?In/Out Bed: Dependent ?Is this a change from baseline?: Pre-admission baseline ?Walks in Home: Dependent ?Is this a change from baseline?: Pre-admission baseline ?Does the patient have difficulty walking or climbing stairs?: Yes ?Weakness of Legs: Both ?Weakness of Arms/Hands: Both ? ?Permission Sought/Granted ?Permission sought to share information with : Case Freight forwarder, Customer service manager ?Permission granted to share information with : Yes, Verbal Permission Granted ?   ? Permission granted to share info w AGENCY: [employer] ?   ?   ? ?Emotional Assessment ?Appearance:: Appears stated age ?  ?  ?  ?Alcohol / Substance Use: Not Applicable ?Psych Involvement: No (comment) ? ?Admission diagnosis:  Malfunction of percutaneous endoscopic gastrostomy (PEG) tube ([place]) [K94.23] ?Patient Active Problem List  ? Diagnosis Date Noted  ? Cerebral artery occlusion with cerebral infarction ([place]) 08/04/2021  ? Malfunction of percutaneous  endoscopic gastrostomy (PEG) tube ([place]) 08/03/2021  ? Chronic hepatitis C ([place]) 08/03/2021  ? Depression 08/03/2021  ? Tobacco use disorder 08/03/2021  ?  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 Acute on chronic respiratory failure with hypoxia ([place]) 05/26/2019  ? 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 Non-insulin dependent type 2 diabetes mellitus ([place]) 05/26/2019  ? Hyponatremia 05/26/2019  ? UTI (urinary tract infection) 05/26/2019  ? 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 Dysphagia as late effect of cerebrovascular accident (CVA) 05/26/2019  ? Appetite impaired 08/15/2018  ? Anorexia 06/05/2018  ? Weakness generalized 06/05/2018  ? Esophageal dysphagia   ? Oropharyngeal dysphagia   ? Neurological dysfunction   ? Endotracheal tube present   ? Palliative care encounter   ? Acute respiratory failure with hypoxemia ([place]) 05/14/2018  ? Acute metabolic encephalopathy   ? Septic shock ([place]) 04/10/2017  ? HTN (hypertension) 12/28/2013  ? ?PCP:  Marco Collie, MD ?Pharmacy:   ?[facility], [place] ?[id number] ?[place] [address] ?Phone: [phone] Fax: [phone] ? ? ? ? ?Social Determinants of Health (SDOH) Interventions ?  ? ?Readmission Risk Interventions ?No flowsheet data found. ? ? ?

## 2021-08-04 NOTE — TOC Transition Note (Signed)
Transition of Care (TOC) - CM/SW Discharge Note ? ? ?Patient Details  ?Name: Kaitlyn Ruiz ?MRN: [id number] ?Date of Birth: 10-Dec-1955 ? ?Transition of Care (TOC) CM/SW Contact:  ?Allayne Butcher, RN ?Phone Number: ?08/04/2021, 10:32 AM ? ? ?Clinical Narrative:    ?Patient will return to [facility] room 80A , ED RN will call report to [phone].  ED secretary can arrange EMS transport.   ? ? ?Final next level of care: Skilled Nursing Facility ?Barriers to Discharge: Barriers Resolved ? ? ?Patient Goals and CMS Choice ?Patient states their goals for this hospitalization and ongoing recovery are:: Patient will return to [facility] ?  ?  ? ?Discharge Placement ?  ?           ?  ?  ?  ?  ? ?Discharge Plan and Services ?  ?Discharge Planning Services: CM Consult ?Post Acute Care Choice: Skilled Nursing Facility, Resumption of Svcs/PTA Provider          ?DME Arranged: N/A ?DME Agency: NA ?  ?  ?  ?HH Arranged: NA ?HH Agency: NA ?  ?  ?  ? ?Social Determinants of Health (SDOH) Interventions ?  ? ? ?Readmission Risk Interventions ?No flowsheet data found. ? ? ? ? ?

## 2021-08-04 NOTE — Discharge Summary (Signed)
Kaitlyn Ruiz [id number] DOB: [birth year] DOA: 08/03/2021  PCP: Marco Collie, MD  Admit date: 08/03/2021 Discharge date: 08/04/2021  Time spent: 35 minutes  Recommendations for Outpatient Follow-up:  Use of abdominal binder may help prevent dislodgement of feeding tube     Discharge Diagnoses:  Principal Problem:   Malfunction of percutaneous endoscopic gastrostomy (PEG) tube ([place]) Active Problems:   Non-insulin dependent type 2 diabetes mellitus ([place])   COPD with chronic bronchitis ([place])   Chronic hepatitis C ([place])   HTN (hypertension)   Depression   Discharge Condition: stable  Diet recommendation: tube feeds  Filed Weights   08/03/21 1713  Weight: 65.8 kg    History of present illness:  From admission h and p by dr. Posey Pronto: Kaitlyn Ruiz is a 66 y.o. female with medical history significant of CVA/ nonverbal and DM II, HTN brought to [place] for her peg tube malfunction. No other concern from edmd. Tube could not be reinserted and pt needs IR. Pt has  Successful insertion of gastrostomy tube. 56 French gastrostomy tube currently in place. Unable to insert 18 French gastrostomy tube due to tightness of opening on 07/04/2021.  Hospital Course:  Patient presented from her skilled nursing facility after dislodgement of her feeding tube. IR was consulted and they successfully replaced the feeding tube on 3/7 without complications. They say it is available for immediate use and that the patient requires no inpatient monitoring. We have placed an abdominal binder to hopefully help avoid inadvertent self-discontinuation of the feeding tube.  Procedures: IR fluoroscopic-guided replacement of 16-french gastrostomy tube  Consultations: IR  Discharge Exam: Vitals:   08/04/21 0709 08/04/21 0725  BP:  110/67  Pulse:  60  Resp: 14 16  Temp:    SpO2:  94%    General: NAD, chronically ill appearing Cardiovascular: RRR Respiratory: Rales at base Abd: abdominal binder in  place  Discharge Instructions   Discharge Instructions     Increase activity slowly   Complete by: As directed       Allergies as of 08/04/2021   No Known Allergies      Medication List     STOP taking these medications    Hypromellose 0.4 % Soln   magnesium hydroxide 400 MG/5ML suspension Commonly known as: MILK OF MAGNESIA   metFORMIN 500 MG tablet Commonly known as: GLUCOPHAGE   Multi-Vitamins Tabs   Nyamyc powder Generic drug: nystatin   polyethylene glycol 17 g packet Commonly known as: MIRALAX / GLYCOLAX       TAKE these medications    acidophilus Caps capsule Take 1 capsule by mouth 3 (three) times daily.   albuterol 108 (90 Base) MCG/ACT inhaler Commonly known as: VENTOLIN HFA Inhale 2 puffs into the lungs every 4 (four) hours as needed for wheezing or shortness of breath.   aspirin 81 MG chewable tablet Place 1 tablet (81 mg total) into feeding tube daily.   budesonide 0.25 MG/2ML nebulizer solution Commonly known as: PULMICORT Take 2 mLs (0.25 mg total) by nebulization 2 (two) times daily.   Cholecalciferol 1.25 MG (50000 UT) Tabs 1 tablet by PEG Tube route as directed. Every 60 days   citalopram 10 MG tablet Commonly known as: CELEXA Place 10 mg into feeding tube daily. Vie PEG-Tube   docusate 50 MG/5ML liquid Commonly known as: COLACE Place 10 mLs (100 mg total) into feeding tube 2 (two) times daily.   feeding supplement (OSMOLITE 1.5 CAL) Liqd Place 237 mLs into feeding tube 5 (five)  times daily.   feeding supplement (PRO-STAT SUGAR FREE 64) Liqd Place 30 mLs into feeding tube 2 (two) times daily.   free water Soln Place 180 mLs into feeding tube 5 (five) times daily.   gabapentin 250 MG/5ML solution Commonly known as: NEURONTIN Place 6 mLs (300 mg total) into feeding tube every 8 (eight) hours.   ipratropium-albuterol 0.5-2.5 (3) MG/3ML Soln Commonly known as: DUONEB Take 3 mLs by nebulization every 4 (four) hours.    lisinopril 2.5 MG tablet Commonly known as: ZESTRIL Place 1 tablet (2.5 mg total) into feeding tube daily.   mouth rinse Liqd solution 15 mLs by Mouth Rinse route 2 times daily at 12 noon and 4 pm.   pravastatin 20 MG tablet Commonly known as: PRAVACHOL Place 20 mg into feeding tube at bedtime. Via PEG-Tube   scopolamine 1 MG/3DAYS Commonly known as: TRANSDERM-SCOP Place 1 patch (1.5 mg total) onto the skin every 3 (three) days.   valproic acid 250 MG/5ML solution Commonly known as: DEPAKENE Place 2.5 mLs (125 mg total) into feeding tube 2 (two) times daily. What changed: how much to take       No Known Allergies    The results of significant diagnostics from this hospitalization (including imaging, microbiology, ancillary and laboratory) are listed below for reference.    Significant Diagnostic Studies: IR Replc Gastro/Colonic Tube Percut W/Fluoro  Result Date: 08/04/2021 INDICATION: Inadvertent removal of percutaneous gastrostomy tube. EXAM: FLUOROSCOPIC GUIDED REPLACEMENT OF GASTROSTOMY TUBE COMPARISON:  Fluoroscopic guided gastrostomy tube exchange-07/04/2021 MEDICATIONS: None. CONTRAST:  1mL OMNIPAQUE IOHEXOL 350 MG/ML SOLN - administered into the gastric lumen FLUOROSCOPY TIME:  42 seconds (9.3 mGy) COMPLICATIONS: None immediate. PROCEDURE: A timeout was performed prior to the initiation of the procedure. The upper abdomen and external portion of the existing gastrostomy tube was prepped and draped in the usual sterile fashion, and a sterile drape was applied covering the operative field. Maximum barrier sterile technique with sterile gowns and gloves were used for the procedure. A timeout was performed prior to the initiation of the procedure. The site of the prior gastrostomy tube was cannulated with a Kumpe catheter was advanced to the level of the gastric lumen. Contrast injection confirmed appropriate positioning Next, over a short Amplatz wire, the tract was sequentially  dilated, ultimately allowing placement of a 16 French balloon retention kangaroo type gastrostomy tube. The retention balloon was inflated with approximately 20 cc of saline and dilute contrast and pulled against the anterior inferior aspect of the gastric lumen. The external disc was cinched. Limited contrast injection was performed demonstrating appropriate position functionality of the gastrostomy tube. A dressing was applied. The patient tolerated the procedure well without immediate postprocedural complication. IMPRESSION: Successful fluoroscopic guided replacement of a new 16-French gastrostomy tube. The gastrostomy tube is ready for immediate use. Electronically Signed   By: Sandi Mariscal M.D.   On: 08/04/2021 11:15    Microbiology: Recent Results (from the past 240 hour(s))  Resp Panel by RT-PCR (Flu A&B, Covid) Nasopharyngeal Swab     Status: None   Collection Time: 08/03/21  2:35 PM   Specimen: Nasopharyngeal Swab; Nasopharyngeal(NP) swabs in vial transport medium  Result Value Ref Range Status   SARS Coronavirus 2 by RT PCR NEGATIVE NEGATIVE Final    Comment: (NOTE) SARS-CoV-2 target nucleic acids are NOT DETECTED.  The SARS-CoV-2 RNA is generally detectable in upper respiratory specimens during the acute phase of infection. The lowest concentration of SARS-CoV-2 viral copies this assay can detect is  138 copies/mL. A negative result does not preclude SARS-Cov-2 infection and should not be used as the sole basis for treatment or other patient management decisions. A negative result may occur with  improper specimen collection/handling, submission of specimen other than nasopharyngeal swab, presence of viral mutation(s) within the areas targeted by this assay, and inadequate number of viral copies(<138 copies/mL). A negative result must be combined with clinical observations, patient history, and epidemiological information. The expected result is Negative.  Fact Sheet for Patients:   EntrepreneurPulse.com.au  Fact Sheet for Healthcare Providers:  IncredibleEmployment.be  This test is no t yet approved or cleared by the [place] FDA and  has been authorized for detection and/or diagnosis of SARS-CoV-2 by FDA under an Emergency Use Authorization (EUA). This EUA will remain  in effect (meaning this test can be used) for the duration of the COVID-19 declaration under Section 564(b)(1) of the Act, 21 U.S.C.section 360bbb-3(b)(1), unless the authorization is terminated  or revoked sooner.       Influenza A by PCR NEGATIVE NEGATIVE Final   Influenza B by PCR NEGATIVE NEGATIVE Final    Comment: (NOTE) The Xpert Xpress SARS-CoV-2/FLU/RSV plus assay is intended as an aid in the diagnosis of influenza from Nasopharyngeal swab specimens and should not be used as a sole basis for treatment. Nasal washings and aspirates are unacceptable for Xpert Xpress SARS-CoV-2/FLU/RSV testing.  Fact Sheet for Patients: EntrepreneurPulse.com.au  Fact Sheet for Healthcare Providers: IncredibleEmployment.be  This test is not yet approved or cleared by the [place] FDA and has been authorized for detection and/or diagnosis of SARS-CoV-2 by FDA under an Emergency Use Authorization (EUA). This EUA will remain in effect (meaning this test can be used) for the duration of the COVID-19 declaration under Section 564(b)(1) of the Act, 21 U.S.C. section 360bbb-3(b)(1), unless the authorization is terminated or revoked.  Performed at [facility], [address] [place], [place] [address]      Labs: Basic Metabolic Panel: Recent Labs  Lab 08/03/21 1935 08/04/21 0622  NA 137 141  K 4.3 4.0  CL 102 103  CO2 27 28  GLUCOSE 91 80  BUN 27* 26*  CREATININE 0.53 0.60  CALCIUM 9.1 9.4   Liver Function Tests: Recent Labs  Lab 08/04/21 0622  AST 60*  ALT 39  ALKPHOS 107  BILITOT 0.5  PROT  8.8*  ALBUMIN 2.9*   No results for input(s): LIPASE, AMYLASE in the last 168 hours. No results for input(s): AMMONIA in the last 168 hours. CBC: Recent Labs  Lab 08/03/21 1935 08/04/21 0622  WBC 11.9* 12.1*  NEUTROABS 5.8  --   HGB 14.2 13.9  HCT 44.3 43.3  MCV 96.7 95.8  PLT 128* 128*   Cardiac Enzymes: No results for input(s): CKTOTAL, CKMB, CKMBINDEX, TROPONINI in the last 168 hours. BNP: BNP (last 3 results) No results for input(s): BNP in the last 8760 hours.  ProBNP (last 3 results) No results for input(s): PROBNP in the last 8760 hours.  CBG: Recent Labs  Lab 08/04/21 0809  GLUCAP 72       Signed:  Desma Maxim MD.  [facility] 08/04/2021, 11:23 AM

## 2021-08-04 NOTE — [place] FL2 (Signed)
?[place] MEDICAID FL2 LEVEL OF CARE SCREENING TOOL  ?  ? ?IDENTIFICATION  ?Patient Name: ?Kaitlyn Ruiz Birthdate: June 30, 1955 Sex: female Admission Date (Current Location): ?08/03/2021  ?[place] Number: ?  ?  Facility and Address:  ?[facility], [address] [place], [place] [address] ?     Provider Number: ?[id number]  ?Attending Physician Name and Address:  ?Kathrynn Running, MD ? Relative Name and Phone Number:  ?[name] [phone] ?   ?Current Level of Care: ?SNF Recommended Level of Care: ?Skilled Nursing Facility Prior Approval Number: ?  ? ?Date Approved/Denied: ?  PASRR Number: ?  ? ?Discharge Plan: ?SNF ?  ? ?Current Diagnoses: ?Patient Active Problem List  ? Diagnosis Date Noted  ? Cerebral artery occlusion with cerebral infarction (HCC) 08/04/2021  ? Malfunction of percutaneous endoscopic gastrostomy (PEG) tube (HCC) 08/03/2021  ? Chronic hepatitis C (HCC) 08/03/2021  ? Depression 08/03/2021  ? Tobacco use disorder 08/03/2021  ? 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 Acute on chronic respiratory failure with hypoxia (HCC) 05/26/2019  ? 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/26/2019  ? Non-insulin dependent type 2 diabetes mellitus (HCC) 05/26/2019  ? Hyponatremia 05/26/2019  ? UTI (urinary tract infection) 05/26/2019  ? 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 Dysphagia as late effect of cerebrovascular accident (CVA) 05/26/2019  ? Appetite impaired 08/15/2018  ? Anorexia 06/05/2018  ? Weakness generalized 06/05/2018  ? Esophageal dysphagia   ? Oropharyngeal dysphagia   ? Neurological dysfunction   ? Endotracheal tube present   ? Palliative care encounter   ? Acute respiratory failure with hypoxemia (HCC) 05/14/2018  ? Acute metabolic encephalopathy   ? Septic shock (HCC) 04/10/2017  ? HTN (hypertension) 12/28/2013  ? ? ?Orientation RESPIRATION BLADDER Height & Weight   ?  ?  ? Normal Incontinent  Weight: 65.8 kg ?Height:  5\' 6"  (167.6 cm)  ?BEHAVIORAL SYMPTOMS/MOOD NEUROLOGICAL BOWEL NUTRITION STATUS  ?    Incontinent Feeding tube  ?AMBULATORY STATUS COMMUNICATION OF NEEDS Skin   ?Total Care Non-Verbally Normal ?  ?  ?  ?    ?     ?     ? ? ?Personal Care Assistance Level of Assistance  ?Total care   ?  ?  ?Total Care Assistance: Maximum assistance  ? ?Functional Limitations Info  ?    ?  ?   ? ? ?SPECIAL CARE FACTORS FREQUENCY  ?    ?  ?  ?  ?  ?  ?  ?   ? ? ?Contractures Contractures Info: Not present  ? ? ?Additional Factors Info  ?Code Status, Allergies Code Status Info: Full ?Allergies Info: NKA ?  ?  ?  ?   ? ?Current Medications (08/04/2021):  This is the current hospital active medication list ?Current Facility-Administered Medications  ?Medication Dose Route Frequency Provider Last Rate Last Admin  ? acetaminophen (TYLENOL) tablet 650 mg  650 mg Oral Q6H PRN Gertha Calkin, MD      ? Or  ? acetaminophen (TYLENOL) suppository 650 mg  650 mg Rectal Q6H PRN Gertha Calkin, MD      ? dextrose 5 %-0.9 % sodium chloride infusion   Intravenous Continuous Kathrynn Running, MD 100 mL/hr at 08/04/21 0848 New Bag at 08/04/21 0848  ? hydrALAZINE (APRESOLINE) injection 10 mg  10 mg Intravenous Q6H PRN Gertha Calkin, MD      ? insulin aspart (novoLOG) injection  0-6 Units  0-6 Units Subcutaneous TID WC Gertha Calkin, MD      ? mupirocin ointment (BACTROBAN) 2 % 1 application.  1 application. Nasal BID Wouk, Wilfred Curtis, MD      ? nicotine (NICODERM CQ - dosed in mg/24 hours) patch 14 mg  14 mg Transdermal Daily Gertha Calkin, MD   14 mg at 08/04/21 0325  ? sodium chloride flush (NS) 0.9 % injection 3 mL  3 mL Intravenous Q12H Irena Cords V, MD   3 mL at 08/04/21 0210  ? valproate (DEPACON) 62.5 mg in dextrose 5 % 50 mL IVPB  62.5 mg Intravenous Q6H Sharen Hones, RPH   Stopped at 08/04/21 0309  ? ?Current Outpatient Medications  ?Medication Sig Dispense Refill  ? acidophilus (RISAQUAD) CAPS capsule Take 1  capsule by mouth 3 (three) times daily.    ? albuterol (VENTOLIN HFA) 108 (90 Base) MCG/ACT inhaler Inhale 2 puffs into the lungs every 4 (four) hours as needed for wheezing or shortness of breath.    ? aspirin 81 MG chewable tablet Place 1 tablet (81 mg total) into feeding tube daily.    ? budesonide (PULMICORT) 0.25 MG/2ML nebulizer solution Take 2 mLs (0.25 mg total) by nebulization 2 (two) times daily. 60 mL 12  ? Cholecalciferol 1.25 MG (50000 UT) TABS 1 tablet by PEG Tube route as directed. Every 60 days 1 tablet   ? citalopram (CELEXA) 10 MG tablet Place 10 mg into feeding tube daily. Vie PEG-Tube    ? docusate (COLACE) 50 MG/5ML liquid Place 10 mLs (100 mg total) into feeding tube 2 (two) times daily. 100 mL 0  ? gabapentin (NEURONTIN) 250 MG/5ML solution Place 6 mLs (300 mg total) into feeding tube every 8 (eight) hours.  12  ? lisinopril (PRINIVIL,ZESTRIL) 2.5 MG tablet Place 1 tablet (2.5 mg total) into feeding tube daily.    ? mouth rinse LIQD solution 15 mLs by Mouth Rinse route 2 times daily at 12 noon and 4 pm.  0  ? pravastatin (PRAVACHOL) 20 MG tablet Place 20 mg into feeding tube at bedtime. Via PEG-Tube    ? scopolamine (TRANSDERM-SCOP) 1 MG/3DAYS Place 1 patch (1.5 mg total) onto the skin every 3 (three) days. 10 patch 12  ? valproic acid (DEPAKENE) 250 MG/5ML SOLN solution Place 2.5 mLs (125 mg total) into feeding tube 2 (two) times daily. (Patient taking differently: Place 100 mg into feeding tube 2 (two) times daily.) 600 mL   ? Amino Acids-Protein Hydrolys (FEEDING SUPPLEMENT, PRO-STAT SUGAR FREE 64,) LIQD Place 30 mLs into feeding tube 2 (two) times daily. 887 mL 0  ? Hypromellose 0.4 % SOLN Place 1 drop into both eyes daily.  (Patient not taking: Reported on 08/03/2021)    ? ipratropium-albuterol (DUONEB) 0.5-2.5 (3) MG/3ML SOLN Take 3 mLs by nebulization every 4 (four) hours. 360 mL   ? magnesium hydroxide (MILK OF MAGNESIA) 400 MG/5ML suspension Place 30 mLs into feeding tube daily as  needed for mild constipation. (Patient not taking: Reported on 08/03/2021) 360 mL 0  ? metFORMIN (GLUCOPHAGE) 500 MG tablet Take 500 mg by mouth 2 (two) times daily. (Patient not taking: Reported on 08/03/2021)    ? Multiple Vitamin (MULTI-VITAMINS) TABS 1 tablet by PEG Tube route daily. (Patient not taking: Reported on 08/03/2021) 30 tablet   ? Nutritional Supplements (FEEDING SUPPLEMENT, OSMOLITE 1.5 CAL,) LIQD Place 237 mLs into feeding tube 5 (five) times daily.  0  ? NYAMYC powder Apply  topically. (Patient not taking: Reported on 08/03/2021)    ? polyethylene glycol (MIRALAX / GLYCOLAX) packet 17 g by Per NG tube route daily as needed (no bowel movement x 24 hours). (Patient not taking: Reported on 08/03/2021) 14 each 0  ? Water For Irrigation, Sterile (FREE WATER) SOLN Place 180 mLs into feeding tube 5 (five) times daily.    ? ? ? ?Discharge Medications: ?Please see discharge summary for a list of discharge medications. ? ?Relevant Imaging Results: ? ?Relevant Lab Results: ? ? ?Additional Information ?SS# [id number] ? ?Allayne Butcher, RN ? ? ? ? ?

## 2021-08-04 NOTE — ED Notes (Signed)
CBG 72  

## 2021-08-04 NOTE — Progress Notes (Signed)
EMS arrived to transport patient to [facility] VSS. No acute distress noted. Patient discharged.  ?

## 2021-08-04 NOTE — ED Notes (Signed)
Abd binder placed with helped of Mel,NT. Pt tolerated well.  ?

## 2021-08-04 NOTE — ED Notes (Signed)
Pt taken to IR

## 2021-08-11 ENCOUNTER — Emergency Department: Payer: Medicare Other

## 2021-08-11 ENCOUNTER — Encounter: Payer: Self-pay | Admitting: Radiology

## 2021-08-11 ENCOUNTER — Inpatient Hospital Stay
Admission: EM | Admit: 2021-08-11 | Discharge: 2021-08-24 | DRG: 871 | Disposition: A | Discharge status: Skilled Nursing Facility | Payer: Medicare Other | Attending: Internal Medicine | Admitting: Internal Medicine

## 2021-08-11 ENCOUNTER — Inpatient Hospital Stay: Payer: Medicare Other

## 2021-08-11 DIAGNOSIS — E870 Hyperosmolality and hypernatremia: Secondary | ICD-10-CM

## 2021-08-11 DIAGNOSIS — J9601 Acute respiratory failure with hypoxia: Secondary | ICD-10-CM | POA: Diagnosis not present

## 2021-08-11 DIAGNOSIS — Z20822 Contact with and (suspected) exposure to covid-19: Secondary | ICD-10-CM | POA: Diagnosis present

## 2021-08-11 DIAGNOSIS — E43 Unspecified severe protein-calorie malnutrition: Secondary | ICD-10-CM | POA: Diagnosis present

## 2021-08-11 DIAGNOSIS — Z79899 Other long term (current) drug therapy: Secondary | ICD-10-CM

## 2021-08-11 DIAGNOSIS — I248 Other forms of acute ischemic heart disease: Secondary | ICD-10-CM | POA: Diagnosis present

## 2021-08-11 DIAGNOSIS — T2230XS Burn of third degree of shoulder and upper limb, except wrist and hand, unspecified site, sequela: Secondary | ICD-10-CM

## 2021-08-11 DIAGNOSIS — D62 Acute posthemorrhagic anemia: Secondary | ICD-10-CM | POA: Diagnosis not present

## 2021-08-11 DIAGNOSIS — F329 Major depressive disorder, single episode, unspecified: Secondary | ICD-10-CM | POA: Diagnosis present

## 2021-08-11 DIAGNOSIS — D696 Thrombocytopenia, unspecified: Secondary | ICD-10-CM | POA: Diagnosis not present

## 2021-08-11 DIAGNOSIS — Z789 Other specified health status: Secondary | ICD-10-CM | POA: Diagnosis not present

## 2021-08-11 DIAGNOSIS — J441 Chronic obstructive pulmonary disease with (acute) exacerbation: Secondary | ICD-10-CM | POA: Diagnosis present

## 2021-08-11 DIAGNOSIS — R778 Other specified abnormalities of plasma proteins: Secondary | ICD-10-CM | POA: Diagnosis not present

## 2021-08-11 DIAGNOSIS — E871 Hypo-osmolality and hyponatremia: Secondary | ICD-10-CM | POA: Diagnosis not present

## 2021-08-11 DIAGNOSIS — I69322 Dysarthria following cerebral infarction: Secondary | ICD-10-CM | POA: Diagnosis not present

## 2021-08-11 DIAGNOSIS — T17890A Other foreign object in other parts of respiratory tract causing asphyxiation, initial encounter: Secondary | ICD-10-CM | POA: Diagnosis not present

## 2021-08-11 DIAGNOSIS — E119 Type 2 diabetes mellitus without complications: Secondary | ICD-10-CM | POA: Diagnosis present

## 2021-08-11 DIAGNOSIS — Z931 Gastrostomy status: Secondary | ICD-10-CM | POA: Diagnosis not present

## 2021-08-11 DIAGNOSIS — I69351 Hemiplegia and hemiparesis following cerebral infarction affecting right dominant side: Secondary | ICD-10-CM | POA: Diagnosis not present

## 2021-08-11 DIAGNOSIS — J440 Chronic obstructive pulmonary disease with acute lower respiratory infection: Secondary | ICD-10-CM | POA: Diagnosis present

## 2021-08-11 DIAGNOSIS — J9602 Acute respiratory failure with hypercapnia: Secondary | ICD-10-CM | POA: Diagnosis not present

## 2021-08-11 DIAGNOSIS — A419 Sepsis, unspecified organism: Secondary | ICD-10-CM | POA: Diagnosis present

## 2021-08-11 DIAGNOSIS — Z9981 Dependence on supplemental oxygen: Secondary | ICD-10-CM

## 2021-08-11 DIAGNOSIS — I69320 Aphasia following cerebral infarction: Secondary | ICD-10-CM

## 2021-08-11 DIAGNOSIS — T884XXA Failed or difficult intubation, initial encounter: Secondary | ICD-10-CM | POA: Diagnosis not present

## 2021-08-11 DIAGNOSIS — N19 Unspecified kidney failure: Secondary | ICD-10-CM

## 2021-08-11 DIAGNOSIS — T2137XS Burn of third degree of female genital region, sequela: Secondary | ICD-10-CM

## 2021-08-11 DIAGNOSIS — J9600 Acute respiratory failure, unspecified whether with hypoxia or hypercapnia: Secondary | ICD-10-CM | POA: Diagnosis present

## 2021-08-11 DIAGNOSIS — F0390 Unspecified dementia without behavioral disturbance: Secondary | ICD-10-CM | POA: Diagnosis not present

## 2021-08-11 DIAGNOSIS — J9621 Acute and chronic respiratory failure with hypoxia: Secondary | ICD-10-CM | POA: Diagnosis present

## 2021-08-11 DIAGNOSIS — F0393 Unspecified dementia, unspecified severity, with mood disturbance: Secondary | ICD-10-CM | POA: Diagnosis present

## 2021-08-11 DIAGNOSIS — L905 Scar conditions and fibrosis of skin: Secondary | ICD-10-CM | POA: Diagnosis present

## 2021-08-11 DIAGNOSIS — Z8673 Personal history of transient ischemic attack (TIA), and cerebral infarction without residual deficits: Principal | ICD-10-CM

## 2021-08-11 DIAGNOSIS — E876 Hypokalemia: Secondary | ICD-10-CM

## 2021-08-11 DIAGNOSIS — Z87891 Personal history of nicotine dependence: Secondary | ICD-10-CM

## 2021-08-11 DIAGNOSIS — G9341 Metabolic encephalopathy: Secondary | ICD-10-CM | POA: Diagnosis present

## 2021-08-11 DIAGNOSIS — A4101 Sepsis due to Methicillin susceptible Staphylococcus aureus: Secondary | ICD-10-CM | POA: Diagnosis not present

## 2021-08-11 DIAGNOSIS — I10 Essential (primary) hypertension: Secondary | ICD-10-CM | POA: Diagnosis present

## 2021-08-11 DIAGNOSIS — J15211 Pneumonia due to Methicillin susceptible Staphylococcus aureus: Secondary | ICD-10-CM | POA: Diagnosis present

## 2021-08-11 DIAGNOSIS — Y732 Prosthetic and other implants, materials and accessory gastroenterology and urology devices associated with adverse incidents: Secondary | ICD-10-CM | POA: Diagnosis not present

## 2021-08-11 DIAGNOSIS — I110 Hypertensive heart disease with heart failure: Secondary | ICD-10-CM | POA: Diagnosis present

## 2021-08-11 DIAGNOSIS — R0603 Acute respiratory distress: Secondary | ICD-10-CM

## 2021-08-11 DIAGNOSIS — K9423 Gastrostomy malfunction: Secondary | ICD-10-CM | POA: Diagnosis present

## 2021-08-11 DIAGNOSIS — I69391 Dysphagia following cerebral infarction: Secondary | ICD-10-CM

## 2021-08-11 DIAGNOSIS — J449 Chronic obstructive pulmonary disease, unspecified: Secondary | ICD-10-CM | POA: Diagnosis present

## 2021-08-11 DIAGNOSIS — R197 Diarrhea, unspecified: Secondary | ICD-10-CM | POA: Diagnosis not present

## 2021-08-11 DIAGNOSIS — I5030 Unspecified diastolic (congestive) heart failure: Secondary | ICD-10-CM

## 2021-08-11 DIAGNOSIS — J9622 Acute and chronic respiratory failure with hypercapnia: Secondary | ICD-10-CM | POA: Diagnosis present

## 2021-08-11 DIAGNOSIS — Y838 Other surgical procedures as the cause of abnormal reaction of the patient, or of later complication, without mention of misadventure at the time of the procedure: Secondary | ICD-10-CM | POA: Diagnosis not present

## 2021-08-11 DIAGNOSIS — J4489 Other specified chronic obstructive pulmonary disease: Secondary | ICD-10-CM | POA: Diagnosis present

## 2021-08-11 DIAGNOSIS — R6521 Severe sepsis with septic shock: Secondary | ICD-10-CM | POA: Diagnosis present

## 2021-08-11 DIAGNOSIS — K5900 Constipation, unspecified: Secondary | ICD-10-CM | POA: Diagnosis present

## 2021-08-11 DIAGNOSIS — Z452 Encounter for adjustment and management of vascular access device: Secondary | ICD-10-CM

## 2021-08-11 DIAGNOSIS — E860 Dehydration: Secondary | ICD-10-CM | POA: Diagnosis present

## 2021-08-11 DIAGNOSIS — Z515 Encounter for palliative care: Secondary | ICD-10-CM | POA: Diagnosis not present

## 2021-08-11 DIAGNOSIS — Z6823 Body mass index (BMI) 23.0-23.9, adult: Secondary | ICD-10-CM

## 2021-08-11 DIAGNOSIS — I5032 Chronic diastolic (congestive) heart failure: Secondary | ICD-10-CM | POA: Diagnosis present

## 2021-08-11 DIAGNOSIS — Z7401 Bed confinement status: Secondary | ICD-10-CM

## 2021-08-11 DIAGNOSIS — Z7982 Long term (current) use of aspirin: Secondary | ICD-10-CM

## 2021-08-11 DIAGNOSIS — J690 Pneumonitis due to inhalation of food and vomit: Secondary | ICD-10-CM | POA: Diagnosis present

## 2021-08-11 DIAGNOSIS — E861 Hypovolemia: Secondary | ICD-10-CM | POA: Diagnosis present

## 2021-08-11 DIAGNOSIS — J15212 Pneumonia due to Methicillin resistant Staphylococcus aureus: Secondary | ICD-10-CM | POA: Diagnosis not present

## 2021-08-11 DIAGNOSIS — R0602 Shortness of breath: Secondary | ICD-10-CM | POA: Diagnosis not present

## 2021-08-11 DIAGNOSIS — E878 Other disorders of electrolyte and fluid balance, not elsewhere classified: Secondary | ICD-10-CM | POA: Diagnosis present

## 2021-08-11 DIAGNOSIS — Z7951 Long term (current) use of inhaled steroids: Secondary | ICD-10-CM

## 2021-08-11 DIAGNOSIS — R1310 Dysphagia, unspecified: Secondary | ICD-10-CM | POA: Diagnosis present

## 2021-08-11 DIAGNOSIS — G8929 Other chronic pain: Secondary | ICD-10-CM | POA: Diagnosis present

## 2021-08-11 DIAGNOSIS — R531 Weakness: Secondary | ICD-10-CM

## 2021-08-11 HISTORY — DX: Encounter for adjustment and management of vascular access device: Z45.2

## 2021-08-11 LAB — RESP PANEL BY RT-PCR (FLU A&B, COVID) ARPGX2
Influenza A by PCR: NEGATIVE
Influenza B by PCR: NEGATIVE
SARS Coronavirus 2 by RT PCR: NEGATIVE

## 2021-08-11 LAB — CBC WITH DIFFERENTIAL/PLATELET
Abs Immature Granulocytes: 0.12 10*3/uL — ABNORMAL HIGH (ref 0.00–0.07)
Abs Immature Granulocytes: 0.15 10*3/uL — ABNORMAL HIGH (ref 0.00–0.07)
Basophils Absolute: 0.1 10*3/uL (ref 0.0–0.1)
Basophils Absolute: 0.1 10*3/uL (ref 0.0–0.1)
Basophils Relative: 0 %
Basophils Relative: 0 %
Eosinophils Absolute: 0 10*3/uL (ref 0.0–0.5)
Eosinophils Absolute: 0 10*3/uL (ref 0.0–0.5)
Eosinophils Relative: 0 %
Eosinophils Relative: 0 %
HCT: 49.8 % — ABNORMAL HIGH (ref 36.0–46.0)
HCT: 49 % — ABNORMAL HIGH (ref 36.0–46.0)
Hemoglobin: 15.3 g/dL — ABNORMAL HIGH (ref 12.0–15.0)
Hemoglobin: 15.2 g/dL — ABNORMAL HIGH (ref 12.0–15.0)
Immature Granulocytes: 1 %
Immature Granulocytes: 1 %
Lymphocytes Relative: 15 %
Lymphocytes Relative: 25 %
Lymphs Abs: 3.4 10*3/uL (ref 0.7–4.0)
Lymphs Abs: 5.5 10*3/uL — ABNORMAL HIGH (ref 0.7–4.0)
MCH: 30.8 pg (ref 26.0–34.0)
MCH: 31 pg (ref 26.0–34.0)
MCHC: 30.7 g/dL (ref 30.0–36.0)
MCHC: 31 g/dL (ref 30.0–36.0)
MCV: 100.4 fL — ABNORMAL HIGH (ref 80.0–100.0)
MCV: 99.8 fL (ref 80.0–100.0)
Monocytes Absolute: 1.5 10*3/uL — ABNORMAL HIGH (ref 0.1–1.0)
Monocytes Absolute: 2.2 10*3/uL — ABNORMAL HIGH (ref 0.1–1.0)
Monocytes Relative: 7 %
Monocytes Relative: 10 %
Neutro Abs: 17.2 10*3/uL — ABNORMAL HIGH (ref 1.7–7.7)
Neutro Abs: 14.1 10*3/uL — ABNORMAL HIGH (ref 1.7–7.7)
Neutrophils Relative %: 77 %
Neutrophils Relative %: 64 %
Platelets: 146 10*3/uL — ABNORMAL LOW (ref 150–400)
Platelets: 165 10*3/uL (ref 150–400)
RBC: 4.96 MIL/uL (ref 3.87–5.11)
RBC: 4.91 MIL/uL (ref 3.87–5.11)
RDW: 13.8 % (ref 11.5–15.5)
RDW: 13.8 % (ref 11.5–15.5)
Smear Review: NORMAL
WBC: 22.2 10*3/uL — ABNORMAL HIGH (ref 4.0–10.5)
WBC: 22 10*3/uL — ABNORMAL HIGH (ref 4.0–10.5)
nRBC: 0.2 % (ref 0.0–0.2)
nRBC: 0.3 % — ABNORMAL HIGH (ref 0.0–0.2)

## 2021-08-11 LAB — GLUCOSE, CAPILLARY
Glucose-Capillary: 212 mg/dL — ABNORMAL HIGH (ref 70–99)
Glucose-Capillary: 177 mg/dL — ABNORMAL HIGH (ref 70–99)
Glucose-Capillary: 166 mg/dL — ABNORMAL HIGH (ref 70–99)

## 2021-08-11 LAB — TROPONIN I (HIGH SENSITIVITY)
Troponin I (High Sensitivity): 214 ng/L (ref <18)
Troponin I (High Sensitivity): 275 ng/L (ref <18)
Troponin I (High Sensitivity): 580 ng/L (ref <18)
Troponin I (High Sensitivity): 483 ng/L (ref <18)

## 2021-08-11 LAB — URINALYSIS, COMPLETE (UACMP) WITH MICROSCOPIC
Bacteria, UA: NONE SEEN
Bilirubin Urine: NEGATIVE
Glucose, UA: NEGATIVE mg/dL
Hgb urine dipstick: NEGATIVE
Ketones, ur: 5 mg/dL — AB
Nitrite: NEGATIVE
Protein, ur: NEGATIVE mg/dL
Specific Gravity, Urine: >1.046 — ABNORMAL HIGH (ref 1.005–1.030)
pH: 5 (ref 5.0–8.0)

## 2021-08-11 LAB — PROCALCITONIN: Procalcitonin: 0.88 ng/mL

## 2021-08-11 LAB — HEMOGLOBIN AND HEMATOCRIT, BLOOD
HCT: 41.5 % (ref 36.0–46.0)
Hemoglobin: 12.8 g/dL (ref 12.0–15.0)

## 2021-08-11 LAB — APTT: aPTT: 29 seconds (ref 24–36)

## 2021-08-11 LAB — BLOOD GAS, VENOUS
Acid-Base Excess: 0 mmol/L (ref 0.0–2.0)
Bicarbonate: 25.4 mmol/L (ref 20.0–28.0)
O2 Saturation: 84.1 %
Patient temperature: 37
pCO2, Ven: 43 mmHg — ABNORMAL LOW (ref 44–60)
pH, Ven: 7.38 (ref 7.25–7.43)
pO2, Ven: 53 mmHg — ABNORMAL HIGH (ref 32–45)

## 2021-08-11 LAB — MRSA NEXT GEN BY PCR, NASAL: MRSA by PCR Next Gen: DETECTED — AB

## 2021-08-11 LAB — LACTIC ACID, PLASMA
Lactic Acid, Venous: 3.8 mmol/L (ref 0.5–1.9)
Lactic Acid, Venous: 4 mmol/L (ref 0.5–1.9)
Lactic Acid, Venous: 2.7 mmol/L (ref 0.5–1.9)
Lactic Acid, Venous: 2.8 mmol/L (ref 0.5–1.9)

## 2021-08-11 LAB — PROTIME-INR
INR: 1.4 — ABNORMAL HIGH (ref 0.8–1.2)
Prothrombin Time: 17.2 seconds — ABNORMAL HIGH (ref 11.4–15.2)

## 2021-08-11 LAB — BRAIN NATRIURETIC PEPTIDE: B Natriuretic Peptide: 1355 pg/mL — ABNORMAL HIGH (ref 0.0–100.0)

## 2021-08-11 LAB — MAGNESIUM: Magnesium: 2.1 mg/dL (ref 1.7–2.4)

## 2021-08-11 LAB — PHOSPHORUS: Phosphorus: 3.8 mg/dL (ref 2.5–4.6)

## 2021-08-11 MED ORDER — PRAVASTATIN SODIUM 20 MG PO TABS
20.0000 mg | ORAL_TABLET | Freq: Every day | ORAL | Status: DC
  Administered 2021-08-11 – 2021-08-23 (×11): 20 mg
  Filled 2021-08-11 (×11): qty 1

## 2021-08-11 MED ORDER — BISACODYL 10 MG RE SUPP
10.0000 mg | Freq: Once | RECTAL | Status: AC
  Administered 2021-08-11: 10 mg via RECTAL
  Filled 2021-08-11: qty 1

## 2021-08-11 MED ORDER — VANCOMYCIN HCL 1500 MG/300ML IV SOLN
1500.0000 mg | Freq: Once | INTRAVENOUS | Status: AC
  Administered 2021-08-11: 1500 mg via INTRAVENOUS
  Filled 2021-08-11 (×2): qty 300

## 2021-08-11 MED ORDER — VASOPRESSIN 20 UNITS/100 ML INFUSION FOR SHOCK
0.0000 [IU]/min | INTRAVENOUS | Status: DC
  Administered 2021-08-11 – 2021-08-12 (×3): 0.03 [IU]/min via INTRAVENOUS
  Filled 2021-08-11 (×3): qty 100

## 2021-08-11 MED ORDER — INSULIN ASPART 100 UNIT/ML IJ SOLN
0.0000 [IU] | INTRAMUSCULAR | Status: DC
  Administered 2021-08-11: 2 [IU] via SUBCUTANEOUS
  Administered 2021-08-11: 3 [IU] via SUBCUTANEOUS
  Administered 2021-08-11: 2 [IU] via SUBCUTANEOUS
  Administered 2021-08-12: 1 [IU] via SUBCUTANEOUS
  Administered 2021-08-12 – 2021-08-13 (×2): 2 [IU] via SUBCUTANEOUS
  Administered 2021-08-13 – 2021-08-14 (×5): 1 [IU] via SUBCUTANEOUS
  Administered 2021-08-14: 2 [IU] via SUBCUTANEOUS
  Administered 2021-08-15 – 2021-08-17 (×6): 1 [IU] via SUBCUTANEOUS
  Administered 2021-08-17: 2 [IU] via SUBCUTANEOUS
  Administered 2021-08-17 (×2): 1 [IU] via SUBCUTANEOUS
  Administered 2021-08-17: 2 [IU] via SUBCUTANEOUS
  Administered 2021-08-18 – 2021-08-19 (×10): 1 [IU] via SUBCUTANEOUS
  Administered 2021-08-20: 3 [IU] via SUBCUTANEOUS
  Administered 2021-08-20 (×3): 1 [IU] via SUBCUTANEOUS
  Filled 2021-08-11 (×36): qty 1

## 2021-08-11 MED ORDER — MIDAZOLAM HCL 2 MG/2ML IJ SOLN: 2.0000 mg | INTRAMUSCULAR | Status: DC | PRN

## 2021-08-11 MED ORDER — KETAMINE HCL 10 MG/ML IJ SOLN
INTRAMUSCULAR | Status: AC | PRN
  Administered 2021-08-11: 75 mg via INTRAVENOUS

## 2021-08-11 MED ORDER — LACTATED RINGERS IV BOLUS
1000.0000 mL | Freq: Once | INTRAVENOUS | Status: AC
  Administered 2021-08-11: 1000 mL via INTRAVENOUS

## 2021-08-11 MED ORDER — ROCURONIUM BROMIDE 50 MG/5ML IV SOLN
INTRAVENOUS | Status: AC | PRN
Start: 2021-08-11 — End: 2021-08-11
  Administered 2021-08-11: 75 mg via INTRAVENOUS

## 2021-08-11 MED ORDER — LACTATED RINGERS IV SOLN: INTRAVENOUS | Status: AC

## 2021-08-11 MED ORDER — ACETAMINOPHEN 650 MG RE SUPP
650.0000 mg | Freq: Four times a day (QID) | RECTAL | Status: DC | PRN
  Administered 2021-08-11 – 2021-08-12 (×3): 650 mg via RECTAL
  Filled 2021-08-11 (×3): qty 1

## 2021-08-11 MED ORDER — ORAL CARE MOUTH RINSE
15.0000 mL | OROMUCOSAL | Status: DC
  Administered 2021-08-11 – 2021-08-12 (×9): 15 mL via OROMUCOSAL

## 2021-08-11 MED ORDER — MUPIROCIN 2 % EX OINT
1.0000 "application " | TOPICAL_OINTMENT | Freq: Two times a day (BID) | CUTANEOUS | Status: AC
  Administered 2021-08-11 – 2021-08-16 (×10): 1 via NASAL
  Filled 2021-08-11: qty 22

## 2021-08-11 MED ORDER — CHLORHEXIDINE GLUCONATE CLOTH 2 % EX PADS
6.0000 | MEDICATED_PAD | Freq: Every day | CUTANEOUS | Status: DC
  Administered 2021-08-11 – 2021-08-24 (×14): 6 via TOPICAL

## 2021-08-11 MED ORDER — SODIUM CHLORIDE 0.9 % IV SOLN
2.0000 g | Freq: Once | INTRAVENOUS | Status: AC
  Administered 2021-08-11: 2 g via INTRAVENOUS
  Filled 2021-08-11: qty 2

## 2021-08-11 MED ORDER — PHENYLEPHRINE HCL-NACL 20-0.9 MG/250ML-% IV SOLN
25.0000 ug/min | INTRAVENOUS | Status: DC
  Administered 2021-08-11: 125 ug/min via INTRAVENOUS
  Administered 2021-08-11: 25 ug/min via INTRAVENOUS
  Filled 2021-08-11 (×2): qty 250

## 2021-08-11 MED ORDER — NOREPINEPHRINE 16 MG/250ML-% IV SOLN
0.0000 ug/min | INTRAVENOUS | Status: DC
  Administered 2021-08-11: 22 ug/min via INTRAVENOUS
  Administered 2021-08-12: 14 ug/min via INTRAVENOUS
  Administered 2021-08-15: 3 ug/min via INTRAVENOUS
  Administered 2021-08-17 (×2): 2 ug/min via INTRAVENOUS
  Administered 2021-08-19: 4.053 ug/min via INTRAVENOUS
  Filled 2021-08-11 (×4): qty 250

## 2021-08-11 MED ORDER — PROPOFOL 1000 MG/100ML IV EMUL
INTRAVENOUS | Status: AC
  Administered 2021-08-11: 5 ug/kg/min
  Filled 2021-08-11: qty 100

## 2021-08-11 MED ORDER — BUDESONIDE 0.25 MG/2ML IN SUSP
0.2500 mg | Freq: Two times a day (BID) | RESPIRATORY_TRACT | Status: DC
  Administered 2021-08-11 – 2021-08-23 (×23): 0.25 mg via RESPIRATORY_TRACT
  Filled 2021-08-11 (×24): qty 2

## 2021-08-11 MED ORDER — NOREPINEPHRINE 4 MG/250ML-% IV SOLN
INTRAVENOUS | Status: AC
  Administered 2021-08-11: 4 mg
  Filled 2021-08-11: qty 250

## 2021-08-11 MED ORDER — CHLORHEXIDINE GLUCONATE 0.12% ORAL RINSE (MEDLINE KIT)
15.0000 mL | Freq: Two times a day (BID) | OROMUCOSAL | Status: DC
  Administered 2021-08-11 – 2021-08-12 (×2): 15 mL via OROMUCOSAL

## 2021-08-11 MED ORDER — SODIUM CHLORIDE 0.9 % IV SOLN
250.0000 mL | INTRAVENOUS | Status: DC
  Administered 2021-08-21: 250 mL via INTRAVENOUS

## 2021-08-11 MED ORDER — POLYETHYLENE GLYCOL 3350 17 G PO PACK
17.0000 g | PACK | Freq: Every day | ORAL | Status: DC
  Administered 2021-08-12 – 2021-08-14 (×3): 17 g
  Filled 2021-08-11 (×3): qty 1

## 2021-08-11 MED ORDER — IOHEXOL 300 MG/ML  SOLN
60.0000 mL | Freq: Once | INTRAMUSCULAR | Status: AC | PRN
  Administered 2021-08-11: 60 mL via INTRAVENOUS

## 2021-08-11 MED ORDER — IPRATROPIUM-ALBUTEROL 0.5-2.5 (3) MG/3ML IN SOLN
3.0000 mL | RESPIRATORY_TRACT | Status: DC
  Administered 2021-08-11 – 2021-08-13 (×11): 3 mL via RESPIRATORY_TRACT
  Filled 2021-08-11 (×11): qty 3

## 2021-08-11 MED ORDER — VALPROIC ACID 250 MG/5ML PO SOLN
125.0000 mg | Freq: Two times a day (BID) | ORAL | Status: DC
  Administered 2021-08-11 – 2021-08-15 (×8): 125 mg
  Filled 2021-08-11 (×10): qty 5

## 2021-08-11 MED ORDER — DOCUSATE SODIUM 50 MG/5ML PO LIQD
100.0000 mg | Freq: Two times a day (BID) | ORAL | Status: DC
  Administered 2021-08-11 – 2021-08-14 (×5): 100 mg
  Filled 2021-08-11 (×6): qty 10

## 2021-08-11 MED ORDER — DOCUSATE SODIUM 50 MG/5ML PO LIQD
100.0000 mg | Freq: Two times a day (BID) | ORAL | Status: DC | PRN
  Filled 2021-08-11: qty 10

## 2021-08-11 MED ORDER — FENTANYL BOLUS VIA INFUSION
50.0000 ug | INTRAVENOUS | Status: DC | PRN
  Filled 2021-08-11: qty 100

## 2021-08-11 MED ORDER — FAMOTIDINE 40 MG/5ML PO SUSR
20.0000 mg | Freq: Two times a day (BID) | ORAL | Status: AC
  Administered 2021-08-11 – 2021-08-21 (×17): 20 mg
  Filled 2021-08-11 (×24): qty 2.5

## 2021-08-11 MED ORDER — CITALOPRAM HYDROBROMIDE 20 MG PO TABS
10.0000 mg | ORAL_TABLET | Freq: Every day | ORAL | Status: DC
  Administered 2021-08-12 – 2021-08-24 (×11): 10 mg
  Filled 2021-08-11 (×11): qty 1

## 2021-08-11 MED ORDER — MIDAZOLAM-SODIUM CHLORIDE 100-0.9 MG/100ML-% IV SOLN
INTRAVENOUS | Status: AC
  Administered 2021-08-11: 3.3 mg/h
  Filled 2021-08-11: qty 100

## 2021-08-11 MED ORDER — SODIUM CHLORIDE 0.9 % IV SOLN
1.5000 g | Freq: Four times a day (QID) | INTRAVENOUS | Status: DC
  Administered 2021-08-11 – 2021-08-12 (×3): 1.5 g via INTRAVENOUS
  Filled 2021-08-11: qty 4
  Filled 2021-08-11 (×3): qty 1.5

## 2021-08-11 MED ORDER — VANCOMYCIN HCL IN DEXTROSE 1-5 GM/200ML-% IV SOLN: 1000.0000 mg | Freq: Once | INTRAVENOUS | Status: DC

## 2021-08-11 MED ORDER — FENTANYL CITRATE PF 50 MCG/ML IJ SOSY
50.0000 ug | PREFILLED_SYRINGE | Freq: Once | INTRAMUSCULAR | Status: AC
  Administered 2021-08-11: 50 ug via INTRAVENOUS
  Filled 2021-08-11: qty 1

## 2021-08-11 MED ORDER — POLYETHYLENE GLYCOL 3350 17 G PO PACK: 17.0000 g | PACK | Freq: Every day | ORAL | Status: DC | PRN

## 2021-08-11 MED ORDER — LACTATED RINGERS IV BOLUS
1000.0000 mL | Freq: Once | INTRAVENOUS | Status: AC
Start: 2021-08-11 — End: 2021-08-11
  Administered 2021-08-11: 1000 mL via INTRAVENOUS

## 2021-08-11 MED ORDER — TRANEXAMIC ACID FOR EPISTAXIS
500.0000 mg | Freq: Once | TOPICAL | Status: AC
  Administered 2021-08-11: 500 mg via TOPICAL
  Filled 2021-08-11: qty 10

## 2021-08-11 MED ORDER — FENTANYL 2500MCG IN NS 250ML (10MCG/ML) PREMIX INFUSION
50.0000 ug/h | INTRAVENOUS | Status: DC
  Administered 2021-08-11: 50 ug/h via INTRAVENOUS
  Administered 2021-08-12: 200 ug/h via INTRAVENOUS
  Filled 2021-08-11 (×2): qty 250

## 2021-08-11 MED ORDER — NOREPINEPHRINE 4 MG/250ML-% IV SOLN
INTRAVENOUS | Status: AC
  Administered 2021-08-11: 2 ug/min
  Filled 2021-08-11: qty 250

## 2021-08-11 NOTE — Consult Note (Signed)
PHARMACY -  BRIEF ANTIBIOTIC NOTE  ? ?Pharmacy has received consult(s) for vancomycin and cefepime from an ED provider.  The patient's profile has been reviewed for ht/wt/allergies/indication/available labs.   ? ?One time order(s) placed for Cefepime 2gm x1. Vancomycin 1.5gm x1. ? ?Further antibiotics/pharmacy consults should be ordered by admitting physician if indicated.       ?                ?Thank you, ?Selinda Eon ?08/11/2021  12:27 PM ? ?
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
## 2021-08-11 NOTE — Consult Note (Signed)
Pharmacy Antibiotic Note ? ?Kaitlyn Ruiz is a 66 y.o. female admitted on 08/11/2021 with  aspiration pneumonia .  Pharmacy has been consulted for Unasyn dosing. ? ?Plan: ?Unasyn 1.5gm every 6 hours ? ?Height: 5\' 6"  (167.6 cm) ?Weight: 65.7 kg (144 lb 13.5 oz) ?IBW/kg (Calculated) : 59.3 ? ?Temp (24hrs), Avg:101.1 ?F (38.4 ?C), Min:101.1 ?F (38.4 ?C), Max:101.1 ?F (38.4 ?C) ? ?Recent Labs  ?Lab 08/11/21 ?1206 08/11/21 ?1208  ?WBC 22.2*  --   ?LATICACIDVEN  --  3.8*  ?  ?Estimated Creatinine Clearance: 67.4 mL/min (by C-G formula based on SCr of 0.6 mg/dL).   ? ?No Known Allergies ? ?Antimicrobials this admission: ?3/14 Vancomycin, Cefepime x1 Ed ?3/14 Unasyn >>  ? ?Dose adjustments this admission: ? ? ?Microbiology results: ?3/14 BCx: sent ? ?Thank you for allowing pharmacy to be a part of this patient?s care. ? ?4/14 ?08/11/2021 2:55 PM ? ?

## 2021-08-11 NOTE — ED Notes (Signed)
VBG and repeat green top sent to lab at this time  ?

## 2021-08-11 NOTE — Progress Notes (Signed)
Admission questions and assessment limited due to pt being unable to interact. Intubated and sedated.  ?

## 2021-08-11 NOTE — ED Notes (Signed)
Dr. Vicente Males and respiratory at bedside for intubation.  ?

## 2021-08-11 NOTE — ED Notes (Signed)
Report called to ICU at this time.

## 2021-08-11 NOTE — Progress Notes (Signed)
Time out performed at bedside with NP for emergent central line placement. ?

## 2021-08-11 NOTE — Progress Notes (Signed)
An USGPIV (ultrasound guided PIV) has been placed  on the RAFA 20 g 1.88" for short-term vasopressor infusion. A correctly placed ivWatch must be used when administering Vasopressors. Should this treatment be needed beyond 72 hours, central line access should be obtained.  It will be the responsibility of the bedside nurse to follow best practice to prevent extravasations.   ?

## 2021-08-11 NOTE — Sepsis Progress Note (Signed)
Code Sepsis protocol being monitored by eLink. Pt intubated on arrival. Lactic acid and blood cultures have been drawn. Antibiotics ordered. LR at 150 ml/hr has been ordered. ?

## 2021-08-11 NOTE — Progress Notes (Signed)
GOALS OF CARE DISCUSSION ? ?The Clinical status was relayed to family in detail- ?Uncle over there phone ? ?Updated and notified of patients medical condition- ?Patient remains unresponsive and will not open eyes to command.   ?Patient is having a weak cough and struggling to remove secretions.   ?Patient with increased WOB and using accessory muscles to breathe ?Explained to family course of therapy and the modalities  ? ? ?Patient with Progressive multiorgan failure with a very high probablity of a very minimal chance of meaningful recovery despite all aggressive and optimal medical therapy.  ?PATIENT REMAINS FULL CODE ? ?Family understands the situation. ? ?RECOMMEND PALLIATIVE CARE CONSULTATION ?RECOMMEND DNR STATUS ? ?Family are satisfied with Plan of action and management. All questions answered ? ?Additional CC time 25 mins ? ? ?Corrin Parker, M.D.  ?[facility]  ?Medical Director [place] ?Medical Director [facility] Cardio-Pulmonary Department  ? ? ?

## 2021-08-11 NOTE — Procedures (Signed)
Central Venous Catheter Insertion Procedure Note ? ?SIDDHI DORNBUSH  ?[id number]  ?December 23, 1955 ? ?Date:08/11/21  ?Time:6:19 PM  ? ?Provider Performing:Necia Kamm Earnest Conroy  ? ?Procedure: Insertion of Non-tunneled Central Venous Catheter(36556) with US guidance (72536)  ? ?Indication(s) ?Medication administration and Difficult access ? ?Consent ?Unable to obtain consent due to emergent nature of procedure. ? ?Anesthesia ?Topical only with 1% lidocaine  ? ?Timeout ?Verified patient identification, verified procedure, site/side was marked, verified correct patient position, special equipment/implants available, medications/allergies/relevant history reviewed, required imaging and test results available. ? ?Sterile Technique ?Maximal sterile technique including full sterile barrier drape, hand hygiene, sterile gown, sterile gloves, mask, hair covering, sterile ultrasound probe cover (if used). ? ?Procedure Description ?Area of catheter insertion was cleaned with chlorhexidine and draped in sterile fashion.  With real-time ultrasound guidance a central venous catheter was placed into the right internal jugular vein. Nonpulsatile blood flow and easy flushing noted in all ports.  The catheter was sutured in place and sterile dressing applied. ? ?Complications/Tolerance ?None; patient tolerated the procedure well. ?Chest X-ray is ordered to verify placement for internal jugular or subclavian cannulation.   Chest x-ray is not ordered for femoral cannulation. ? ?EBL ?Minimal ? ?Specimen(s) ?None ? ?Zada Girt, AGNP  ?Pulmonary/Critical Care ?Pager [phone] (please enter 7 digits) ?PCCM Consult Pager [phone] (please enter 7 digits)  ?

## 2021-08-11 NOTE — Sepsis Progress Note (Addendum)
Notified bedside nurse of need to administer antibiotics and IV fluids. At this time, she does not have enough IV sites for these. Pressors and sedation are infusing in available sites. ? ?

## 2021-08-11 NOTE — Sepsis Progress Note (Signed)
Notified Dr Belia Heman of ED difficulty in obtaining enough IV access for antibiotics and fluids. Cefipime recently started. They will be re-assessing for fluid needs when she arrives to unit. ? ?

## 2021-08-12 ENCOUNTER — Inpatient Hospital Stay: Payer: Medicare Other

## 2021-08-12 ENCOUNTER — Inpatient Hospital Stay (HOSPITAL_COMMUNITY)
Admit: 2021-08-12 | Discharge: 2021-08-12 | Disposition: A | Discharge status: Home / Self Care | Payer: Medicare Other | Attending: Critical Care Medicine | Admitting: Critical Care Medicine

## 2021-08-12 DIAGNOSIS — Z789 Other specified health status: Secondary | ICD-10-CM | POA: Diagnosis not present

## 2021-08-12 DIAGNOSIS — J9601 Acute respiratory failure with hypoxia: Secondary | ICD-10-CM | POA: Diagnosis not present

## 2021-08-12 DIAGNOSIS — Z515 Encounter for palliative care: Secondary | ICD-10-CM

## 2021-08-12 DIAGNOSIS — F0390 Unspecified dementia without behavioral disturbance: Secondary | ICD-10-CM | POA: Diagnosis not present

## 2021-08-12 DIAGNOSIS — R0602 Shortness of breath: Secondary | ICD-10-CM | POA: Diagnosis not present

## 2021-08-12 DIAGNOSIS — R778 Other specified abnormalities of plasma proteins: Secondary | ICD-10-CM

## 2021-08-12 LAB — ECHOCARDIOGRAM COMPLETE
AR max vel: 1.16 cm2
AV Area VTI: 1.16 cm2
AV Area mean vel: 1.11 cm2
AV Mean grad: 2.3 mmHg
AV Peak grad: 3.8 mmHg
Ao pk vel: 0.98 m/s
Area-P 1/2: 4.93 cm2
Height: 66 in
MV VTI: 1.27 cm2
S' Lateral: 1.9 cm
Weight: 2564.39 oz

## 2021-08-12 LAB — GLUCOSE, CAPILLARY
Glucose-Capillary: 104 mg/dL — ABNORMAL HIGH (ref 70–99)
Glucose-Capillary: 115 mg/dL — ABNORMAL HIGH (ref 70–99)
Glucose-Capillary: 126 mg/dL — ABNORMAL HIGH (ref 70–99)
Glucose-Capillary: 167 mg/dL — ABNORMAL HIGH (ref 70–99)
Glucose-Capillary: 178 mg/dL — ABNORMAL HIGH (ref 70–99)
Glucose-Capillary: 90 mg/dL (ref 70–99)
Glucose-Capillary: 94 mg/dL (ref 70–99)

## 2021-08-12 LAB — HEPATIC FUNCTION PANEL
ALT: 33 U/L (ref 0–44)
AST: 57 U/L — ABNORMAL HIGH (ref 15–41)
Albumin: 2.1 g/dL — ABNORMAL LOW (ref 3.5–5.0)
Alkaline Phosphatase: 63 U/L (ref 38–126)
Bilirubin, Direct: <0.1 mg/dL (ref 0.0–0.2)
Total Bilirubin: 0.6 mg/dL (ref 0.3–1.2)
Total Protein: 7.2 g/dL (ref 6.5–8.1)

## 2021-08-12 LAB — BLOOD GAS, ARTERIAL
Acid-Base Excess: 0 mmol/L (ref 0.0–2.0)
Bicarbonate: 25.4 mmol/L (ref 20.0–28.0)
FIO2: 28 %
MECHVT: 450 mL
O2 Saturation: 99.7 %
PEEP: 5 cmH2O
Patient temperature: 37
RATE: 16 resp/min
pCO2 arterial: 43 mmHg (ref 32–48)
pH, Arterial: 7.38 (ref 7.35–7.45)
pO2, Arterial: 98 mmHg (ref 83–108)

## 2021-08-12 LAB — COMPREHENSIVE METABOLIC PANEL
ALT: 39 U/L (ref 0–44)
AST: 69 U/L — ABNORMAL HIGH (ref 15–41)
Albumin: 2.3 g/dL — ABNORMAL LOW (ref 3.5–5.0)
Alkaline Phosphatase: 78 U/L (ref 38–126)
Anion gap: 9 (ref 5–15)
BUN: 52 mg/dL — ABNORMAL HIGH (ref 8–23)
CO2: 24 mmol/L (ref 22–32)
Calcium: 8.3 mg/dL — ABNORMAL LOW (ref 8.9–10.3)
Chloride: 118 mmol/L — ABNORMAL HIGH (ref 98–111)
Creatinine, Ser: 1.01 mg/dL — ABNORMAL HIGH (ref 0.44–1.00)
GFR, Estimated: >60 mL/min (ref >60)
Glucose, Bld: 197 mg/dL — ABNORMAL HIGH (ref 70–99)
Potassium: 4.1 mmol/L (ref 3.5–5.1)
Sodium: 151 mmol/L — ABNORMAL HIGH (ref 135–145)
Total Bilirubin: 0.5 mg/dL (ref 0.3–1.2)
Total Protein: 8.7 g/dL — ABNORMAL HIGH (ref 6.5–8.1)

## 2021-08-12 LAB — BASIC METABOLIC PANEL
Anion gap: 1 — ABNORMAL LOW (ref 5–15)
BUN: 39 mg/dL — ABNORMAL HIGH (ref 8–23)
CO2: 27 mmol/L (ref 22–32)
Calcium: 7.5 mg/dL — ABNORMAL LOW (ref 8.9–10.3)
Chloride: 113 mmol/L — ABNORMAL HIGH (ref 98–111)
Creatinine, Ser: 0.68 mg/dL (ref 0.44–1.00)
GFR, Estimated: >60 mL/min (ref >60)
Glucose, Bld: 192 mg/dL — ABNORMAL HIGH (ref 70–99)
Potassium: 3.8 mmol/L (ref 3.5–5.1)
Sodium: 141 mmol/L (ref 135–145)

## 2021-08-12 LAB — CBC
HCT: 40.3 % (ref 36.0–46.0)
Hemoglobin: 12.5 g/dL (ref 12.0–15.0)
MCH: 30.9 pg (ref 26.0–34.0)
MCHC: 31 g/dL (ref 30.0–36.0)
MCV: 99.5 fL (ref 80.0–100.0)
Platelets: 152 10*3/uL (ref 150–400)
RBC: 4.05 MIL/uL (ref 3.87–5.11)
RDW: 13.4 % (ref 11.5–15.5)
WBC: 23.6 10*3/uL — ABNORMAL HIGH (ref 4.0–10.5)
nRBC: 0.2 % (ref 0.0–0.2)

## 2021-08-12 LAB — HIV ANTIBODY (ROUTINE TESTING W REFLEX): HIV Screen 4th Generation wRfx: NONREACTIVE

## 2021-08-12 LAB — PHOSPHORUS: Phosphorus: 3 mg/dL (ref 2.5–4.6)

## 2021-08-12 LAB — MAGNESIUM: Magnesium: 2.1 mg/dL (ref 1.7–2.4)

## 2021-08-12 LAB — HEMOGLOBIN A1C
Hgb A1c MFr Bld: 5.5 % (ref 4.8–5.6)
Mean Plasma Glucose: 111 mg/dL

## 2021-08-12 LAB — PROCALCITONIN: Procalcitonin: 0.81 ng/mL

## 2021-08-12 LAB — TROPONIN I (HIGH SENSITIVITY): Troponin I (High Sensitivity): 264 ng/L (ref <18)

## 2021-08-12 MED ORDER — LACTATED RINGERS IV BOLUS
1000.0000 mL | Freq: Once | INTRAVENOUS | Status: AC
Start: 2021-08-12 — End: 2021-08-12
  Administered 2021-08-12: 1000 mL via INTRAVENOUS

## 2021-08-12 MED ORDER — GABAPENTIN 250 MG/5ML PO SOLN
300.0000 mg | Freq: Three times a day (TID) | ORAL | Status: DC
  Administered 2021-08-12 – 2021-08-24 (×32): 300 mg
  Filled 2021-08-12 (×38): qty 6

## 2021-08-12 MED ORDER — JUVEN PO PACK
1.0000 | PACK | Freq: Two times a day (BID) | ORAL | Status: DC
  Administered 2021-08-13 – 2021-08-24 (×21): 1

## 2021-08-12 MED ORDER — ORAL CARE MOUTH RINSE
15.0000 mL | Freq: Two times a day (BID) | OROMUCOSAL | Status: DC
  Administered 2021-08-12 – 2021-08-24 (×23): 15 mL via OROMUCOSAL

## 2021-08-12 MED ORDER — SODIUM CHLORIDE 0.9 % IV SOLN
3.0000 g | Freq: Four times a day (QID) | INTRAVENOUS | Status: DC
  Administered 2021-08-12 – 2021-08-13 (×4): 3 g via INTRAVENOUS
  Filled 2021-08-12 (×3): qty 3
  Filled 2021-08-12: qty 8
  Filled 2021-08-12: qty 3

## 2021-08-12 MED ORDER — ENOXAPARIN SODIUM 40 MG/0.4ML IJ SOSY
40.0000 mg | PREFILLED_SYRINGE | Freq: Every day | INTRAMUSCULAR | Status: DC
  Administered 2021-08-12 – 2021-08-23 (×12): 40 mg via SUBCUTANEOUS
  Filled 2021-08-12 (×12): qty 0.4

## 2021-08-12 MED ORDER — BISACODYL 10 MG RE SUPP
10.0000 mg | Freq: Once | RECTAL | Status: AC
  Administered 2021-08-12: 10 mg via RECTAL
  Filled 2021-08-12: qty 1

## 2021-08-12 MED ORDER — FREE WATER
100.0000 mL | Status: DC
  Administered 2021-08-12 – 2021-08-15 (×18): 100 mL

## 2021-08-12 MED ORDER — GLUCERNA 1.5 CAL PO LIQD
1000.0000 mL | ORAL | Status: DC
  Administered 2021-08-12 – 2021-08-21 (×9): 1000 mL
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
## 2021-08-12 NOTE — Progress Notes (Signed)
PHARMACY NOTE:  ANTIMICROBIAL RENAL DOSAGE ADJUSTMENT ? ?Current antimicrobial regimen includes a mismatch between antimicrobial dosage and estimated renal function.  As per policy approved by the Pharmacy & Therapeutics and Medical Executive Committees, the antimicrobial dosage will be adjusted accordingly. ? ?Current antimicrobial dosage:  Unasyn 1.5 g IV q6h ? ?Indication: Aspiration pneumonia ? ?Renal Function: ? ?Estimated Creatinine Clearance: 73.5 mL/min (by C-G formula based on SCr of 0.68 mg/dL). ?   ?Antimicrobial dosage has been changed to:  Unasyn 3 g IV q6h ? ? ?Thank you for allowing pharmacy to be a part of this patient's care. ? ?Tressie Ellis, RPH ?08/12/2021 9:06 AM ? ? ?   ?
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
## 2021-08-12 NOTE — Progress Notes (Signed)
*  PRELIMINARY RESULTS* ?Echocardiogram ?2D Echocardiogram has been performed. ? ?[name], Sonia Side ?08/12/2021, 9:03 AM ?
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
## 2021-08-12 NOTE — Progress Notes (Signed)
GOALS OF CARE DISCUSSION ? ?The Clinical status was relayed to family in detail- ? ?Updated and notified of patients medical condition- ?Patient remains unresponsive and will not open eyes to command.   ?Patient is having a weak cough and struggling to remove secretions.   ?Patient with increased WOB and using accessory muscles to breathe ?Explained to family course of therapy and the modalities  ? ?Acute aspiration of blood causing asphyxiation and severe resp failure ?Plan for SAT/SBT ? ? ?Patient with Progressive multiorgan failure with a very high probablity of a very minimal chance of meaningful recovery despite all aggressive and optimal medical therapy.  ?PATIENT REMAINS FULL CODE ? ?Family understands the situation. ? ? ?Family are satisfied with Plan of action and management. All questions answered ? ?Additional CC time 25 mins ? ? ?Corrin Parker, M.D.  ?[facility]  ?Medical Director [place] ?Medical Director [facility] Cardio-Pulmonary Department  ? ? ?

## 2021-08-12 NOTE — Progress Notes (Signed)
Patient successfully extubated to 2L  per order, with no complicatons. Sats are 100% at this time. ?

## 2021-08-13 ENCOUNTER — Inpatient Hospital Stay: Payer: Medicare Other

## 2021-08-13 DIAGNOSIS — R0603 Acute respiratory distress: Secondary | ICD-10-CM

## 2021-08-13 LAB — CBC WITH DIFFERENTIAL/PLATELET
Abs Immature Granulocytes: 0.11 10*3/uL — ABNORMAL HIGH (ref 0.00–0.07)
Basophils Absolute: 0 10*3/uL (ref 0.0–0.1)
Basophils Relative: 0 %
Eosinophils Absolute: 0 10*3/uL (ref 0.0–0.5)
Eosinophils Relative: 0 %
HCT: 34.8 % — ABNORMAL LOW (ref 36.0–46.0)
Hemoglobin: 10.7 g/dL — ABNORMAL LOW (ref 12.0–15.0)
Immature Granulocytes: 1 %
Lymphocytes Relative: 21 %
Lymphs Abs: 3 10*3/uL (ref 0.7–4.0)
MCH: 30.9 pg (ref 26.0–34.0)
MCHC: 30.7 g/dL (ref 30.0–36.0)
MCV: 100.6 fL — ABNORMAL HIGH (ref 80.0–100.0)
Monocytes Absolute: 1 10*3/uL (ref 0.1–1.0)
Monocytes Relative: 7 %
Neutro Abs: 9.9 10*3/uL — ABNORMAL HIGH (ref 1.7–7.7)
Neutrophils Relative %: 71 %
Platelets: 88 10*3/uL — ABNORMAL LOW (ref 150–400)
RBC: 3.46 MIL/uL — ABNORMAL LOW (ref 3.87–5.11)
RDW: 13.5 % (ref 11.5–15.5)
Smear Review: NORMAL
WBC: 14.2 10*3/uL — ABNORMAL HIGH (ref 4.0–10.5)
nRBC: 0.2 % (ref 0.0–0.2)

## 2021-08-13 LAB — HEMOGLOBIN AND HEMATOCRIT, BLOOD
HCT: 36.2 % (ref 36.0–46.0)
Hemoglobin: 11.5 g/dL — ABNORMAL LOW (ref 12.0–15.0)

## 2021-08-13 LAB — PROTIME-INR
INR: 1.2 (ref 0.8–1.2)
Prothrombin Time: 15 seconds (ref 11.4–15.2)

## 2021-08-13 LAB — COMPREHENSIVE METABOLIC PANEL
ALT: 41 U/L (ref 0–44)
AST: 95 U/L — ABNORMAL HIGH (ref 15–41)
Albumin: 1.8 g/dL — ABNORMAL LOW (ref 3.5–5.0)
Alkaline Phosphatase: 68 U/L (ref 38–126)
Anion gap: 6 (ref 5–15)
BUN: 32 mg/dL — ABNORMAL HIGH (ref 8–23)
CO2: 29 mmol/L (ref 22–32)
Calcium: 7.8 mg/dL — ABNORMAL LOW (ref 8.9–10.3)
Chloride: 111 mmol/L (ref 98–111)
Creatinine, Ser: 0.59 mg/dL (ref 0.44–1.00)
GFR, Estimated: >60 mL/min (ref >60)
Glucose, Bld: 112 mg/dL — ABNORMAL HIGH (ref 70–99)
Potassium: 3.6 mmol/L (ref 3.5–5.1)
Sodium: 146 mmol/L — ABNORMAL HIGH (ref 135–145)
Total Bilirubin: 0.5 mg/dL (ref 0.3–1.2)
Total Protein: 6.4 g/dL — ABNORMAL LOW (ref 6.5–8.1)

## 2021-08-13 LAB — URINE CULTURE: Culture: NO GROWTH

## 2021-08-13 LAB — D-DIMER, QUANTITATIVE: D-Dimer, Quant: 1.88 ug/mL-FEU — ABNORMAL HIGH (ref 0.00–0.50)

## 2021-08-13 LAB — GLUCOSE, CAPILLARY
Glucose-Capillary: 95 mg/dL (ref 70–99)
Glucose-Capillary: 93 mg/dL (ref 70–99)
Glucose-Capillary: 93 mg/dL (ref 70–99)
Glucose-Capillary: 124 mg/dL — ABNORMAL HIGH (ref 70–99)
Glucose-Capillary: 131 mg/dL — ABNORMAL HIGH (ref 70–99)
Glucose-Capillary: 172 mg/dL — ABNORMAL HIGH (ref 70–99)

## 2021-08-13 LAB — PROCALCITONIN: Procalcitonin: 0.7 ng/mL

## 2021-08-13 LAB — FIBRINOGEN: Fibrinogen: 265 mg/dL (ref 210–475)

## 2021-08-13 LAB — PHOSPHORUS: Phosphorus: 1.7 mg/dL — ABNORMAL LOW (ref 2.5–4.6)

## 2021-08-13 LAB — MAGNESIUM: Magnesium: 2.2 mg/dL (ref 1.7–2.4)

## 2021-08-13 LAB — LACTIC ACID, PLASMA
Lactic Acid, Venous: 2.2 mmol/L (ref 0.5–1.9)
Lactic Acid, Venous: 2.4 mmol/L (ref 0.5–1.9)

## 2021-08-13 LAB — PATHOLOGIST SMEAR REVIEW

## 2021-08-13 LAB — APTT: aPTT: 30 seconds (ref 24–36)

## 2021-08-13 MED ORDER — LACTATED RINGERS IV BOLUS
1000.0000 mL | Freq: Once | INTRAVENOUS | Status: AC
  Administered 2021-08-13: 1000 mL via INTRAVENOUS

## 2021-08-13 MED ORDER — ACETAMINOPHEN 325 MG PO TABS
650.0000 mg | ORAL_TABLET | Freq: Four times a day (QID) | ORAL | Status: DC | PRN
  Administered 2021-08-13 – 2021-08-24 (×12): 650 mg
  Filled 2021-08-13 (×13): qty 2

## 2021-08-13 MED ORDER — AMOXICILLIN-POT CLAVULANATE 400-57 MG/5ML PO SUSR
800.0000 mg | Freq: Two times a day (BID) | ORAL | Status: AC
  Administered 2021-08-13 – 2021-08-23 (×18): 800 mg
  Filled 2021-08-13 (×23): qty 10

## 2021-08-13 MED ORDER — IPRATROPIUM-ALBUTEROL 0.5-2.5 (3) MG/3ML IN SOLN
3.0000 mL | Freq: Two times a day (BID) | RESPIRATORY_TRACT | Status: DC
  Administered 2021-08-13 – 2021-08-23 (×19): 3 mL via RESPIRATORY_TRACT
  Filled 2021-08-13 (×21): qty 3

## 2021-08-13 MED ORDER — FUROSEMIDE 10 MG/ML IJ SOLN
40.0000 mg | Freq: Once | INTRAMUSCULAR | Status: AC
  Administered 2021-08-13: 40 mg via INTRAVENOUS
  Filled 2021-08-13: qty 4

## 2021-08-13 MED ORDER — POTASSIUM & SODIUM PHOSPHATES 280-160-250 MG PO PACK
1.0000 | PACK | Freq: Three times a day (TID) | ORAL | Status: AC
  Administered 2021-08-13 (×3): 1
  Filled 2021-08-13 (×3): qty 1

## 2021-08-13 MED ORDER — MIDODRINE HCL 5 MG PO TABS
10.0000 mg | ORAL_TABLET | Freq: Three times a day (TID) | ORAL | Status: DC
  Administered 2021-08-13 – 2021-08-20 (×18): 10 mg
  Filled 2021-08-13 (×18): qty 2

## 2021-08-13 MED ORDER — LINEZOLID 600 MG PO TABS
600.0000 mg | ORAL_TABLET | Freq: Two times a day (BID) | ORAL | Status: DC
Start: 2021-08-13 — End: 2021-08-16
  Administered 2021-08-13 – 2021-08-15 (×5): 600 mg
  Filled 2021-08-13 (×8): qty 1

## 2021-08-13 MED ORDER — SODIUM CHLORIDE 3 % IN NEBU
4.0000 mL | INHALATION_SOLUTION | Freq: Once | RESPIRATORY_TRACT | Status: AC
  Administered 2021-08-13: 4 mL via RESPIRATORY_TRACT
  Filled 2021-08-13: qty 4
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
## 2021-08-13 NOTE — Progress Notes (Signed)
Progress note: ? ?After reviewing the patient's chart I assessed the patient at bedside.  No family present.  Patient acknowledges my presence but is nonverbal.  She is not able to make her needs know. AS per nursing, patient will swing with her right arm if attempts made to assess her.  ? ?Nursing at bedside also shares patient actually vocalized some words to her this morning.  Patient is in no apparent respiratory distress on 1 L of nasal cannula. ? ?Goals remain clear.  Full code and full scope as per patient's POA/sister. ? ?Palliative medicine team will shadow the patient's chart and intervene at family/medical team request or if patient status deteriorates. ? ?Kaitlyn Ruiz. Kaitlyn Fouche, DNP, FNP-BC ?Palliative Medicine Team ?Team Phone # [phone] ? ?NO CHARGE ?

## 2021-08-13 NOTE — Progress Notes (Addendum)
BRIEF PCCM NOTE ? ?Called to bedside by nursing as pt developed Acute Respiratory Distress (RR 30's with increased WOB and assessory muscle use) and Hypoxia with O2 sats 86 to 90% (FiO2 increased to 6L [place]).  Of note pt recently received 1L LR bolus for hypotension and midly elevated lactic acid fo 2.2.  Pt also noted to have thick tan looking secretions orally, however pt will not cooperate with thoroughly examining oral cavity, and WILL NOT let nursing staff perform oral/nasotracheal suctioning. ? ?BRIEF EXAMINATION: ?Respiratory: Diffuse rhonchi throughout upon ausculation, tachypnea, increased WOB and assessory muscle use ?Cardiac: Tachycardia, regular rhythm ?Neuro: unchanged, awake and alert, able to nod, but unable to obtain detailed ROS/Hx due to nonverbal at baseline ? ? ?ASSESSMENT / PLAN ? ?Acute on Chronic Hypoxic Respiratory Failure with Acute Respiratory Distress ?DDx: Acute Pulmonary Edema vs continued aspiration/inability to clear oral sections ?-Supplemental O2 as needed to maintain O2 sats >88% ?-Will transition to [facility] ?-Avoid BiPAP due to high risk of aspiration ?-High risk for reintubation ?-Follow intermittent Chest X-ray & ABG as needed ?-Bronchodilators & Pulmicort nebs ?-IV Steroids (start Solumedrol 20 mg BID) ?-ABX as above ?-Diuresis as BP and renal function permits ~ will give 40 mg IV Lasix x1 dose ?-Obtain repeat CXR ~ IMPRESSION: ?New extensive bilateral perihilar predominant airspace opacities, ?which could represent edema or multifocal pneumonia. ?-Chest PT via bed q4h ?-Hypertonic saline neb x1 ?-Aggressive Pulmonary toilet / Nasotracheal suctioning as able due to pt noncompliance ? ? ? ? ?Discussed plan with Dr. Mortimer Fries of which he is in agreement with.  Nursing and RT updated of plan at bedside. ? ? ?Additional Critical Care Time: 30 minutes ? ?Darel Hong, AGACNP-BC ?[facility] ?Prefer epic messenger for cross cover needs ?If after hours, please call  E-link ? ?

## 2021-08-13 NOTE — Progress Notes (Signed)
Updated pt's niece at bedside.  All questions answered. ? ? ?Harlon Ditty, AGACNP-BC ?[facility] ?Prefer epic messenger for cross cover needs ?If after hours, please call E-link ? ?

## 2021-08-14 ENCOUNTER — Encounter: Payer: Self-pay | Admitting: Internal Medicine

## 2021-08-14 ENCOUNTER — Other Ambulatory Visit: Payer: Self-pay

## 2021-08-14 ENCOUNTER — Inpatient Hospital Stay: Payer: Medicare Other

## 2021-08-14 DIAGNOSIS — J9601 Acute respiratory failure with hypoxia: Secondary | ICD-10-CM | POA: Diagnosis not present

## 2021-08-14 LAB — CORTISOL: Cortisol, Plasma: 13.1 ug/dL

## 2021-08-14 LAB — CULTURE, RESPIRATORY W GRAM STAIN: Gram Stain: NONE SEEN

## 2021-08-14 LAB — CBC WITH DIFFERENTIAL/PLATELET
Abs Immature Granulocytes: 0.17 10*3/uL — ABNORMAL HIGH (ref 0.00–0.07)
Basophils Absolute: 0.1 10*3/uL (ref 0.0–0.1)
Basophils Relative: 0 %
Eosinophils Absolute: 0 10*3/uL (ref 0.0–0.5)
Eosinophils Relative: 0 %
HCT: 36.6 % (ref 36.0–46.0)
Hemoglobin: 11.5 g/dL — ABNORMAL LOW (ref 12.0–15.0)
Immature Granulocytes: 1 %
Lymphocytes Relative: 25 %
Lymphs Abs: 5.2 10*3/uL — ABNORMAL HIGH (ref 0.7–4.0)
MCH: 30.7 pg (ref 26.0–34.0)
MCHC: 31.4 g/dL (ref 30.0–36.0)
MCV: 97.6 fL (ref 80.0–100.0)
Monocytes Absolute: 1.2 10*3/uL — ABNORMAL HIGH (ref 0.1–1.0)
Monocytes Relative: 6 %
Neutro Abs: 14.5 10*3/uL — ABNORMAL HIGH (ref 1.7–7.7)
Neutrophils Relative %: 68 %
Platelets: 155 10*3/uL (ref 150–400)
RBC: 3.75 MIL/uL — ABNORMAL LOW (ref 3.87–5.11)
RDW: 13.5 % (ref 11.5–15.5)
WBC: 21.1 10*3/uL — ABNORMAL HIGH (ref 4.0–10.5)
nRBC: 0.8 % — ABNORMAL HIGH (ref 0.0–0.2)

## 2021-08-14 LAB — COMPREHENSIVE METABOLIC PANEL
ALT: 42 U/L (ref 0–44)
AST: 90 U/L — ABNORMAL HIGH (ref 15–41)
Albumin: 1.7 g/dL — ABNORMAL LOW (ref 3.5–5.0)
Alkaline Phosphatase: 108 U/L (ref 38–126)
Anion gap: 5 (ref 5–15)
BUN: 33 mg/dL — ABNORMAL HIGH (ref 8–23)
CO2: 32 mmol/L (ref 22–32)
Calcium: 8 mg/dL — ABNORMAL LOW (ref 8.9–10.3)
Chloride: 109 mmol/L (ref 98–111)
Creatinine, Ser: 0.6 mg/dL (ref 0.44–1.00)
GFR, Estimated: >60 mL/min (ref >60)
Glucose, Bld: 159 mg/dL — ABNORMAL HIGH (ref 70–99)
Potassium: 3.8 mmol/L (ref 3.5–5.1)
Sodium: 146 mmol/L — ABNORMAL HIGH (ref 135–145)
Total Bilirubin: 0.4 mg/dL (ref 0.3–1.2)
Total Protein: 6.2 g/dL — ABNORMAL LOW (ref 6.5–8.1)

## 2021-08-14 LAB — GLUCOSE, CAPILLARY
Glucose-Capillary: 107 mg/dL — ABNORMAL HIGH (ref 70–99)
Glucose-Capillary: 123 mg/dL — ABNORMAL HIGH (ref 70–99)
Glucose-Capillary: 131 mg/dL — ABNORMAL HIGH (ref 70–99)
Glucose-Capillary: 135 mg/dL — ABNORMAL HIGH (ref 70–99)
Glucose-Capillary: 141 mg/dL — ABNORMAL HIGH (ref 70–99)
Glucose-Capillary: 151 mg/dL — ABNORMAL HIGH (ref 70–99)

## 2021-08-14 LAB — PHOSPHORUS: Phosphorus: 2.4 mg/dL — ABNORMAL LOW (ref 2.5–4.6)

## 2021-08-14 LAB — MAGNESIUM: Magnesium: 2.2 mg/dL (ref 1.7–2.4)

## 2021-08-14 MED ORDER — SODIUM CHLORIDE 3 % IN NEBU
4.0000 mL | INHALATION_SOLUTION | Freq: Once | RESPIRATORY_TRACT | Status: AC
  Administered 2021-08-14: 4 mL via RESPIRATORY_TRACT
  Filled 2021-08-14: qty 4

## 2021-08-14 MED ORDER — POTASSIUM & SODIUM PHOSPHATES 280-160-250 MG PO PACK
1.0000 | PACK | Freq: Three times a day (TID) | ORAL | Status: AC
  Administered 2021-08-14 (×3): 1
  Filled 2021-08-14 (×3): qty 1

## 2021-08-14 MED ORDER — FUROSEMIDE 10 MG/ML IJ SOLN
40.0000 mg | Freq: Once | INTRAMUSCULAR | Status: AC
Start: 2021-08-14 — End: 2021-08-14
  Administered 2021-08-14: 40 mg via INTRAVENOUS

## 2021-08-14 MED ORDER — IPRATROPIUM-ALBUTEROL 0.5-2.5 (3) MG/3ML IN SOLN
3.0000 mL | Freq: Once | RESPIRATORY_TRACT | Status: AC
  Administered 2021-08-14: 3 mL via RESPIRATORY_TRACT

## 2021-08-14 MED ORDER — ALBUMIN HUMAN 25 % IV SOLN
25.0000 g | Freq: Two times a day (BID) | INTRAVENOUS | Status: DC
  Administered 2021-08-14 – 2021-08-15 (×4): 25 g via INTRAVENOUS
  Filled 2021-08-14 (×4): qty 100

## 2021-08-14 MED ORDER — FUROSEMIDE 10 MG/ML IJ SOLN
INTRAMUSCULAR | Status: AC
  Filled 2021-08-14: qty 4
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
## 2021-08-14 NOTE — Plan of Care (Signed)
Discussed with patient in front of family plan of care for the evening, pain management and breathing with some teach back displayed.  MD ordered lasix IV and HHFNC restarted by RT. ?

## 2021-08-14 NOTE — TOC Initial Note (Signed)
Transition of Care (TOC) - Initial/Assessment Note  ? ? ?Patient Details  ?Name: Kaitlyn Ruiz ?MRN: [id number] ?Date of Birth: 10-01-1955 ? ?Transition of Care (TOC) CM/SW Contact:    ?Shelbie Hutching, RN ?Phone Number: ?08/14/2021, 10:16 AM ? ?Clinical Narrative:                 ?Patient admitted to the hospital with acute respiratory failure initially requiring intubation, extubated yesterday and tolerating HFNC 40L 35%.  History of stroke, nonverbal and total care, depends on Peg tube feedings for nutrition.  Patient is long term care at [facility]. ? ?Patient is currently in the ICU still on pressors and HF oxygen.   ?TOC will cont to follow.  ? ?Expected Discharge Plan: [place] ?Barriers to Discharge: Continued Medical Work up ? ? ?Patient Goals and CMS Choice ?Patient states their goals for this hospitalization and ongoing recovery are:: plan to return to [employer] ?  ?  ? ?Expected Discharge Plan and Services ?Expected Discharge Plan: [place] ?  ?Discharge Planning Services: CM Consult ?Post Acute Care Choice: Resumption of Svcs/PTA Provider, [place] ?Living arrangements for the past 2 months: [place] ?                ?DME Arranged: N/A ?DME Agency: NA ?  ?  ?  ?HH Arranged: NA ?  ?  ?  ?  ? ?Prior Living Arrangements/Services ?Living arrangements for the past 2 months: [place] ?Lives with:: Facility Resident ?Patient language and need for interpreter reviewed:: Yes ?Do you feel safe going back to the place where you live?: Yes      ?Need for Family Participation in Patient Care: Yes (Comment) ?Care giver support system in place?: Yes (comment) (SNF) ?  ?Criminal Activity/Legal Involvement Pertinent to Current Situation/Hospitalization: No - Comment as needed ? ?Activities of Daily Living ?  ?  ? ?Permission Sought/Granted ?Permission sought to share information with : Case Manager, Family Supports, Programmer, applications ?Permission granted to share information with : Yes, Verbal Permission Granted ? Share Information with NAME: [name] Benbow ? Permission granted to share info w AGENCY: [employer] ? Permission granted to share info w Relationship: sister ? Permission granted to share info w Contact Information: [phone] ? ?Emotional Assessment ?Appearance:: Appears stated age ?Attitude/Demeanor/Rapport: Unable to Assess ?Affect (typically observed): Unable to Assess ?  ?Alcohol / Substance Use: Not Applicable ?Psych Involvement: No (comment) ? ?Admission diagnosis:  Acute respiratory failure ([place]) [J96.00] ?Difficult intubation [T88.4XXA] ?Patient Active Problem List  ? Diagnosis Date Noted  ? Acute respiratory distress   ? Acute respiratory failure ([place]) 08/11/2021  ? Encounter for central line placement   ? Cerebral artery occlusion with cerebral infarction ([place]) 08/04/2021  ? Malfunction of percutaneous endoscopic gastrostomy (PEG) tube ([place]) 08/03/2021  ? Chronic hepatitis C ([place]) 08/03/2021  ? Depression 08/03/2021  ? Tobacco use disorder 08/03/2021  ? 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 Acute on chronic respiratory failure with hypoxia ([place]) 05/26/2019  ? COPD with chronic bronchitis ([place]) 05/26/2019  ? Non-insulin dependent type 2 diabetes mellitus ([place]) 05/26/2019  ? Hyponatremia 05/26/2019  ? UTI (urinary tract infection) 05/26/2019  ? 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 Dysphagia as late effect of cerebrovascular accident (CVA) 05/26/2019  ? Appetite impaired 08/15/2018  ? Anorexia 06/05/2018  ? Weakness generalized 06/05/2018  ? Esophageal dysphagia   ? Oropharyngeal dysphagia   ?  Neurological dysfunction   ? Endotracheal tube present   ? Palliative care encounter   ? Acute respiratory failure with hypoxemia ([place]) 05/14/2018  ? Acute metabolic encephalopathy   ? Septic shock ([place]) 04/10/2017  ? HTN (hypertension) 12/28/2013   ? ?PCP:  Marco Collie, MD ?Pharmacy:   ?[facility] [place] ?[id number] ?[place] [address] ?Phone: [phone] Fax: [phone] ? ? ? ? ?Social Determinants of Health (SDOH) Interventions ?  ? ?Readmission Risk Interventions ?Readmission Risk Prevention Plan 08/14/2021  ?Transportation Screening Complete  ?PCP or Specialist Appt within 3-5 Days Complete  ?[place] or Home Care Consult Complete  ?Social Work Consult for [place] Planning/Counseling Complete  ?Medication Review Press photographer) Complete  ?Some recent data might be hidden  ? ? ? ?

## 2021-08-15 ENCOUNTER — Inpatient Hospital Stay: Payer: Medicare Other

## 2021-08-15 DIAGNOSIS — J9601 Acute respiratory failure with hypoxia: Secondary | ICD-10-CM | POA: Diagnosis not present

## 2021-08-15 LAB — CBC WITH DIFFERENTIAL/PLATELET
Abs Immature Granulocytes: 0.08 10*3/uL — ABNORMAL HIGH (ref 0.00–0.07)
Basophils Absolute: 0.1 10*3/uL (ref 0.0–0.1)
Basophils Relative: 0 %
Eosinophils Absolute: 0.1 10*3/uL (ref 0.0–0.5)
Eosinophils Relative: 1 %
HCT: 33.4 % — ABNORMAL LOW (ref 36.0–46.0)
Hemoglobin: 10.6 g/dL — ABNORMAL LOW (ref 12.0–15.0)
Immature Granulocytes: 1 %
Lymphocytes Relative: 31 %
Lymphs Abs: 4.8 10*3/uL — ABNORMAL HIGH (ref 0.7–4.0)
MCH: 31.4 pg (ref 26.0–34.0)
MCHC: 31.7 g/dL (ref 30.0–36.0)
MCV: 98.8 fL (ref 80.0–100.0)
Monocytes Absolute: 0.7 10*3/uL (ref 0.1–1.0)
Monocytes Relative: 5 %
Neutro Abs: 9.8 10*3/uL — ABNORMAL HIGH (ref 1.7–7.7)
Neutrophils Relative %: 62 %
Platelets: 141 10*3/uL — ABNORMAL LOW (ref 150–400)
RBC: 3.38 MIL/uL — ABNORMAL LOW (ref 3.87–5.11)
RDW: 13.4 % (ref 11.5–15.5)
WBC: 15.5 10*3/uL — ABNORMAL HIGH (ref 4.0–10.5)
nRBC: 0.5 % — ABNORMAL HIGH (ref 0.0–0.2)

## 2021-08-15 LAB — THYROID PANEL WITH TSH
Free Thyroxine Index: 2.1 (ref 1.2–4.9)
T3 Uptake Ratio: 31 % (ref 24–39)
T4, Total: 6.7 ug/dL (ref 4.5–12.0)
TSH: 3.24 u[IU]/mL (ref 0.450–4.500)

## 2021-08-15 LAB — COMPREHENSIVE METABOLIC PANEL
ALT: 36 U/L (ref 0–44)
AST: 68 U/L — ABNORMAL HIGH (ref 15–41)
Albumin: 2.8 g/dL — ABNORMAL LOW (ref 3.5–5.0)
Alkaline Phosphatase: 81 U/L (ref 38–126)
Anion gap: 7 (ref 5–15)
BUN: 27 mg/dL — ABNORMAL HIGH (ref 8–23)
CO2: 33 mmol/L — ABNORMAL HIGH (ref 22–32)
Calcium: 8.2 mg/dL — ABNORMAL LOW (ref 8.9–10.3)
Chloride: 106 mmol/L (ref 98–111)
Creatinine, Ser: 0.5 mg/dL (ref 0.44–1.00)
GFR, Estimated: >60 mL/min (ref >60)
Glucose, Bld: 173 mg/dL — ABNORMAL HIGH (ref 70–99)
Potassium: 3.5 mmol/L (ref 3.5–5.1)
Sodium: 146 mmol/L — ABNORMAL HIGH (ref 135–145)
Total Bilirubin: 0.6 mg/dL (ref 0.3–1.2)
Total Protein: 6.1 g/dL — ABNORMAL LOW (ref 6.5–8.1)

## 2021-08-15 LAB — BRAIN NATRIURETIC PEPTIDE: B Natriuretic Peptide: 1983.9 pg/mL — ABNORMAL HIGH (ref 0.0–100.0)

## 2021-08-15 LAB — GLUCOSE, CAPILLARY
Glucose-Capillary: 147 mg/dL — ABNORMAL HIGH (ref 70–99)
Glucose-Capillary: 118 mg/dL — ABNORMAL HIGH (ref 70–99)
Glucose-Capillary: 125 mg/dL — ABNORMAL HIGH (ref 70–99)
Glucose-Capillary: 144 mg/dL — ABNORMAL HIGH (ref 70–99)
Glucose-Capillary: 138 mg/dL — ABNORMAL HIGH (ref 70–99)
Glucose-Capillary: 157 mg/dL — ABNORMAL HIGH (ref 70–99)

## 2021-08-15 LAB — MAGNESIUM: Magnesium: 2 mg/dL (ref 1.7–2.4)

## 2021-08-15 LAB — PROCALCITONIN: Procalcitonin: 0.5 ng/mL

## 2021-08-15 LAB — PHOSPHORUS: Phosphorus: 2.7 mg/dL (ref 2.5–4.6)

## 2021-08-15 LAB — LACTIC ACID, PLASMA: Lactic Acid, Venous: 1.7 mmol/L (ref 0.5–1.9)

## 2021-08-15 MED ORDER — FREE WATER
200.0000 mL | Status: DC
  Administered 2021-08-15 (×2): 200 mL

## 2021-08-15 MED ORDER — FAMOTIDINE IN NACL 20-0.9 MG/50ML-% IV SOLN
20.0000 mg | Freq: Once | INTRAVENOUS | Status: AC
  Administered 2021-08-15: 20 mg via INTRAVENOUS
  Filled 2021-08-15: qty 50

## 2021-08-15 MED ORDER — POTASSIUM CHLORIDE 20 MEQ PO PACK
40.0000 meq | PACK | Freq: Once | ORAL | Status: AC
  Administered 2021-08-15: 40 meq
  Filled 2021-08-15: qty 2

## 2021-08-15 MED ORDER — FUROSEMIDE 10 MG/ML IJ SOLN
20.0000 mg | Freq: Two times a day (BID) | INTRAMUSCULAR | Status: DC
  Administered 2021-08-15 – 2021-08-16 (×2): 20 mg via INTRAVENOUS
  Filled 2021-08-15 (×2): qty 2

## 2021-08-15 MED ORDER — MORPHINE SULFATE (PF) 2 MG/ML IV SOLN
INTRAVENOUS | Status: AC
  Administered 2021-08-15: 2 mg via INTRAVENOUS
  Filled 2021-08-15: qty 1

## 2021-08-15 MED ORDER — VALPROATE SODIUM 100 MG/ML IV SOLN
125.0000 mg | Freq: Once | INTRAVENOUS | Status: AC
  Administered 2021-08-16: 125 mg via INTRAVENOUS
  Filled 2021-08-15: qty 1.25

## 2021-08-15 MED ORDER — HYDROCORTISONE SOD SUC (PF) 100 MG IJ SOLR
75.0000 mg | Freq: Two times a day (BID) | INTRAMUSCULAR | Status: DC
  Administered 2021-08-15 – 2021-08-18 (×7): 75 mg via INTRAVENOUS
  Filled 2021-08-15 (×7): qty 2

## 2021-08-15 MED ORDER — NUTRISOURCE FIBER PO PACK
1.0000 | PACK | Freq: Two times a day (BID) | ORAL | Status: DC
  Administered 2021-08-17 – 2021-08-24 (×14): 1
  Filled 2021-08-15 (×19): qty 1

## 2021-08-15 MED ORDER — SODIUM CHLORIDE 0.9% FLUSH: 10.0000 mL | INTRAVENOUS | Status: DC | PRN

## 2021-08-15 MED ORDER — SODIUM CHLORIDE 0.9% FLUSH
10.0000 mL | Freq: Two times a day (BID) | INTRAVENOUS | Status: DC
  Administered 2021-08-15: 40 mL
  Administered 2021-08-16 – 2021-08-24 (×17): 10 mL

## 2021-08-15 MED ORDER — FUROSEMIDE 10 MG/ML IJ SOLN
40.0000 mg | INTRAMUSCULAR | Status: AC
  Administered 2021-08-15: 40 mg via INTRAVENOUS
  Filled 2021-08-15: qty 4

## 2021-08-15 MED ORDER — MORPHINE SULFATE (PF) 2 MG/ML IV SOLN
2.0000 mg | Freq: Once | INTRAVENOUS | Status: AC
  Filled 2021-08-15: qty 1

## 2021-08-15 MED ORDER — LINEZOLID 600 MG/300ML IV SOLN
600.0000 mg | Freq: Once | INTRAVENOUS | Status: AC
  Administered 2021-08-15: 600 mg via INTRAVENOUS
  Filled 2021-08-15: qty 300

## 2021-08-15 MED ORDER — DIATRIZOATE MEGLUMINE & SODIUM 66-10 % PO SOLN
30.0000 mL | Freq: Once | ORAL | Status: AC
  Administered 2021-08-15: 30 mL

## 2021-08-15 MED ORDER — FUROSEMIDE 10 MG/ML IJ SOLN: 20.0000 mg | Freq: Two times a day (BID) | INTRAMUSCULAR | Status: DC

## 2021-08-15 MED ORDER — MORPHINE SULFATE (PF) 2 MG/ML IV SOLN: 2.0000 mg | INTRAVENOUS | Status: AC

## 2021-08-15 NOTE — Plan of Care (Signed)
Discussed in front of patient and family plan of care for the evening, pain management and dental care with some teach back displayed.  Family concerned about her returning to the same SNF and want to talk to social worker about a possible change.  Family wants to know if a mouth care order can be written for discharge to facility to make sure it is getting done. ? ?Problem: Education: ?Goal: Knowledge of General Education information will improve ?Description: Including pain rating scale, medication(s)/side effects and non-pharmacologic comfort measures ?Outcome: Progressing ?  ?Problem: Health Behavior/Discharge Planning: ?Goal: Ability to manage health-related needs will improve ?Outcome: Progressing ?  ?

## 2021-08-15 NOTE — Progress Notes (Signed)
PEG tube became dislodged.  It is a 16 FR 20 cc  PEG Tube which has been saved.  Patient has tenderness and pain at the site with no redness.  MD made aware ?
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
## 2021-08-15 NOTE — Progress Notes (Signed)
Called bedside by nursing, PEG tube found completely dislodged, unclear how long it had been dislodged. Sterile gauze placed.  ?Per chart review this is the 6th dislodged PEG tube since 11/2020. The PEG tube was replaced in 08/2019. ? ?Spoke with General Surgeon on-call, Dr. Aleen Campi, for recommendations.  ?Via video-chat assistance, attempted to replace 16 fr. PEG tube bedside, after site and PEG tube thoroughly cleaned. However 16 fr. unable to pass. Dr. Aleen Campi recommended a 14 fr. Foley catheter. This was obtained and passed through successfully to stomach which was confirmed with KUB/contrast to keep tract open overnight. IV morphine given for pain control. Vitals stable, no complications. ? ?- Will consult IR in AM to replace PEG tube ?- NPO overnight, keep catheter clamped. PO medications adjusted to one-time IV doses. ?- called and updated the patient's sister Ms. Bruna Potter, all questions and concerns answered at this time. ? ? ?Cheryll Cockayne Rust-Chester, AGACNP-BC ?Acute Care Nurse Practitioner ?[facility]  ? ?[phone] / [phone] ?Please see Amion for pager details.  ? ?
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
## 2021-08-16 ENCOUNTER — Inpatient Hospital Stay: Payer: Medicare Other

## 2021-08-16 DIAGNOSIS — J9601 Acute respiratory failure with hypoxia: Secondary | ICD-10-CM | POA: Diagnosis not present

## 2021-08-16 LAB — CBC WITH DIFFERENTIAL/PLATELET
Abs Immature Granulocytes: 0.12 10*3/uL — ABNORMAL HIGH (ref 0.00–0.07)
Basophils Absolute: 0 10*3/uL (ref 0.0–0.1)
Basophils Relative: 0 %
Eosinophils Absolute: 0 10*3/uL (ref 0.0–0.5)
Eosinophils Relative: 0 %
HCT: 34.8 % — ABNORMAL LOW (ref 36.0–46.0)
Hemoglobin: 11 g/dL — ABNORMAL LOW (ref 12.0–15.0)
Immature Granulocytes: 1 %
Lymphocytes Relative: 13 %
Lymphs Abs: 2.9 10*3/uL (ref 0.7–4.0)
MCH: 30.9 pg (ref 26.0–34.0)
MCHC: 31.6 g/dL (ref 30.0–36.0)
MCV: 97.8 fL (ref 80.0–100.0)
Monocytes Absolute: 0.7 10*3/uL (ref 0.1–1.0)
Monocytes Relative: 3 %
Neutro Abs: 19.2 10*3/uL — ABNORMAL HIGH (ref 1.7–7.7)
Neutrophils Relative %: 83 %
Platelets: 172 10*3/uL (ref 150–400)
RBC: 3.56 MIL/uL — ABNORMAL LOW (ref 3.87–5.11)
RDW: 13.3 % (ref 11.5–15.5)
WBC: 22.9 10*3/uL — ABNORMAL HIGH (ref 4.0–10.5)
nRBC: 0.2 % (ref 0.0–0.2)

## 2021-08-16 LAB — CULTURE, BLOOD (ROUTINE X 2)
Culture: NO GROWTH
Culture: NO GROWTH

## 2021-08-16 LAB — COMPREHENSIVE METABOLIC PANEL
ALT: 34 U/L (ref 0–44)
AST: 51 U/L — ABNORMAL HIGH (ref 15–41)
Albumin: 3.4 g/dL — ABNORMAL LOW (ref 3.5–5.0)
Alkaline Phosphatase: 59 U/L (ref 38–126)
Anion gap: 5 (ref 5–15)
BUN: 25 mg/dL — ABNORMAL HIGH (ref 8–23)
CO2: 34 mmol/L — ABNORMAL HIGH (ref 22–32)
Calcium: 8.9 mg/dL (ref 8.9–10.3)
Chloride: 109 mmol/L (ref 98–111)
Creatinine, Ser: 0.57 mg/dL (ref 0.44–1.00)
GFR, Estimated: >60 mL/min (ref >60)
Glucose, Bld: 156 mg/dL — ABNORMAL HIGH (ref 70–99)
Potassium: 3.8 mmol/L (ref 3.5–5.1)
Sodium: 148 mmol/L — ABNORMAL HIGH (ref 135–145)
Total Bilirubin: 0.8 mg/dL (ref 0.3–1.2)
Total Protein: 6.8 g/dL (ref 6.5–8.1)

## 2021-08-16 LAB — PROCALCITONIN: Procalcitonin: 0.43 ng/mL

## 2021-08-16 LAB — GLUCOSE, CAPILLARY
Glucose-Capillary: 148 mg/dL — ABNORMAL HIGH (ref 70–99)
Glucose-Capillary: 130 mg/dL — ABNORMAL HIGH (ref 70–99)
Glucose-Capillary: 119 mg/dL — ABNORMAL HIGH (ref 70–99)
Glucose-Capillary: 119 mg/dL — ABNORMAL HIGH (ref 70–99)
Glucose-Capillary: 118 mg/dL — ABNORMAL HIGH (ref 70–99)

## 2021-08-16 LAB — PHOSPHORUS: Phosphorus: 2.9 mg/dL (ref 2.5–4.6)

## 2021-08-16 LAB — MAGNESIUM: Magnesium: 2 mg/dL (ref 1.7–2.4)

## 2021-08-16 MED ORDER — FUROSEMIDE 10 MG/ML IJ SOLN
40.0000 mg | INTRAMUSCULAR | Status: DC
  Filled 2021-08-16: qty 4

## 2021-08-16 MED ORDER — FUROSEMIDE 10 MG/ML IJ SOLN: 20.0000 mg | INTRAMUSCULAR | Status: DC

## 2021-08-16 MED ORDER — FUROSEMIDE 10 MG/ML IJ SOLN
40.0000 mg | Freq: Every day | INTRAMUSCULAR | Status: DC
Start: 2021-08-17 — End: 2021-08-17
  Administered 2021-08-17: 40 mg via INTRAVENOUS
  Filled 2021-08-16: qty 4

## 2021-08-16 MED ORDER — VALPROATE SODIUM 100 MG/ML IV SOLN
125.0000 mg | Freq: Two times a day (BID) | INTRAVENOUS | Status: DC
  Administered 2021-08-16 – 2021-08-17 (×3): 125 mg via INTRAVENOUS
  Filled 2021-08-16 (×5): qty 1.25

## 2021-08-16 MED ORDER — DEXTROSE 5 % IV SOLN: INTRAVENOUS | Status: DC

## 2021-08-16 MED ORDER — FUROSEMIDE 10 MG/ML IJ SOLN
20.0000 mg | Freq: Once | INTRAMUSCULAR | Status: AC
  Administered 2021-08-16: 20 mg via INTRAVENOUS

## 2021-08-16 MED ORDER — POTASSIUM CHLORIDE 20 MEQ PO PACK: 20.0000 meq | PACK | Freq: Once | ORAL | Status: DC

## 2021-08-16 MED ORDER — FREE WATER
250.0000 mL | Status: DC
Start: 2021-08-16 — End: 2021-08-17

## 2021-08-16 MED ORDER — POTASSIUM CHLORIDE 10 MEQ/50ML IV SOLN
10.0000 meq | INTRAVENOUS | Status: AC
  Administered 2021-08-16 (×2): 10 meq via INTRAVENOUS
  Filled 2021-08-16 (×2): qty 50

## 2021-08-16 MED ORDER — LINEZOLID 600 MG/300ML IV SOLN
600.0000 mg | Freq: Two times a day (BID) | INTRAVENOUS | Status: AC
  Administered 2021-08-16 – 2021-08-17 (×3): 600 mg via INTRAVENOUS
  Filled 2021-08-16 (×3): qty 300
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
## 2021-08-17 ENCOUNTER — Inpatient Hospital Stay: Payer: Medicare Other | Admitting: Radiology

## 2021-08-17 HISTORY — PX: IR REPLC GASTRO/COLONIC TUBE PERCUT W/FLUORO: IMG2333

## 2021-08-17 LAB — COMPREHENSIVE METABOLIC PANEL
ALT: 33 U/L (ref 0–44)
AST: 48 U/L — ABNORMAL HIGH (ref 15–41)
Albumin: 2.8 g/dL — ABNORMAL LOW (ref 3.5–5.0)
Alkaline Phosphatase: 57 U/L (ref 38–126)
Anion gap: 5 (ref 5–15)
BUN: 27 mg/dL — ABNORMAL HIGH (ref 8–23)
CO2: 33 mmol/L — ABNORMAL HIGH (ref 22–32)
Calcium: 8.5 mg/dL — ABNORMAL LOW (ref 8.9–10.3)
Chloride: 107 mmol/L (ref 98–111)
Creatinine, Ser: 0.58 mg/dL (ref 0.44–1.00)
GFR, Estimated: >60 mL/min (ref >60)
Glucose, Bld: 122 mg/dL — ABNORMAL HIGH (ref 70–99)
Potassium: 3.4 mmol/L — ABNORMAL LOW (ref 3.5–5.1)
Sodium: 145 mmol/L (ref 135–145)
Total Bilirubin: 0.5 mg/dL (ref 0.3–1.2)
Total Protein: 6.3 g/dL — ABNORMAL LOW (ref 6.5–8.1)

## 2021-08-17 LAB — PHOSPHORUS: Phosphorus: 2.5 mg/dL (ref 2.5–4.6)

## 2021-08-17 LAB — MAGNESIUM: Magnesium: 2 mg/dL (ref 1.7–2.4)

## 2021-08-17 LAB — CBC WITH DIFFERENTIAL/PLATELET
Abs Immature Granulocytes: 0.07 10*3/uL (ref 0.00–0.07)
Basophils Absolute: 0 10*3/uL (ref 0.0–0.1)
Basophils Relative: 0 %
Eosinophils Absolute: 0 10*3/uL (ref 0.0–0.5)
Eosinophils Relative: 0 %
HCT: 32.2 % — ABNORMAL LOW (ref 36.0–46.0)
Hemoglobin: 10.2 g/dL — ABNORMAL LOW (ref 12.0–15.0)
Immature Granulocytes: 0 %
Lymphocytes Relative: 16 %
Lymphs Abs: 2.8 10*3/uL (ref 0.7–4.0)
MCH: 31.2 pg (ref 26.0–34.0)
MCHC: 31.7 g/dL (ref 30.0–36.0)
MCV: 98.5 fL (ref 80.0–100.0)
Monocytes Absolute: 0.6 10*3/uL (ref 0.1–1.0)
Monocytes Relative: 4 %
Neutro Abs: 13.7 10*3/uL — ABNORMAL HIGH (ref 1.7–7.7)
Neutrophils Relative %: 80 %
Platelets: 167 10*3/uL (ref 150–400)
RBC: 3.27 MIL/uL — ABNORMAL LOW (ref 3.87–5.11)
RDW: 13.3 % (ref 11.5–15.5)
WBC: 17.2 10*3/uL — ABNORMAL HIGH (ref 4.0–10.5)
nRBC: 0 % (ref 0.0–0.2)

## 2021-08-17 LAB — GLUCOSE, CAPILLARY
Glucose-Capillary: 115 mg/dL — ABNORMAL HIGH (ref 70–99)
Glucose-Capillary: 123 mg/dL — ABNORMAL HIGH (ref 70–99)
Glucose-Capillary: 128 mg/dL — ABNORMAL HIGH (ref 70–99)
Glucose-Capillary: 131 mg/dL — ABNORMAL HIGH (ref 70–99)
Glucose-Capillary: 132 mg/dL — ABNORMAL HIGH (ref 70–99)
Glucose-Capillary: 161 mg/dL — ABNORMAL HIGH (ref 70–99)
Glucose-Capillary: 177 mg/dL — ABNORMAL HIGH (ref 70–99)

## 2021-08-17 MED ORDER — FREE WATER
150.0000 mL | Status: DC
Start: 2021-08-17 — End: 2021-08-21
  Administered 2021-08-17 – 2021-08-21 (×22): 150 mL

## 2021-08-17 MED ORDER — LIDOCAINE VISCOUS HCL 2 % MT SOLN
OROMUCOSAL | Status: AC
  Filled 2021-08-17: qty 15

## 2021-08-17 MED ORDER — VALPROIC ACID 250 MG/5ML PO SOLN
125.0000 mg | Freq: Two times a day (BID) | ORAL | Status: DC
  Administered 2021-08-17 – 2021-08-24 (×14): 125 mg
  Filled 2021-08-17 (×16): qty 5

## 2021-08-17 MED ORDER — FUROSEMIDE 10 MG/ML IJ SOLN
20.0000 mg | Freq: Every day | INTRAMUSCULAR | Status: DC
  Administered 2021-08-18 – 2021-08-20 (×3): 20 mg via INTRAVENOUS
  Filled 2021-08-17 (×3): qty 2

## 2021-08-17 MED ORDER — LIDOCAINE HCL 1 % IJ SOLN
INTRAMUSCULAR | Status: AC
  Filled 2021-08-17: qty 20

## 2021-08-17 MED ORDER — POTASSIUM CHLORIDE 10 MEQ/50ML IV SOLN
10.0000 meq | INTRAVENOUS | Status: AC
  Administered 2021-08-17 (×6): 10 meq via INTRAVENOUS
  Filled 2021-08-17 (×6): qty 50

## 2021-08-17 NOTE — Procedures (Signed)
Interventional Radiology Procedure Note ? ?Procedure: Gastrostomy tube replacement under fluoro ? ?Complications: None ? ?Estimated Blood Loss: None ? ?Findings: ?New 18 Fr balloon retention gastrostomy tube placed via tract into body of stomach. OK to use immediately. ? ?Kaitlyn Ruiz, M.D ?Pager:  [phone] ? ?  ?
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
## 2021-08-18 LAB — CBC WITH DIFFERENTIAL/PLATELET
Abs Immature Granulocytes: 0.13 10*3/uL — ABNORMAL HIGH (ref 0.00–0.07)
Basophils Absolute: 0 10*3/uL (ref 0.0–0.1)
Basophils Relative: 0 %
Eosinophils Absolute: 0 10*3/uL (ref 0.0–0.5)
Eosinophils Relative: 0 %
HCT: 36.8 % (ref 36.0–46.0)
Hemoglobin: 11.9 g/dL — ABNORMAL LOW (ref 12.0–15.0)
Immature Granulocytes: 1 %
Lymphocytes Relative: 14 %
Lymphs Abs: 2.9 10*3/uL (ref 0.7–4.0)
MCH: 31.1 pg (ref 26.0–34.0)
MCHC: 32.3 g/dL (ref 30.0–36.0)
MCV: 96.1 fL (ref 80.0–100.0)
Monocytes Absolute: 1 10*3/uL (ref 0.1–1.0)
Monocytes Relative: 5 %
Neutro Abs: 16.9 10*3/uL — ABNORMAL HIGH (ref 1.7–7.7)
Neutrophils Relative %: 80 %
Platelets: 266 10*3/uL (ref 150–400)
RBC: 3.83 MIL/uL — ABNORMAL LOW (ref 3.87–5.11)
RDW: 13.4 % (ref 11.5–15.5)
WBC: 20.8 10*3/uL — ABNORMAL HIGH (ref 4.0–10.5)
nRBC: 0.2 % (ref 0.0–0.2)

## 2021-08-18 LAB — MAGNESIUM: Magnesium: 2 mg/dL (ref 1.7–2.4)

## 2021-08-18 LAB — COMPREHENSIVE METABOLIC PANEL
ALT: 59 U/L — ABNORMAL HIGH (ref 0–44)
AST: 83 U/L — ABNORMAL HIGH (ref 15–41)
Albumin: 2.9 g/dL — ABNORMAL LOW (ref 3.5–5.0)
Alkaline Phosphatase: 72 U/L (ref 38–126)
Anion gap: 10 (ref 5–15)
BUN: 30 mg/dL — ABNORMAL HIGH (ref 8–23)
CO2: 31 mmol/L (ref 22–32)
Calcium: 8.6 mg/dL — ABNORMAL LOW (ref 8.9–10.3)
Chloride: 99 mmol/L (ref 98–111)
Creatinine, Ser: 0.63 mg/dL (ref 0.44–1.00)
GFR, Estimated: >60 mL/min (ref >60)
Glucose, Bld: 139 mg/dL — ABNORMAL HIGH (ref 70–99)
Potassium: 3.6 mmol/L (ref 3.5–5.1)
Sodium: 140 mmol/L (ref 135–145)
Total Bilirubin: 0.4 mg/dL (ref 0.3–1.2)
Total Protein: 6.6 g/dL (ref 6.5–8.1)

## 2021-08-18 LAB — GLUCOSE, CAPILLARY
Glucose-Capillary: 125 mg/dL — ABNORMAL HIGH (ref 70–99)
Glucose-Capillary: 144 mg/dL — ABNORMAL HIGH (ref 70–99)
Glucose-Capillary: 132 mg/dL — ABNORMAL HIGH (ref 70–99)
Glucose-Capillary: 143 mg/dL — ABNORMAL HIGH (ref 70–99)
Glucose-Capillary: 130 mg/dL — ABNORMAL HIGH (ref 70–99)

## 2021-08-18 LAB — PHOSPHORUS: Phosphorus: 2.6 mg/dL (ref 2.5–4.6)

## 2021-08-18 MED ORDER — ONDANSETRON HCL 4 MG/2ML IJ SOLN: 4.0000 mg | Freq: Four times a day (QID) | INTRAMUSCULAR | Status: DC | PRN

## 2021-08-18 MED ORDER — POTASSIUM CHLORIDE 20 MEQ PO PACK
40.0000 meq | PACK | Freq: Once | ORAL | Status: AC
  Administered 2021-08-18: 40 meq
  Filled 2021-08-18: qty 2

## 2021-08-18 NOTE — Plan of Care (Signed)
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
## 2021-08-18 NOTE — TOC Initial Note (Signed)
Transition of Care (TOC) - Initial/Assessment Note  ? ? ?Patient Details  ?Name: Kaitlyn Ruiz ?MRN: [id number] ?Date of Birth: [birth year] ? ?Transition of Care (TOC) CM/SW Contact:    ?Starbuck Cellar, RN ?Phone Number: ?08/18/2021, 11:49 AM ? ?Clinical Narrative:                 ?Spoke to [employer] who was requesting update on patient. Plan remains patient will return to [facility] once medically cleared.  ? ?Expected Discharge Plan: Skilled Nursing Facility ?Barriers to Discharge: Continued Medical Work up ? ? ?Patient Goals and CMS Choice ?Patient states their goals for this hospitalization and ongoing recovery are:: plan to return to [facility] ?  ?  ? ?Expected Discharge Plan and Services ?Expected Discharge Plan: Skilled Nursing Facility ?  ?Discharge Planning Services: CM Consult ?Post Acute Care Choice: Resumption of Svcs/PTA Provider, Skilled Nursing Facility ?Living arrangements for the past 2 months: Single Family Home ?                ?DME Arranged: N/A ?DME Agency: NA ?  ?  ?  ?HH Arranged: NA ?  ?  ?  ?  ? ?Prior Living Arrangements/Services ?Living arrangements for the past 2 months: Single Family Home ?Lives with:: Facility Resident ?Patient language and need for interpreter reviewed:: Yes ?Do you feel safe going back to the place where you live?: Yes      ?Need for Family Participation in Patient Care: Yes (Comment) ?Care giver support system in place?: Yes (comment) (SNF) ?  ?Criminal Activity/Legal Involvement Pertinent to Current Situation/Hospitalization: No - Comment as needed ? ?Activities of Daily Living ?Home Assistive Devices/Equipment: Other (Comment) (from SNF) ?ADL Screening (condition at time of admission) ?Patient's cognitive ability adequate to safely complete daily activities?: No ?Is the patient deaf or have difficulty hearing?: No ?Does the patient have difficulty seeing, even when wearing glasses/contacts?: No ?Does the patient have difficulty  concentrating, remembering, or making decisions?: Yes ?Patient able to express need for assistance with ADLs?: No ?Does the patient have difficulty dressing or bathing?: Yes ?Independently performs ADLs?: No ?Communication: Dependent ?Is this a change from baseline?: Pre-admission baseline ?Dressing (OT): Dependent ?Is this a change from baseline?: Pre-admission baseline ?Grooming: Dependent ?Is this a change from baseline?: Pre-admission baseline ?Feeding: Dependent ?Is this a change from baseline?: Pre-admission baseline ?Bathing: Dependent ?Is this a change from baseline?: Pre-admission baseline ?Toileting: Dependent ?Is this a change from baseline?: Pre-admission baseline ?In/Out Bed: Dependent ?Is this a change from baseline?: Pre-admission baseline ?Walks in Home: Dependent ?Is this a change from baseline?: Pre-admission baseline ?Does the patient have difficulty walking or climbing stairs?: Yes ?Weakness of Legs: Both ?Weakness of Arms/Hands: Both ? ?Permission Sought/Granted ?Permission sought to share information with : Case Manager, Family Supports, Magazine features editor ?Permission granted to share information with : Yes, Verbal Permission Granted ? Share Information with NAME: [name] Benbow ? Permission granted to share info w AGENCY: [facility] ? Permission granted to share info w Relationship: sister ? Permission granted to share info w Contact Information: [phone] ? ?Emotional Assessment ?Appearance:: Appears stated age ?Attitude/Demeanor/Rapport: Unable to Assess ?Affect (typically observed): Unable to Assess ?  ?Alcohol / Substance Use: Not Applicable ?Psych Involvement: No (comment) ? ?Admission diagnosis:  Acute respiratory failure (HCC) [J96.00] ?Difficult intubation [T88.4XXA] ?Patient Active Problem List  ? Diagnosis Date Noted  ? Acute respiratory distress   ? Acute respiratory failure (HCC) 08/11/2021  ? Encounter for  central line placement   ? Cerebral artery  occlusion with cerebral infarction (HCC) 08/04/2021  ? Malfunction of percutaneous endoscopic gastrostomy (PEG) tube (HCC) 08/03/2021  ? Chronic hepatitis C (HCC) 08/03/2021  ? Depression 08/03/2021  ? Tobacco use disorder 08/03/2021  ? 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 Acute on chronic respiratory failure with hypoxia (HCC) 05/26/2019  ? 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/26/2019  ? Non-insulin dependent type 2 diabetes mellitus (HCC) 05/26/2019  ? Hyponatremia 05/26/2019  ? UTI (urinary tract infection) 05/26/2019  ? 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 Dysphagia as late effect of cerebrovascular accident (CVA) 05/26/2019  ? Appetite impaired 08/15/2018  ? Anorexia 06/05/2018  ? Weakness generalized 06/05/2018  ? Esophageal dysphagia   ? Oropharyngeal dysphagia   ? Neurological dysfunction   ? Endotracheal tube present   ? Palliative care encounter   ? Acute respiratory failure with hypoxemia (HCC) 05/14/2018  ? Acute metabolic encephalopathy   ? Septic shock (HCC) 04/10/2017  ? HTN (hypertension) 12/28/2013  ? ?PCP:  Abner Greenspan, MD ?Pharmacy:   ?[facility] [place], [place] - [address] ?[address] ?[place] [address] ?Phone: [phone] Fax: [phone] ? ? ? ? ?Social Determinants of Health (SDOH) Interventions ?  ? ?Readmission Risk Interventions ?Readmission Risk Prevention Plan 08/14/2021  ?Transportation Screening Complete  ?PCP or Specialist Appt within 3-5 Days Complete  ?HRI or Home Care Consult Complete  ?Social Work Consult for Recovery Care Planning/Counseling Complete  ?Medication Review Oceanographer) Complete  ?Some recent data might be hidden  ? ? ? ?
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
## 2021-08-19 ENCOUNTER — Inpatient Hospital Stay: Payer: Self-pay

## 2021-08-19 LAB — CBC WITH DIFFERENTIAL/PLATELET
Abs Immature Granulocytes: 0.18 10*3/uL — ABNORMAL HIGH (ref 0.00–0.07)
Basophils Absolute: 0 10*3/uL (ref 0.0–0.1)
Basophils Relative: 0 %
Eosinophils Absolute: 0 10*3/uL (ref 0.0–0.5)
Eosinophils Relative: 0 %
HCT: 38 % (ref 36.0–46.0)
Hemoglobin: 12.6 g/dL (ref 12.0–15.0)
Immature Granulocytes: 1 %
Lymphocytes Relative: 31 %
Lymphs Abs: 7.9 10*3/uL — ABNORMAL HIGH (ref 0.7–4.0)
MCH: 31.4 pg (ref 26.0–34.0)
MCHC: 33.2 g/dL (ref 30.0–36.0)
MCV: 94.8 fL (ref 80.0–100.0)
Monocytes Absolute: 2.3 10*3/uL — ABNORMAL HIGH (ref 0.1–1.0)
Monocytes Relative: 9 %
Neutro Abs: 15.2 10*3/uL — ABNORMAL HIGH (ref 1.7–7.7)
Neutrophils Relative %: 59 %
Platelets: 298 10*3/uL (ref 150–400)
RBC: 4.01 MIL/uL (ref 3.87–5.11)
RDW: 13.7 % (ref 11.5–15.5)
Smear Review: NORMAL
WBC: 25.6 10*3/uL — ABNORMAL HIGH (ref 4.0–10.5)
nRBC: 0.3 % — ABNORMAL HIGH (ref 0.0–0.2)

## 2021-08-19 LAB — COMPREHENSIVE METABOLIC PANEL
ALT: 64 U/L — ABNORMAL HIGH (ref 0–44)
AST: 85 U/L — ABNORMAL HIGH (ref 15–41)
Albumin: 2.6 g/dL — ABNORMAL LOW (ref 3.5–5.0)
Alkaline Phosphatase: 96 U/L (ref 38–126)
Anion gap: 4 — ABNORMAL LOW (ref 5–15)
BUN: 37 mg/dL — ABNORMAL HIGH (ref 8–23)
CO2: 32 mmol/L (ref 22–32)
Calcium: 8.5 mg/dL — ABNORMAL LOW (ref 8.9–10.3)
Chloride: 107 mmol/L (ref 98–111)
Creatinine, Ser: 0.5 mg/dL (ref 0.44–1.00)
GFR, Estimated: >60 mL/min (ref >60)
Glucose, Bld: 159 mg/dL — ABNORMAL HIGH (ref 70–99)
Potassium: 3.7 mmol/L (ref 3.5–5.1)
Sodium: 143 mmol/L (ref 135–145)
Total Bilirubin: 0.6 mg/dL (ref 0.3–1.2)
Total Protein: 6.1 g/dL — ABNORMAL LOW (ref 6.5–8.1)

## 2021-08-19 LAB — LACTIC ACID, PLASMA
Lactic Acid, Venous: 2 mmol/L (ref 0.5–1.9)
Lactic Acid, Venous: 2.3 mmol/L (ref 0.5–1.9)

## 2021-08-19 LAB — PHOSPHORUS: Phosphorus: 1.9 mg/dL — ABNORMAL LOW (ref 2.5–4.6)

## 2021-08-19 LAB — GLUCOSE, CAPILLARY
Glucose-Capillary: 114 mg/dL — ABNORMAL HIGH (ref 70–99)
Glucose-Capillary: 114 mg/dL — ABNORMAL HIGH (ref 70–99)
Glucose-Capillary: 123 mg/dL — ABNORMAL HIGH (ref 70–99)
Glucose-Capillary: 128 mg/dL — ABNORMAL HIGH (ref 70–99)
Glucose-Capillary: 131 mg/dL — ABNORMAL HIGH (ref 70–99)
Glucose-Capillary: 143 mg/dL — ABNORMAL HIGH (ref 70–99)
Glucose-Capillary: 145 mg/dL — ABNORMAL HIGH (ref 70–99)

## 2021-08-19 LAB — MAGNESIUM: Magnesium: 2.1 mg/dL (ref 1.7–2.4)

## 2021-08-19 MED ORDER — ALBUMIN HUMAN 25 % IV SOLN
12.5000 g | Freq: Once | INTRAVENOUS | Status: AC
  Administered 2021-08-19: 12.5 g via INTRAVENOUS
  Filled 2021-08-19: qty 50

## 2021-08-19 MED ORDER — FLUDROCORTISONE ACETATE 0.1 MG PO TABS
0.2000 mg | ORAL_TABLET | Freq: Every day | ORAL | Status: DC
  Administered 2021-08-19 – 2021-08-24 (×6): 0.2 mg
  Filled 2021-08-19 (×6): qty 2

## 2021-08-19 MED ORDER — SODIUM CHLORIDE 0.9% FLUSH: 10.0000 mL | INTRAVENOUS | Status: DC | PRN

## 2021-08-19 MED ORDER — LACTATED RINGERS IV BOLUS
1000.0000 mL | Freq: Once | INTRAVENOUS | Status: AC
  Administered 2021-08-19: 1000 mL via INTRAVENOUS

## 2021-08-19 MED ORDER — POTASSIUM & SODIUM PHOSPHATES 280-160-250 MG PO PACK
1.0000 | PACK | Freq: Three times a day (TID) | ORAL | Status: AC
  Administered 2021-08-19 (×4): 1
  Filled 2021-08-19 (×4): qty 1

## 2021-08-19 NOTE — Progress Notes (Signed)
Peripherally Inserted Central Catheter Placement ? ?The IV Nurse has discussed with the patient and/or persons authorized to consent for the patient, the purpose of this procedure and the potential benefits and risks involved with this procedure.  The benefits include less needle sticks, lab draws from the catheter, and the patient may be discharged home with the catheter. Risks include, but not limited to, infection, bleeding, blood clot (thrombus formation), and puncture of an artery; nerve damage and irregular heartbeat and possibility to perform a PICC exchange if needed/ordered by physician.  Alternatives to this procedure were also discussed.  Bard Power PICC patient education guide, fact sheet on infection prevention and patient information card has been provided to patient /or left at bedside.   ? ?PICC Placement Documentation  ?PICC Triple Lumen 08/19/21 Right Brachial 36 cm 0 cm (Active)  ?Indication for Insertion or Continuance of Line Prolonged intravenous therapies 08/19/21 1538  ?Exposed Catheter (cm) 0 cm 08/19/21 1538  ?Site Assessment Clean, Dry, Intact 08/19/21 1538  ?Lumen #1 Status Flushed;Blood return noted;Saline locked 08/19/21 1538  ?Lumen #2 Status Flushed;Blood return noted;Saline locked 08/19/21 1538  ?Lumen #3 Status Flushed;Blood return noted;Saline locked 08/19/21 1538  ?Dressing Type Securing device 08/19/21 1538  ?Dressing Change Due 08/26/21 08/19/21 1538  ? ? ? ? ? ?Kaitlyn Ruiz ?08/19/2021, 3:42 PM ? ?
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
## 2021-08-20 LAB — CBC WITH DIFFERENTIAL/PLATELET
Abs Immature Granulocytes: 0.15 10*3/uL — ABNORMAL HIGH (ref 0.00–0.07)
Basophils Absolute: 0 10*3/uL (ref 0.0–0.1)
Basophils Relative: 0 %
Eosinophils Absolute: 0.4 10*3/uL (ref 0.0–0.5)
Eosinophils Relative: 3 %
HCT: 36 % (ref 36.0–46.0)
Hemoglobin: 11.6 g/dL — ABNORMAL LOW (ref 12.0–15.0)
Immature Granulocytes: 1 %
Lymphocytes Relative: 47 %
Lymphs Abs: 8 10*3/uL — ABNORMAL HIGH (ref 0.7–4.0)
MCH: 31 pg (ref 26.0–34.0)
MCHC: 32.2 g/dL (ref 30.0–36.0)
MCV: 96.3 fL (ref 80.0–100.0)
Monocytes Absolute: 1.5 10*3/uL — ABNORMAL HIGH (ref 0.1–1.0)
Monocytes Relative: 9 %
Neutro Abs: 6.9 10*3/uL (ref 1.7–7.7)
Neutrophils Relative %: 40 %
Platelets: 187 10*3/uL (ref 150–400)
RBC: 3.74 MIL/uL — ABNORMAL LOW (ref 3.87–5.11)
RDW: 14.2 % (ref 11.5–15.5)
Smear Review: NORMAL
WBC: 17.1 10*3/uL — ABNORMAL HIGH (ref 4.0–10.5)
nRBC: 0.4 % — ABNORMAL HIGH (ref 0.0–0.2)

## 2021-08-20 LAB — GLUCOSE, CAPILLARY
Glucose-Capillary: 129 mg/dL — ABNORMAL HIGH (ref 70–99)
Glucose-Capillary: 121 mg/dL — ABNORMAL HIGH (ref 70–99)
Glucose-Capillary: 143 mg/dL — ABNORMAL HIGH (ref 70–99)
Glucose-Capillary: 208 mg/dL — ABNORMAL HIGH (ref 70–99)
Glucose-Capillary: 122 mg/dL — ABNORMAL HIGH (ref 70–99)
Glucose-Capillary: 162 mg/dL — ABNORMAL HIGH (ref 70–99)

## 2021-08-20 LAB — BASIC METABOLIC PANEL
Anion gap: 5 (ref 5–15)
BUN: 30 mg/dL — ABNORMAL HIGH (ref 8–23)
CO2: 33 mmol/L — ABNORMAL HIGH (ref 22–32)
Calcium: 8.4 mg/dL — ABNORMAL LOW (ref 8.9–10.3)
Chloride: 108 mmol/L (ref 98–111)
Creatinine, Ser: 0.53 mg/dL (ref 0.44–1.00)
GFR, Estimated: >60 mL/min (ref >60)
Glucose, Bld: 145 mg/dL — ABNORMAL HIGH (ref 70–99)
Potassium: 3.2 mmol/L — ABNORMAL LOW (ref 3.5–5.1)
Sodium: 146 mmol/L — ABNORMAL HIGH (ref 135–145)

## 2021-08-20 LAB — MAGNESIUM: Magnesium: 1.9 mg/dL (ref 1.7–2.4)

## 2021-08-20 LAB — PHOSPHORUS: Phosphorus: 2.2 mg/dL — ABNORMAL LOW (ref 2.5–4.6)

## 2021-08-20 MED ORDER — POTASSIUM CHLORIDE 20 MEQ PO PACK
40.0000 meq | PACK | Freq: Two times a day (BID) | ORAL | Status: AC
  Administered 2021-08-20 (×2): 40 meq
  Filled 2021-08-20 (×2): qty 2

## 2021-08-20 MED ORDER — HYDROCORTISONE SOD SUC (PF) 100 MG IJ SOLR
100.0000 mg | Freq: Three times a day (TID) | INTRAMUSCULAR | Status: DC
  Administered 2021-08-20 (×3): 100 mg via INTRAVENOUS
  Filled 2021-08-20 (×3): qty 2

## 2021-08-20 MED ORDER — POTASSIUM & SODIUM PHOSPHATES 280-160-250 MG PO PACK
1.0000 | PACK | Freq: Three times a day (TID) | ORAL | Status: AC
  Administered 2021-08-20 (×4): 1
  Filled 2021-08-20 (×4): qty 1

## 2021-08-20 MED ORDER — MAGNESIUM SULFATE 2 GM/50ML IV SOLN
2.0000 g | Freq: Once | INTRAVENOUS | Status: AC
  Administered 2021-08-20: 2 g via INTRAVENOUS
  Filled 2021-08-20: qty 50

## 2021-08-20 MED ORDER — INSULIN ASPART 100 UNIT/ML IJ SOLN
3.0000 [IU] | INTRAMUSCULAR | Status: DC
  Administered 2021-08-20: 3 [IU] via SUBCUTANEOUS
  Administered 2021-08-20 – 2021-08-21 (×2): 6 [IU] via SUBCUTANEOUS
  Administered 2021-08-23 – 2021-08-24 (×3): 3 [IU] via SUBCUTANEOUS
  Filled 2021-08-20 (×6): qty 1
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
## 2021-08-21 DIAGNOSIS — J15212 Pneumonia due to Methicillin resistant Staphylococcus aureus: Secondary | ICD-10-CM

## 2021-08-21 DIAGNOSIS — I5032 Chronic diastolic (congestive) heart failure: Secondary | ICD-10-CM

## 2021-08-21 DIAGNOSIS — A419 Sepsis, unspecified organism: Secondary | ICD-10-CM

## 2021-08-21 DIAGNOSIS — J9601 Acute respiratory failure with hypoxia: Secondary | ICD-10-CM | POA: Diagnosis not present

## 2021-08-21 DIAGNOSIS — E870 Hyperosmolality and hypernatremia: Secondary | ICD-10-CM

## 2021-08-21 DIAGNOSIS — R6521 Severe sepsis with septic shock: Secondary | ICD-10-CM

## 2021-08-21 DIAGNOSIS — J9602 Acute respiratory failure with hypercapnia: Secondary | ICD-10-CM

## 2021-08-21 LAB — BASIC METABOLIC PANEL
Anion gap: 6 (ref 5–15)
BUN: 34 mg/dL — ABNORMAL HIGH (ref 8–23)
CO2: 32 mmol/L (ref 22–32)
Calcium: 8.5 mg/dL — ABNORMAL LOW (ref 8.9–10.3)
Chloride: 107 mmol/L (ref 98–111)
Creatinine, Ser: 0.68 mg/dL (ref 0.44–1.00)
GFR, Estimated: >60 mL/min (ref >60)
Glucose, Bld: 153 mg/dL — ABNORMAL HIGH (ref 70–99)
Potassium: 3.8 mmol/L (ref 3.5–5.1)
Sodium: 145 mmol/L (ref 135–145)

## 2021-08-21 LAB — CBC WITH DIFFERENTIAL/PLATELET
Abs Immature Granulocytes: 0.16 10*3/uL — ABNORMAL HIGH (ref 0.00–0.07)
Basophils Absolute: 0 10*3/uL (ref 0.0–0.1)
Basophils Relative: 0 %
Eosinophils Absolute: 0 10*3/uL (ref 0.0–0.5)
Eosinophils Relative: 0 %
HCT: 37.5 % (ref 36.0–46.0)
Hemoglobin: 12.1 g/dL (ref 12.0–15.0)
Immature Granulocytes: 1 %
Lymphocytes Relative: 17 %
Lymphs Abs: 3.2 10*3/uL (ref 0.7–4.0)
MCH: 31.6 pg (ref 26.0–34.0)
MCHC: 32.3 g/dL (ref 30.0–36.0)
MCV: 97.9 fL (ref 80.0–100.0)
Monocytes Absolute: 0.7 10*3/uL (ref 0.1–1.0)
Monocytes Relative: 4 %
Neutro Abs: 15 10*3/uL — ABNORMAL HIGH (ref 1.7–7.7)
Neutrophils Relative %: 78 %
Platelets: 188 10*3/uL (ref 150–400)
RBC: 3.83 MIL/uL — ABNORMAL LOW (ref 3.87–5.11)
RDW: 14.6 % (ref 11.5–15.5)
WBC: 19.1 10*3/uL — ABNORMAL HIGH (ref 4.0–10.5)
nRBC: 0.3 % — ABNORMAL HIGH (ref 0.0–0.2)

## 2021-08-21 LAB — HEPATIC FUNCTION PANEL
ALT: 57 U/L — ABNORMAL HIGH (ref 0–44)
AST: 71 U/L — ABNORMAL HIGH (ref 15–41)
Albumin: 2.7 g/dL — ABNORMAL LOW (ref 3.5–5.0)
Alkaline Phosphatase: 93 U/L (ref 38–126)
Bilirubin, Direct: 0.1 mg/dL (ref 0.0–0.2)
Indirect Bilirubin: 0.6 mg/dL (ref 0.3–0.9)
Total Bilirubin: 0.7 mg/dL (ref 0.3–1.2)
Total Protein: 6.1 g/dL — ABNORMAL LOW (ref 6.5–8.1)

## 2021-08-21 LAB — PHOSPHORUS: Phosphorus: 2.8 mg/dL (ref 2.5–4.6)

## 2021-08-21 LAB — GLUCOSE, CAPILLARY
Glucose-Capillary: 101 mg/dL — ABNORMAL HIGH (ref 70–99)
Glucose-Capillary: 104 mg/dL — ABNORMAL HIGH (ref 70–99)
Glucose-Capillary: 112 mg/dL — ABNORMAL HIGH (ref 70–99)
Glucose-Capillary: 112 mg/dL — ABNORMAL HIGH (ref 70–99)
Glucose-Capillary: 113 mg/dL — ABNORMAL HIGH (ref 70–99)
Glucose-Capillary: 156 mg/dL — ABNORMAL HIGH (ref 70–99)

## 2021-08-21 LAB — LACTIC ACID, PLASMA: Lactic Acid, Venous: 2.3 mmol/L (ref 0.5–1.9)

## 2021-08-21 MED ORDER — FREE WATER
200.0000 mL | Status: DC
Start: 2021-08-21 — End: 2021-08-22
  Administered 2021-08-21 – 2021-08-22 (×7): 200 mL

## 2021-08-21 MED ORDER — ASPIRIN 81 MG PO CHEW
81.0000 mg | CHEWABLE_TABLET | Freq: Every day | ORAL | Status: DC
  Administered 2021-08-21 – 2021-08-24 (×4): 81 mg
  Filled 2021-08-21 (×4): qty 1

## 2021-08-21 MED ORDER — ZINC OXIDE 40 % EX OINT
TOPICAL_OINTMENT | CUTANEOUS | Status: DC | PRN
  Filled 2021-08-21: qty 113

## 2021-08-21 MED ORDER — LACTATED RINGERS IV BOLUS
500.0000 mL | Freq: Once | INTRAVENOUS | Status: AC
  Administered 2021-08-21: 500 mL via INTRAVENOUS

## 2021-08-21 MED ORDER — MIDODRINE HCL 5 MG PO TABS
15.0000 mg | ORAL_TABLET | Freq: Three times a day (TID) | ORAL | Status: DC
  Administered 2021-08-21 – 2021-08-24 (×12): 15 mg
  Filled 2021-08-21 (×11): qty 3
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
## 2021-08-22 ENCOUNTER — Encounter: Payer: Self-pay | Admitting: Internal Medicine

## 2021-08-22 DIAGNOSIS — J15211 Pneumonia due to Methicillin susceptible Staphylococcus aureus: Secondary | ICD-10-CM

## 2021-08-22 DIAGNOSIS — J9601 Acute respiratory failure with hypoxia: Secondary | ICD-10-CM | POA: Diagnosis not present

## 2021-08-22 DIAGNOSIS — R197 Diarrhea, unspecified: Secondary | ICD-10-CM | POA: Diagnosis not present

## 2021-08-22 DIAGNOSIS — Z8673 Personal history of transient ischemic attack (TIA), and cerebral infarction without residual deficits: Secondary | ICD-10-CM | POA: Diagnosis not present

## 2021-08-22 DIAGNOSIS — I5032 Chronic diastolic (congestive) heart failure: Secondary | ICD-10-CM | POA: Diagnosis not present

## 2021-08-22 DIAGNOSIS — E119 Type 2 diabetes mellitus without complications: Secondary | ICD-10-CM

## 2021-08-22 DIAGNOSIS — E870 Hyperosmolality and hypernatremia: Secondary | ICD-10-CM

## 2021-08-22 DIAGNOSIS — E876 Hypokalemia: Secondary | ICD-10-CM

## 2021-08-22 DIAGNOSIS — I5030 Unspecified diastolic (congestive) heart failure: Secondary | ICD-10-CM

## 2021-08-22 LAB — CBC WITH DIFFERENTIAL/PLATELET
Abs Immature Granulocytes: 0.17 10*3/uL — ABNORMAL HIGH (ref 0.00–0.07)
Basophils Absolute: 0 10*3/uL (ref 0.0–0.1)
Basophils Relative: 0 %
Eosinophils Absolute: 0.1 10*3/uL (ref 0.0–0.5)
Eosinophils Relative: 0 %
HCT: 33.4 % — ABNORMAL LOW (ref 36.0–46.0)
Hemoglobin: 10.5 g/dL — ABNORMAL LOW (ref 12.0–15.0)
Immature Granulocytes: 1 %
Lymphocytes Relative: 34 %
Lymphs Abs: 7.3 10*3/uL — ABNORMAL HIGH (ref 0.7–4.0)
MCH: 31.3 pg (ref 26.0–34.0)
MCHC: 31.4 g/dL (ref 30.0–36.0)
MCV: 99.7 fL (ref 80.0–100.0)
Monocytes Absolute: 1.1 10*3/uL — ABNORMAL HIGH (ref 0.1–1.0)
Monocytes Relative: 5 %
Neutro Abs: 12.9 10*3/uL — ABNORMAL HIGH (ref 1.7–7.7)
Neutrophils Relative %: 60 %
Platelets: 156 10*3/uL (ref 150–400)
RBC: 3.35 MIL/uL — ABNORMAL LOW (ref 3.87–5.11)
RDW: 15.8 % — ABNORMAL HIGH (ref 11.5–15.5)
WBC: 21.5 10*3/uL — ABNORMAL HIGH (ref 4.0–10.5)
nRBC: 0.4 % — ABNORMAL HIGH (ref 0.0–0.2)

## 2021-08-22 LAB — GLUCOSE, CAPILLARY
Glucose-Capillary: 118 mg/dL — ABNORMAL HIGH (ref 70–99)
Glucose-Capillary: 99 mg/dL (ref 70–99)
Glucose-Capillary: 98 mg/dL (ref 70–99)
Glucose-Capillary: 120 mg/dL — ABNORMAL HIGH (ref 70–99)
Glucose-Capillary: 115 mg/dL — ABNORMAL HIGH (ref 70–99)

## 2021-08-22 LAB — BASIC METABOLIC PANEL
Anion gap: 6 (ref 5–15)
BUN: 35 mg/dL — ABNORMAL HIGH (ref 8–23)
CO2: 34 mmol/L — ABNORMAL HIGH (ref 22–32)
Calcium: 8.7 mg/dL — ABNORMAL LOW (ref 8.9–10.3)
Chloride: 107 mmol/L (ref 98–111)
Creatinine, Ser: 0.53 mg/dL (ref 0.44–1.00)
GFR, Estimated: >60 mL/min (ref >60)
Glucose, Bld: 123 mg/dL — ABNORMAL HIGH (ref 70–99)
Potassium: 3.3 mmol/L — ABNORMAL LOW (ref 3.5–5.1)
Sodium: 147 mmol/L — ABNORMAL HIGH (ref 135–145)

## 2021-08-22 LAB — PHOSPHORUS: Phosphorus: 2.4 mg/dL — ABNORMAL LOW (ref 2.5–4.6)

## 2021-08-22 LAB — C DIFFICILE (CDIFF) QUICK SCRN (NO PCR REFLEX)
C Diff antigen: NEGATIVE
C Diff interpretation: NOT DETECTED
C Diff toxin: NEGATIVE

## 2021-08-22 LAB — MAGNESIUM: Magnesium: 2.2 mg/dL (ref 1.7–2.4)

## 2021-08-22 MED ORDER — SODIUM PHOSPHATES 45 MMOLE/15ML IV SOLN: 15.0000 mmol | Freq: Once | INTRAVENOUS | Status: DC

## 2021-08-22 MED ORDER — POTASSIUM CHLORIDE 20 MEQ PO PACK
40.0000 meq | PACK | Freq: Once | ORAL | Status: AC
  Administered 2021-08-22: 40 meq
  Filled 2021-08-22: qty 2

## 2021-08-22 MED ORDER — FREE WATER
400.0000 mL | Status: DC
Start: 2021-08-22 — End: 2021-08-23
  Administered 2021-08-22 – 2021-08-23 (×6): 400 mL

## 2021-08-22 MED ORDER — POTASSIUM CHLORIDE 10 MEQ/50ML IV SOLN
10.0000 meq | INTRAVENOUS | Status: DC
  Filled 2021-08-22 (×4): qty 50

## 2021-08-22 MED ORDER — POTASSIUM PHOSPHATES 15 MMOLE/5ML IV SOLN
15.0000 mmol | Freq: Once | INTRAVENOUS | Status: AC
  Administered 2021-08-22: 15 mmol via INTRAVENOUS
  Filled 2021-08-22: qty 5

## 2021-08-22 NOTE — Assessment & Plan Note (Signed)
Patient initially admitted with severe sepsis and septic shock requiring pressors for many days.  Secondary to MSSA pneumonia.  Urine and blood cultures were negative. ?Currently blood pressure within goal with midodrine and Florinef. ?-Continue midodrine and Florinef ?-Continue to monitor ?

## 2021-08-22 NOTE — Assessment & Plan Note (Signed)
Patient admitted with COPD exacerbation secondary to pneumonia. ?Has been resolved.  Now on room air. ?-Continue with as needed bronchodilators ?

## 2021-08-22 NOTE — Assessment & Plan Note (Signed)
Respiratory cultures positive for MSSA.  Patient initially received cefepime and vancomycin, followed by Unasyn and now on Augmentin.  She should be able to complete the course in a day. ?-Continue Augmentin for 1 more day. ?-Continue with supportive care ?

## 2021-08-22 NOTE — Assessment & Plan Note (Signed)
Patient has prior history of CVA with dysarthria, aphasia, and dysphagia along with left-sided residual deficit.  Patient is bedbound at baseline and lives at a skilled nursing facility.  No new deficit. ?-Continue with aspirin and statin ? ?

## 2021-08-22 NOTE — Assessment & Plan Note (Signed)
Mild hypokalemia with potassium of 3.3, magnesium within normal limit. ?-Replete potassium and monitor ?

## 2021-08-22 NOTE — Assessment & Plan Note (Signed)
CBG within goal. -Continue with SSI 

## 2021-08-22 NOTE — Hospital Course (Addendum)
ICU transfer. ? ?Taken from prior notes. ? ?Kaitlyn Ruiz, 65 year old female with past medical history significant for extensive CVA with left-sided deficit, nonverbal and bedbound, PEG tube, COPD, type 2 diabetes and hypertension presented to [facility] ER on 03/14 from [facility] via EMS with acute respiratory distress GCS of 6 and active bleeding in the oropharynx.  ED provider proceeded with mechanical intubation.  She was found to be febrile at 101.1 and tachycardic. ?Labs on admission pertinent for leukocytosis at 22.2, lactic acidosis at 3.8, elevated troponin at 214, mildly elevated liver enzymes.  CT chest with endobronchial material, concerning for aspiration. ?Admitted with severe sepsis secondary to MSSA pneumonia under PCCM service in ICU. ?Patient was initially started on cefepime and vancomycin, later transitioned to Unasyn and now on Augmentin. ? ?Her course of hospitalization was complicated by mucous plugging, persistent hypotension and PEG tube dislodgment. ?She was extubated on 08/12/2021, initially on heated high flow, now transition back to room air. ? ?Her PEG tube was replaced by IR on 08/17/2021. ? ?Patient remained on pressors until 08/21/2021, currently stable on midodrine and Florinef. ? ?Please see PCCM note for complete list of significant events and management. ? ?TRH to resume care on 08/22/2021. ? ?3/25: Patient appears quite confused when seen during morning rounds.  She was just making some eye contact when calling her name, not following any other commands.  Per nursing staff she was little more alert before and able to follow some commands. ?Nursing concern of watery diarrhea and leukocytosis.  No abdominal pain or tenderness, will ask ID regarding testing for C. difficile as she will remain high risk. ? ?3/26: Patient little more alert and following commands today.  She was able to speak couple of words, significant dysarthria.  Having some upper respiratory secretions and few  basal crackles, ordered one-time dose of IV Lasix 20 mg. ?Also decreased free water intake to 250 mL every 4 hourly as hypernatremia has been resolved. ?Family wants a different SNF on discharge. ? ?3/27: Patient currently stable, saturating 100% on 2 L of oxygen which is her baseline.  Transfer to progressive care unit.  TOC was able to find her a new place who can take at today. ?Patient is being discharged on tube feed as she was doing before coming to the hospital. ? ?Palliative care was consulted to discuss goals of care as she is full code.  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  Outpatient palliative care referral was provided. ? ?

## 2021-08-22 NOTE — Progress Notes (Signed)
OT Cancellation Note ? ?Patient Details ?Name: Kaitlyn Ruiz ?MRN: [id number] ?DOB: 1955-08-06 ? ? ?Cancelled Treatment:    Reason Eval/Treat Not Completed: OT screened, no needs identified, will sign off. OT screened, no needs identified, will sign off OT orders received & chart reviewed. Per chart pt is dependent for all aspects of care. PT contacted facility [facility] care) pt is from & staff confirm pt is dependent for mobility, they transfer her with a hoyer lift & pt "doesn't do anything for herself". No acute OT needs at this time. OT to complete current orders. ? ?Arman Filter., MPH, MS, OTR/L ?ascom [phone] ?08/22/21, 8:16 AM ? ?

## 2021-08-22 NOTE — Assessment & Plan Note (Signed)
Patient with mucous plugging in right mainstem with right upper lobe collapse, intubated in ED.  Respiratory cultures positive for MSSA.  Received cefepime, vancomycin, followed by Unasyn and now on Augmentin. ?Extubated on 08/12/2021 to heated high flow-now weaned to room air. ?-Continue with Augmentin to complete a 2-week course. ?-Continue with bronchodilators ?-Continue with chest PT ?

## 2021-08-22 NOTE — Assessment & Plan Note (Signed)
Blood pressure currently on softer side and patient is on midodrine and Florinef. ?No antihypertensives needed. ?-Continue to monitor ?

## 2021-08-22 NOTE — Assessment & Plan Note (Signed)
S/p replacement by IR on 08/17/2021.  Currently functioning well. ?-Continue with current tube feed ?

## 2021-08-22 NOTE — Assessment & Plan Note (Signed)
Patient has an history of chronic HFpEF.  Appears euvolemic. ?Mildly elevated troponin on admission, most likely secondary to demand ischemia with acute respiratory failure and sepsis. ?Echocardiogram done on 08/12/2021 with normal EF and grade 1 diastolic dysfunction.  Right ventricular systolic function low normal.. ?-Continue to monitor ?

## 2021-08-22 NOTE — Assessment & Plan Note (Signed)
Patient with watery diarrhea and worsening leukocytosis.  She is unable to explain whether there is any abdominal pain but no tenderness noted. ?Patient was also started on Florinef on 08/19/2021 which can be contributory to leukocytosis. ?She is high risk for C. difficile colitis. ?-Talked with ID and it is okay to proceed with testing, unable to place the orders as system warrant ID to place these orders, patient is here for more than 4 days. ?-Communicated with ID ?-Continue to monitor ?

## 2021-08-22 NOTE — Assessment & Plan Note (Signed)
Sodium at 147 today with water deficit of 1.7. ?-Increase free water through tube from 200 mL every 4 hourly to 400 mL every 4 hourly. ?-Monitor sodium ?

## 2021-08-22 NOTE — Progress Notes (Signed)
PT Cancellation Note ? ?Patient Details ?Name: Kaitlyn Ruiz ?MRN: [id number] ?DOB: May 02, 1956 ? ? ?Cancelled Treatment:    Reason Eval/Treat Not Completed: PT screened, no needs identified, will sign off PT orders received & chart reviewed. Per chart pt is dependent for all aspects of care. Contacted facility [facility] care) pt is from & staff confirm pt is dependent for mobility, they transfer her with a hoyer lift & pt "doesn't do anything for herself". No acute PT needs at this time. PT to complete current orders. ? ?Aleda Grana, PT, DPT ?08/22/21, 8:09 AM ? ? ?Sandi Mariscal ?08/22/2021, 8:08 AM ?

## 2021-08-22 NOTE — Progress Notes (Signed)
?Progress Note ? ? ?Patient: Kaitlyn Ruiz [id number] DOB: 1956-03-06 DOA: 08/11/2021     11 ?DOS: the patient was seen and examined on 08/22/2021 ?  ?Brief hospital course: ?ICU transfer. ? ?Taken from prior notes. ? ?[name], 66 year old female with past medical history significant for extensive CVA with left-sided deficit, nonverbal and bedbound, PEG tube, COPD, type 2 diabetes and hypertension presented to [facility] ER on 03/14 from [facility] via EMS with acute respiratory distress GCS of 6 and active bleeding in the oropharynx.  ED provider proceeded with mechanical intubation.  She was found to be febrile at 101.1 and tachycardic. ?Labs on admission pertinent for leukocytosis at 22.2, lactic acidosis at 3.8, elevated troponin at 214, mildly elevated liver enzymes.  CT chest with endobronchial material, concerning for aspiration. ?Admitted with severe sepsis secondary to MSSA pneumonia under PCCM service in ICU. ?Patient was initially started on cefepime and vancomycin, later transitioned to Unasyn and now on Augmentin. ? ?Her course of hospitalization was complicated by mucous plugging, persistent hypotension and PEG tube dislodgment. ?She was extubated on 08/12/2021, initially on heated high flow, now transition back to room air. ? ?Her PEG tube was replaced by IR on 08/17/2021. ? ?Patient remained on pressors until 08/21/2021, currently stable on midodrine and Florinef. ? ?Please see PCCM note for complete list of significant events and management. ? ?TRH to resume care on 08/22/2021. ? ?3/25: Patient appears quite confused when seen during morning rounds.  She was just making some eye contact when calling her name, not following any other commands.  Per nursing staff she was little more alert before and able to follow some commands. ?Nursing concern of watery diarrhea and leukocytosis.  No abdominal pain or tenderness, will ask ID regarding testing for C. difficile as she will remain high  risk. ? ?Palliative care was consulted to discuss goals of care as she is full code.  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 ? ? ?Assessment and Plan: ?* Acute respiratory failure ([place]) ?Patient with mucous plugging in right mainstem with right upper lobe collapse, intubated in ED.  Respiratory cultures positive for MSSA.  Received cefepime, vancomycin, followed by Unasyn and now on Augmentin. ?Extubated on 08/12/2021 to heated high flow-now weaned to room air. ?-Continue with Augmentin to complete a 2-week course. ?-Continue with bronchodilators ?-Continue with chest PT ? ?Septic shock ([place]) ?Patient initially admitted with severe sepsis and septic shock requiring pressors for many days.  Secondary to MSSA pneumonia.  Urine and blood cultures were negative. ?Currently blood pressure within goal with midodrine and Florinef. ?-Continue midodrine and Florinef ?-Continue to monitor ? ?MSSA (methicillin susceptible Staphylococcus aureus) pneumonia ([place]) ?Respiratory cultures positive for MSSA.  Patient initially received cefepime and vancomycin, followed by Unasyn and now on Augmentin.  She should be able to complete the course in a day. ?-Continue Augmentin for 1 more day. ?-Continue with supportive care ? ?Malfunction of percutaneous endoscopic gastrostomy (PEG) tube ([place]) ?S/p replacement by IR on 08/17/2021.  Currently functioning well. ?-Continue with current tube feed ? ?Hypokalemia ?Mild hypokalemia with potassium of 3.3, magnesium within normal limit. ?-Replete potassium and monitor ? ?Hypernatremia ?Sodium at 147 today with water deficit of 1.7. ?-Increase free water through tube from 200 mL every 4 hourly to 400 mL every 4 hourly. ?-Monitor sodium ? ?COPD with chronic bronchitis ([place]) ?Patient admitted with COPD exacerbation secondary to pneumonia. ?Has been resolved.  Now on room air. ?-Continue with as needed bronchodilators ? ?HTN (hypertension) ?Blood pressure  currently on softer side and patient is on midodrine  and Florinef. ?No antihypertensives needed. ?-Continue to monitor ? ?Non-insulin dependent type 2 diabetes mellitus ([place]) ?CBG within goal. ?-Continue with SSI ? ?Diarrhea ?Patient with watery diarrhea and worsening leukocytosis.  She is unable to explain whether there is any abdominal pain but no tenderness noted. ?Patient was also started on Florinef on 08/19/2021 which can be contributory to leukocytosis. ?She is high risk for C. difficile colitis. ?-Talked with ID and it is okay to proceed with testing, unable to place the orders as system warrant ID to place these orders, patient is here for more than 4 days. ?-Communicated with ID ?-Continue to monitor ? ?(HFpEF) heart failure with preserved ejection fraction ([place]) ?Patient has an history of chronic HFpEF.  Appears euvolemic. ?Mildly elevated troponin on admission, most likely secondary to demand ischemia with acute respiratory failure and sepsis. ?Echocardiogram done on 08/12/2021 with normal EF and grade 1 diastolic dysfunction.  Right ventricular systolic function low normal.. ?-Continue to monitor ? ?History of CVA (cerebrovascular accident) ?Patient has prior history of CVA with dysarthria, aphasia, and dysphagia along with left-sided residual deficit.  Patient is bedbound at baseline and lives at a skilled nursing facility.  No new deficit. ?-Continue with aspirin and statin ? ? ?  ? ?Subjective: Patient appears confused when seen today.  She was able to make a short eye contact when calling her name, not following any other meaningful command. ? ?Physical Exam: ?Vitals:  ? 08/22/21 0833 08/22/21 0900 08/22/21 1000 08/22/21 1131  ?BP:  97/67 92/65   ?Pulse: (!) 102 93 99   ?Resp: 16 (!) 24 (!) 24   ?Temp:    98.1 ?F (36.7 ?C)  ?TempSrc:    Axillary  ?SpO2: 95% 96% 96%   ?Weight:      ?Height:      ? ?General.  Chronically ill-appearing lady, in no acute distress. ?Pulmonary.  Lungs clear bilaterally, normal respiratory effort. ?CV.  Regular rate and  rhythm, no JVD, rub or murmur. ?Abdomen.  Soft, nontender, nondistended, BS positive. ?CNS.  Awake, not following any commands. ?Extremities.  No edema, no cyanosis, pulses intact and symmetrical. ?Psychiatry.  Judgment and insight appears impaired. ? ?Data Reviewed: ?I personally reviewed prior notes, labs and images. ? ?Family Communication: Talked with sister on phone. ? ?Disposition: ?Status is: Inpatient ?Remains inpatient appropriate because: Severity of illness ? ? Planned Discharge Destination: Skilled nursing facility ? ?DVT prophylaxis.  Lovenox ? ?Time spent: 52 minutes ? ?This record has been created using Systems analyst. Errors have been sought and corrected,but may not always be located. Such creation errors do not reflect on the standard of care. ? ?Author: ?Lorella Nimrod, MD ?08/22/2021 1:09 PM ? ?For on call review [email or web address].  ?

## 2021-08-23 DIAGNOSIS — R197 Diarrhea, unspecified: Secondary | ICD-10-CM | POA: Diagnosis not present

## 2021-08-23 DIAGNOSIS — Z8673 Personal history of transient ischemic attack (TIA), and cerebral infarction without residual deficits: Secondary | ICD-10-CM | POA: Diagnosis not present

## 2021-08-23 DIAGNOSIS — I5032 Chronic diastolic (congestive) heart failure: Secondary | ICD-10-CM | POA: Diagnosis not present

## 2021-08-23 DIAGNOSIS — J9601 Acute respiratory failure with hypoxia: Secondary | ICD-10-CM | POA: Diagnosis not present

## 2021-08-23 LAB — BASIC METABOLIC PANEL
Anion gap: 5 (ref 5–15)
BUN: 29 mg/dL — ABNORMAL HIGH (ref 8–23)
CO2: 32 mmol/L (ref 22–32)
Calcium: 8.6 mg/dL — ABNORMAL LOW (ref 8.9–10.3)
Chloride: 105 mmol/L (ref 98–111)
Creatinine, Ser: 0.56 mg/dL (ref 0.44–1.00)
GFR, Estimated: >60 mL/min (ref >60)
Glucose, Bld: 108 mg/dL — ABNORMAL HIGH (ref 70–99)
Potassium: 3.5 mmol/L (ref 3.5–5.1)
Sodium: 142 mmol/L (ref 135–145)

## 2021-08-23 LAB — GLUCOSE, CAPILLARY
Glucose-Capillary: 106 mg/dL — ABNORMAL HIGH (ref 70–99)
Glucose-Capillary: 110 mg/dL — ABNORMAL HIGH (ref 70–99)
Glucose-Capillary: 114 mg/dL — ABNORMAL HIGH (ref 70–99)
Glucose-Capillary: 124 mg/dL — ABNORMAL HIGH (ref 70–99)
Glucose-Capillary: 124 mg/dL — ABNORMAL HIGH (ref 70–99)
Glucose-Capillary: 85 mg/dL (ref 70–99)
Glucose-Capillary: 93 mg/dL (ref 70–99)

## 2021-08-23 LAB — CBC WITH DIFFERENTIAL/PLATELET
Abs Immature Granulocytes: 0.2 10*3/uL — ABNORMAL HIGH (ref 0.00–0.07)
Basophils Absolute: 0 10*3/uL (ref 0.0–0.1)
Basophils Relative: 0 %
Eosinophils Absolute: 0.4 10*3/uL (ref 0.0–0.5)
Eosinophils Relative: 2 %
HCT: 34.2 % — ABNORMAL LOW (ref 36.0–46.0)
Hemoglobin: 10.9 g/dL — ABNORMAL LOW (ref 12.0–15.0)
Immature Granulocytes: 1 %
Lymphocytes Relative: 35 %
Lymphs Abs: 6.9 10*3/uL — ABNORMAL HIGH (ref 0.7–4.0)
MCH: 31.9 pg (ref 26.0–34.0)
MCHC: 31.9 g/dL (ref 30.0–36.0)
MCV: 100 fL (ref 80.0–100.0)
Monocytes Absolute: 0.6 10*3/uL (ref 0.1–1.0)
Monocytes Relative: 3 %
Neutro Abs: 11.8 10*3/uL — ABNORMAL HIGH (ref 1.7–7.7)
Neutrophils Relative %: 59 %
Platelets: 141 10*3/uL — ABNORMAL LOW (ref 150–400)
RBC: 3.42 MIL/uL — ABNORMAL LOW (ref 3.87–5.11)
RDW: 16.3 % — ABNORMAL HIGH (ref 11.5–15.5)
WBC: 19.9 10*3/uL — ABNORMAL HIGH (ref 4.0–10.5)
nRBC: 0.4 % — ABNORMAL HIGH (ref 0.0–0.2)

## 2021-08-23 LAB — MAGNESIUM: Magnesium: 2 mg/dL (ref 1.7–2.4)

## 2021-08-23 LAB — PHOSPHORUS: Phosphorus: 3.3 mg/dL (ref 2.5–4.6)

## 2021-08-23 MED ORDER — FREE WATER
250.0000 mL | Status: DC
Start: 2021-08-23 — End: 2021-08-25
  Administered 2021-08-23 – 2021-08-24 (×8): 250 mL

## 2021-08-23 MED ORDER — FUROSEMIDE 10 MG/ML IJ SOLN: 40.0000 mg | Freq: Once | INTRAMUSCULAR | Status: DC

## 2021-08-23 MED ORDER — BUDESONIDE 0.25 MG/2ML IN SUSP: 0.2500 mg | Freq: Two times a day (BID) | RESPIRATORY_TRACT | Status: DC | PRN

## 2021-08-23 MED ORDER — IPRATROPIUM-ALBUTEROL 0.5-2.5 (3) MG/3ML IN SOLN: 3.0000 mL | Freq: Two times a day (BID) | RESPIRATORY_TRACT | Status: DC | PRN

## 2021-08-23 MED ORDER — LOPERAMIDE HCL 2 MG/15ML PO SOLN
2.0000 mg | Freq: Four times a day (QID) | ORAL | Status: DC | PRN
  Administered 2021-08-23: 2 mg
  Filled 2021-08-23 (×3): qty 15

## 2021-08-23 MED ORDER — FUROSEMIDE 10 MG/ML IJ SOLN
20.0000 mg | Freq: Once | INTRAMUSCULAR | Status: AC
  Administered 2021-08-23: 20 mg via INTRAVENOUS
  Filled 2021-08-23: qty 2

## 2021-08-23 NOTE — Assessment & Plan Note (Signed)
Patient has an history of chronic HFpEF.  Few basal crackles today. ?Mildly elevated troponin on admission, most likely secondary to demand ischemia with acute respiratory failure and sepsis. ?Echocardiogram done on 08/12/2021 with normal EF and grade 1 diastolic dysfunction.  Right ventricular systolic function low normal.. ?-Give her one-time dose of IV Lasix 20 mg ?-Continue to monitor ?

## 2021-08-23 NOTE — Assessment & Plan Note (Signed)
Resolved, magnesium also within normal limit. ?-Replete potassium as needed and monitor ?

## 2021-08-23 NOTE — Assessment & Plan Note (Signed)
Sodium improved to 142 this morning after increasing free water intake. ?-Decrease free water intake to 250 mg every 4 hourly as she sounds little wet. ?-Monitor sodium ?

## 2021-08-23 NOTE — Assessment & Plan Note (Signed)
Respiratory cultures positive for MSSA.  Patient initially received cefepime and vancomycin, followed by Unasyn and now on Augmentin.  She should be able to complete the course in a day. ?-Continue Augmentin for 1 more day. ?-Continue with supportive care ?

## 2021-08-23 NOTE — Plan of Care (Signed)

## 2021-08-23 NOTE — Assessment & Plan Note (Signed)
Patient initially admitted with severe sepsis and septic shock requiring pressors for many days.  Secondary to MSSA pneumonia.  Urine and blood cultures were negative. ?Currently blood pressure within goal with midodrine and Florinef. ?-Continue midodrine and Florinef ?-Continue to monitor ?

## 2021-08-23 NOTE — Progress Notes (Signed)
?Progress Note ? ? ?Patient: Kaitlyn Ruiz [id number] DOB: 23-Oct-1955 DOA: 08/11/2021     12 ?DOS: the patient was seen and examined on 08/23/2021 ?  ?Brief hospital course: ?ICU transfer. ? ?Taken from prior notes. ? ?[name], 66 year old female with past medical history significant for extensive CVA with left-sided deficit, nonverbal and bedbound, PEG tube, COPD, type 2 diabetes and hypertension presented to [facility] ER on 03/14 from [facility] via EMS with acute respiratory distress GCS of 6 and active bleeding in the oropharynx.  ED provider proceeded with mechanical intubation.  She was found to be febrile at 101.1 and tachycardic. ?Labs on admission pertinent for leukocytosis at 22.2, lactic acidosis at 3.8, elevated troponin at 214, mildly elevated liver enzymes.  CT chest with endobronchial material, concerning for aspiration. ?Admitted with severe sepsis secondary to MSSA pneumonia under PCCM service in ICU. ?Patient was initially started on cefepime and vancomycin, later transitioned to Unasyn and now on Augmentin. ? ?Her course of hospitalization was complicated by mucous plugging, persistent hypotension and PEG tube dislodgment. ?She was extubated on 08/12/2021, initially on heated high flow, now transition back to room air. ? ?Her PEG tube was replaced by IR on 08/17/2021. ? ?Patient remained on pressors until 08/21/2021, currently stable on midodrine and Florinef. ? ?Please see PCCM note for complete list of significant events and management. ? ?TRH to resume care on 08/22/2021. ? ?3/25: Patient appears quite confused when seen during morning rounds.  She was just making some eye contact when calling her name, not following any other commands.  Per nursing staff she was little more alert before and able to follow some commands. ?Nursing concern of watery diarrhea and leukocytosis.  No abdominal pain or tenderness, will ask ID regarding testing for C. difficile as she will remain high  risk. ? ?3/26: Patient little more alert and following commands today.  She was able to speak couple of words, significant dysarthria.  Having some upper respiratory secretions and few basal crackles, ordered one-time dose of IV Lasix 20 mg. ?Also decreased free water intake to 250 mL every 4 hourly as hypernatremia has been resolved. ?Family wants a different SNF on discharge. ? ?Palliative care was consulted to discuss goals of care as she is full code.  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 ? ? ?Assessment and Plan: ?* Acute respiratory failure ([place]) ?Patient with mucous plugging in right mainstem with right upper lobe collapse, intubated in ED.  Respiratory cultures positive for MSSA.  Received cefepime, vancomycin, followed by Unasyn and now on Augmentin. ?Extubated on 08/12/2021 to heated high flow-now weaned to room air. ?-Continue with Augmentin to complete a 2-week course. ?-Continue with bronchodilators ?-Continue with chest PT ? ?Septic shock ([place]) ?Patient initially admitted with severe sepsis and septic shock requiring pressors for many days.  Secondary to MSSA pneumonia.  Urine and blood cultures were negative. ?Currently blood pressure within goal with midodrine and Florinef. ?-Continue midodrine and Florinef ?-Continue to monitor ? ?MSSA (methicillin susceptible Staphylococcus aureus) pneumonia ([place]) ?Respiratory cultures positive for MSSA.  Patient initially received cefepime and vancomycin, followed by Unasyn and now on Augmentin.  She should be able to complete the course in a day. ?-Continue Augmentin for 1 more day. ?-Continue with supportive care ? ?Malfunction of percutaneous endoscopic gastrostomy (PEG) tube ([place]) ?S/p replacement by IR on 08/17/2021.  Currently functioning well. ?-Continue with current tube feed ? ?Hypokalemia ?Resolved, magnesium also within normal limit. ?-Replete potassium as needed and monitor ? ?Hypernatremia ?  Sodium improved to 142 this morning after increasing free water  intake. ?-Decrease free water intake to 250 mg every 4 hourly as she sounds little wet. ?-Monitor sodium ? ?COPD with chronic bronchitis ([place]) ?Patient admitted with COPD exacerbation secondary to pneumonia. ?Has been resolved.  Now on room air. ?-Continue with as needed bronchodilators ? ?HTN (hypertension) ?Blood pressure currently on softer side and patient is on midodrine and Florinef. ?No antihypertensives needed. ?-Continue to monitor ? ?Non-insulin dependent type 2 diabetes mellitus ([place]) ?CBG within goal. ?-Continue with SSI ? ?Diarrhea ?Patient with watery diarrhea and worsening leukocytosis.  She is unable to explain whether there is any abdominal pain but no tenderness noted. ?Patient was also started on Florinef on 08/19/2021 which can be contributory to leukocytosis. ?C. difficile was negative. ?-Continue to monitor ? ?(HFpEF) heart failure with preserved ejection fraction ([place]) ?Patient has an history of chronic HFpEF.  Few basal crackles today. ?Mildly elevated troponin on admission, most likely secondary to demand ischemia with acute respiratory failure and sepsis. ?Echocardiogram done on 08/12/2021 with normal EF and grade 1 diastolic dysfunction.  Right ventricular systolic function low normal.. ?-Give her one-time dose of IV Lasix 20 mg ?-Continue to monitor ? ?History of CVA (cerebrovascular accident) ?Patient has prior history of CVA with dysarthria, aphasia, and dysphagia along with left-sided residual deficit.  Patient is bedbound at baseline and lives at a skilled nursing facility.  No new deficit. ?-Continue with aspirin and statin ? ? ?  ? ?Subjective: Patient was more alert and trying to speak couple of words when seen today.  She was following commands. ? ?Physical Exam: ?Vitals:  ? 08/23/21 0800 08/23/21 1000 08/23/21 1022 08/23/21 1400  ?BP: 116/67 (!) 88/75 96/69 106/66  ?Pulse: (!) 114 (!) 39 88 (!) 101  ?Resp: (!) 29 (!) 22 (!) 23 (!) 35  ?Temp:    97.8 ?F (36.6 ?C)  ?TempSrc:     Axillary  ?SpO2: 94% 100% 91% 92%  ?Weight:      ?Height:      ? ?General.  Ill-appearing lady, in no acute distress. ?Pulmonary.  Lungs clear bilaterally, normal respiratory effort. ?CV.  Regular rate and rhythm, no JVD, rub or murmur. ?Abdomen.  Soft, nontender, nondistended, BS positive. ?CNS.  Alert and following commands, left hemiparesis. ?Extremities.  No edema, no cyanosis, pulses intact and symmetrical. ?Psychiatry.  Judgment and insight appears impaired. ? ?Data Reviewed: ?Prior notes and labs reviewed ? ?Family Communication:  ? ?Disposition: ?Status is: Inpatient ?Remains inpatient appropriate because: Severity of illness ? ? Planned Discharge Destination: Skilled nursing facility ? ?DVT prophylaxis.  Lovenox ? ?Time spent: 50 minutes ? ?This record has been created using Systems analyst. Errors have been sought and corrected,but may not always be located. Such creation errors do not reflect on the standard of care. ? ?Author: ?Lorella Nimrod, MD ?08/23/2021 2:56 PM ? ?For on call review [email or web address].  ?

## 2021-08-23 NOTE — Assessment & Plan Note (Signed)
Blood pressure currently on softer side and patient is on midodrine and Florinef. ?No antihypertensives needed. ?-Continue to monitor ?

## 2021-08-23 NOTE — Assessment & Plan Note (Signed)
Patient with watery diarrhea and worsening leukocytosis.  She is unable to explain whether there is any abdominal pain but no tenderness noted. ?Patient was also started on Florinef on 08/19/2021 which can be contributory to leukocytosis. ?C. difficile was negative. ?-Continue to monitor ?

## 2021-08-24 DIAGNOSIS — I69391 Dysphagia following cerebral infarction: Secondary | ICD-10-CM

## 2021-08-24 DIAGNOSIS — I69322 Dysarthria following cerebral infarction: Secondary | ICD-10-CM

## 2021-08-24 DIAGNOSIS — K9423 Gastrostomy malfunction: Secondary | ICD-10-CM

## 2021-08-24 DIAGNOSIS — R531 Weakness: Secondary | ICD-10-CM

## 2021-08-24 DIAGNOSIS — J449 Chronic obstructive pulmonary disease, unspecified: Secondary | ICD-10-CM

## 2021-08-24 LAB — CBC WITH DIFFERENTIAL/PLATELET
Abs Immature Granulocytes: 0.12 10*3/uL — ABNORMAL HIGH (ref 0.00–0.07)
Basophils Absolute: 0 10*3/uL (ref 0.0–0.1)
Basophils Relative: 0 %
Eosinophils Absolute: 0.5 10*3/uL (ref 0.0–0.5)
Eosinophils Relative: 3 %
HCT: 33.2 % — ABNORMAL LOW (ref 36.0–46.0)
Hemoglobin: 10.5 g/dL — ABNORMAL LOW (ref 12.0–15.0)
Immature Granulocytes: 1 %
Lymphocytes Relative: 26 %
Lymphs Abs: 4.9 10*3/uL — ABNORMAL HIGH (ref 0.7–4.0)
MCH: 31.5 pg (ref 26.0–34.0)
MCHC: 31.6 g/dL (ref 30.0–36.0)
MCV: 99.7 fL (ref 80.0–100.0)
Monocytes Absolute: 0.5 10*3/uL (ref 0.1–1.0)
Monocytes Relative: 3 %
Neutro Abs: 12.9 10*3/uL — ABNORMAL HIGH (ref 1.7–7.7)
Neutrophils Relative %: 67 %
Platelets: 119 10*3/uL — ABNORMAL LOW (ref 150–400)
RBC: 3.33 MIL/uL — ABNORMAL LOW (ref 3.87–5.11)
RDW: 16 % — ABNORMAL HIGH (ref 11.5–15.5)
Smear Review: NORMAL
WBC: 18.9 10*3/uL — ABNORMAL HIGH (ref 4.0–10.5)
nRBC: 0.4 % — ABNORMAL HIGH (ref 0.0–0.2)

## 2021-08-24 LAB — BASIC METABOLIC PANEL
Anion gap: 6 (ref 5–15)
BUN: 26 mg/dL — ABNORMAL HIGH (ref 8–23)
CO2: 32 mmol/L (ref 22–32)
Calcium: 8.3 mg/dL — ABNORMAL LOW (ref 8.9–10.3)
Chloride: 101 mmol/L (ref 98–111)
Creatinine, Ser: 0.41 mg/dL — ABNORMAL LOW (ref 0.44–1.00)
GFR, Estimated: >60 mL/min (ref >60)
Glucose, Bld: 101 mg/dL — ABNORMAL HIGH (ref 70–99)
Potassium: 3.2 mmol/L — ABNORMAL LOW (ref 3.5–5.1)
Sodium: 139 mmol/L (ref 135–145)

## 2021-08-24 LAB — GLUCOSE, CAPILLARY
Glucose-Capillary: 93 mg/dL (ref 70–99)
Glucose-Capillary: 122 mg/dL — ABNORMAL HIGH (ref 70–99)

## 2021-08-24 LAB — RESP PANEL BY RT-PCR (FLU A&B, COVID) ARPGX2
Influenza A by PCR: NEGATIVE
Influenza B by PCR: NEGATIVE
SARS Coronavirus 2 by RT PCR: NEGATIVE

## 2021-08-24 LAB — MAGNESIUM: Magnesium: 1.7 mg/dL (ref 1.7–2.4)

## 2021-08-24 LAB — PHOSPHORUS: Phosphorus: 3.3 mg/dL (ref 2.5–4.6)

## 2021-08-24 MED ORDER — POTASSIUM CHLORIDE 20 MEQ PO PACK
40.0000 meq | PACK | Freq: Once | ORAL | Status: AC
  Administered 2021-08-24: 40 meq
  Filled 2021-08-24: qty 2

## 2021-08-24 MED ORDER — FUROSEMIDE 10 MG/ML IJ SOLN
20.0000 mg | Freq: Every day | INTRAMUSCULAR | Status: DC
  Administered 2021-08-24: 20 mg via INTRAVENOUS
  Filled 2021-08-24: qty 2

## 2021-08-24 MED ORDER — ZINC OXIDE 40 % EX OINT: TOPICAL_OINTMENT | CUTANEOUS | 0 refills | Status: AC | PRN

## 2021-08-24 MED ORDER — FLUDROCORTISONE ACETATE 0.1 MG PO TABS: 0.1000 mg | ORAL_TABLET | Freq: Every day | ORAL | Status: DC

## 2021-08-24 MED ORDER — POLYETHYLENE GLYCOL 3350 17 G PO PACK: 17.0000 g | PACK | Freq: Every day | ORAL | 0 refills | Status: AC | PRN

## 2021-08-24 MED ORDER — INSULIN ASPART 100 UNIT/ML IJ SOLN: 3.0000 [IU] | INTRAMUSCULAR | 11 refills | Status: DC

## 2021-08-24 MED ORDER — NUTRISOURCE FIBER PO PACK: 1.0000 | PACK | Freq: Two times a day (BID) | ORAL | Status: DC

## 2021-08-24 MED ORDER — MIDODRINE HCL 5 MG PO TABS: 15.0000 mg | ORAL_TABLET | Freq: Three times a day (TID) | ORAL | Status: DC

## 2021-08-24 MED ORDER — IPRATROPIUM-ALBUTEROL 0.5-2.5 (3) MG/3ML IN SOLN: 3.0000 mL | Freq: Two times a day (BID) | RESPIRATORY_TRACT | Status: AC | PRN

## 2021-08-24 MED ORDER — MAGNESIUM SULFATE 2 GM/50ML IV SOLN
2.0000 g | Freq: Once | INTRAVENOUS | Status: AC
  Administered 2021-08-24: 2 g via INTRAVENOUS
  Filled 2021-08-24: qty 50

## 2021-08-24 MED ORDER — LOPERAMIDE HCL 2 MG/15ML PO SOLN: 2.0000 mg | Freq: Four times a day (QID) | ORAL | 0 refills | Status: AC | PRN

## 2021-08-24 NOTE — Assessment & Plan Note (Signed)
Patient initially admitted with severe sepsis and septic shock requiring pressors for many days.  Secondary to MSSA pneumonia.  Urine and blood cultures were negative. ?Currently blood pressure within goal with midodrine and Florinef. ?-Continue midodrine and Florinef ?-Continue to monitor ?

## 2021-08-24 NOTE — Discharge Summary (Signed)
?Physician Discharge Summary ?  ?Patient: Kaitlyn Ruiz MRN: [id number] DOB: 1955/08/13  ?Admit date:     08/11/2021  ?Discharge date: 08/24/21  ?Discharge Physician: Lorella Nimrod  ? ?PCP: Marco Collie, MD  ? ?Recommendations at discharge:  ?Please obtain CBC and BMP in 1 week ?Patient is on tube feed-follow the directions and obtain nutritional evaluation periodically. ?Patient needs frequent position change to prevent pressure injuries. ?Continue with chest PT to avoid any more mucous plugging or infection. ? ?Discharge Diagnoses: ?Principal Problem: ?  Acute respiratory failure ([place]) ?Active Problems: ?  Septic shock ([place]) ?  MSSA (methicillin susceptible Staphylococcus aureus) pneumonia ([place]) ?  Malfunction of percutaneous endoscopic gastrostomy (PEG) tube ([place]) ?  Hypernatremia ?  Hypokalemia ?  COPD with chronic bronchitis ([place]) ?  HTN (hypertension) ?  Non-insulin dependent type 2 diabetes mellitus ([place]) ?  Diarrhea ?  (HFpEF) heart failure with preserved ejection fraction ([place]) ?  History of CVA (cerebrovascular accident) ? ?Resolved Problems: ?  Encounter for central line placement ?  Acute respiratory distress ? ?Hospital Course: ?ICU transfer. ? ?Taken from prior notes. ? ?[name], 66 year old female with past medical history significant for extensive CVA with left-sided deficit, nonverbal and bedbound, PEG tube, COPD, type 2 diabetes and hypertension presented to [facility] ER on 03/14 from [facility] via EMS with acute respiratory distress GCS of 6 and active bleeding in the oropharynx.  ED provider proceeded with mechanical intubation.  She was found to be febrile at 101.1 and tachycardic. ?Labs on admission pertinent for leukocytosis at 22.2, lactic acidosis at 3.8, elevated troponin at 214, mildly elevated liver enzymes.  CT chest with endobronchial material, concerning for aspiration. ?Admitted with severe sepsis secondary to MSSA pneumonia under PCCM service in ICU. ?Patient was initially  started on cefepime and vancomycin, later transitioned to Unasyn and now on Augmentin. ? ?Her course of hospitalization was complicated by mucous plugging, persistent hypotension and PEG tube dislodgment. ?She was extubated on 08/12/2021, initially on heated high flow, now transition back to room air. ? ?Her PEG tube was replaced by IR on 08/17/2021. ? ?Patient remained on pressors until 08/21/2021, currently stable on midodrine and Florinef. ? ?Please see PCCM note for complete list of significant events and management. ? ?TRH to resume care on 08/22/2021. ? ?3/25: Patient appears quite confused when seen during morning rounds.  She was just making some eye contact when calling her name, not following any other commands.  Per nursing staff she was little more alert before and able to follow some commands. ?Nursing concern of watery diarrhea and leukocytosis.  No abdominal pain or tenderness, will ask ID regarding testing for C. difficile as she will remain high risk. ? ?3/26: Patient little more alert and following commands today.  She was able to speak couple of words, significant dysarthria.  Having some upper respiratory secretions and few basal crackles, ordered one-time dose of IV Lasix 20 mg. ?Also decreased free water intake to 250 mL every 4 hourly as hypernatremia has been resolved. ?Family wants a different SNF on discharge. ? ?3/27: Patient currently stable, saturating 100% on 2 L of oxygen which is her baseline.  Transfer to progressive care unit.  TOC was able to find her a new place who can take at today. ?Patient is being discharged on tube feed as she was doing before coming to the hospital. ? ?Palliative care was consulted to discuss goals of care as she is full code.  Bedbound and aphasic  at baseline.  Outpatient palliative care referral was provided. ? ? ?Assessment and Plan: ?* Acute respiratory failure ([place]) ?Patient with mucous plugging in right mainstem with right upper lobe collapse, intubated  in ED.  Respiratory cultures positive for MSSA.  Received cefepime, vancomycin, followed by Unasyn and then completed a course with Augmentin ?Extubated on 08/12/2021 to heated high flow-now weaned to room air. ?-Continue with bronchodilators ?-Continue with chest PT ? ?Septic shock ([place]) ?Patient initially admitted with severe sepsis and septic shock requiring pressors for many days.  Secondary to MSSA pneumonia.  Urine and blood cultures were negative. ?Currently blood pressure within goal with midodrine and Florinef. ?-Continue midodrine and Florinef ?-Continue to monitor ? ?MSSA (methicillin susceptible Staphylococcus aureus) pneumonia ([place]) ?Respiratory cultures positive for MSSA.  Patient initially received cefepime and vancomycin, followed by Unasyn and then Augmentin and completed the course. ?-Continue with supportive care ? ?Malfunction of percutaneous endoscopic gastrostomy (PEG) tube ([employer]) ?S/p replacement by IR on 08/17/2021.  Currently functioning well. ?-Continue with current tube feed ? ?Hypokalemia ?Resolved, magnesium also within normal limit. ?-Replete potassium as needed and monitor ? ?Hypernatremia ?Sodium improved to 142 this morning after increasing free water intake. ?-Decrease free water intake to 250 mg every 4 hourly as she sounds little wet. ?-Monitor sodium ? ?COPD with chronic bronchitis ([place]) ?Patient admitted with COPD exacerbation secondary to pneumonia. ?Has been resolved.  Now on room air. ?-Continue with as needed bronchodilators ? ?HTN (hypertension) ?Blood pressure currently on softer side and patient is on midodrine and Florinef. ?No antihypertensives needed. ?-Continue to monitor ? ?Non-insulin dependent type 2 diabetes mellitus ([place]) ?CBG within goal. ?-Continue with SSI ? ?Diarrhea ?Patient with watery diarrhea and worsening leukocytosis.  She is unable to explain whether there is any abdominal pain but no tenderness noted. ?Patient was also started on Florinef on 08/19/2021  which can be contributory to leukocytosis. ?C. difficile was negative. ?-Continue to monitor ? ?(HFpEF) heart failure with preserved ejection fraction ([place]) ?Patient has an history of chronic HFpEF.  Few basal crackles today. ?Mildly elevated troponin on admission, most likely secondary to demand ischemia with acute respiratory failure and sepsis. ?Echocardiogram done on 08/12/2021 with normal EF and grade 1 diastolic dysfunction.  Right ventricular systolic function low normal.. ?-Give her one-time dose of IV Lasix 20 mg ?-Continue to monitor ? ?History of CVA (cerebrovascular accident) ?Patient has prior history of CVA with dysarthria, aphasia, and dysphagia along with left-sided residual deficit.  Patient is bedbound at baseline and lives at a skilled nursing facility.  No new deficit. ?-Continue with aspirin and statin ? ? ?  ? ? ?Consultants: PCCM, IR ?Procedures performed: IR guided G-tube replacement, endotracheal intubation. ?Disposition: Skilled nursing facility ?Diet recommendation:  ?Discharge Diet Orders (From admission, onward)  ? ?  Start     Ordered  ? 08/24/21 0000  Diet - low sodium heart healthy       ? 08/24/21 1517  ? ?  ?  ? ?  ? ?NPO on tube feed ?DISCHARGE MEDICATION: ?Allergies as of 08/24/2021   ?No Known Allergies ?  ? ?  ?Medication List  ?  ? ?STOP taking these medications   ? ?lisinopril 2.5 MG tablet ?Commonly known as: ZESTRIL ?  ?scopolamine 1 MG/3DAYS ?Commonly known as: TRANSDERM-SCOP ?  ? ?  ? ?TAKE these medications   ? ?acidophilus Caps capsule ?Take 1 capsule by mouth 3 (three) times daily. ?  ?albuterol 108 (90 Base) MCG/ACT inhaler ?Commonly known as: VENTOLIN  HFA ?Inhale 2 puffs into the lungs every 4 (four) hours as needed for wheezing or shortness of breath. ?  ?aspirin 81 MG chewable tablet ?Place 1 tablet (81 mg total) into feeding tube daily. ?  ?budesonide 0.25 MG/2ML nebulizer solution ?Commonly known as: PULMICORT ?Take 2 mLs (0.25 mg total) by nebulization 2 (two)  times daily. ?  ?Cholecalciferol 1.25 MG (50000 UT) Tabs ?1 tablet by PEG Tube route as directed. Every 60 days ?  ?citalopram 10 MG tablet ?Commonly known as: CELEXA ?Place 10 mg into feeding tube daily. V

## 2021-08-24 NOTE — TOC Progression Note (Addendum)
Transition of Care (TOC) - Progression Note  ? ? ?Patient Details  ?Name: Kaitlyn Ruiz ?MRN: [id number] ?Date of Birth: 1955/12/29 ? ?Transition of Care (TOC) CM/SW Contact  ?Margarito Liner, LCSW ?Phone Number: ?08/24/2021, 10:57 AM ? ?Clinical Narrative:  Received consult that family wants to look into other LTC SNF's. Per last RN assessment, patient AOx1. Called sister and she confirmed request. Did tell her that it may be difficult to find another LTC facility. Sent referral out to other [facility] facilities for review. [facility], [facility], and [facility] confirmed they have female long-term beds available.  ? ?1:34 pm: Sister has accepted bed offer from [facility]. Left voicemail for admissions coordinator asking him to call her in regards to paperwork, switching her Medicaid check to their facility, etc. ? ?1:56 pm: Told sister if [facility] cannot accept her today, patient will need to return to [facility] until arrangements can be made. ? ?2:37 pm: [facility] is trying to make arrangements to accept patient today. Admissions coordinator will call sister now. ? ?3:03 pm: [facility] can accept patient today. Sent secure chat to MD to notify and asked her for a rapid COVID test. ? ?Expected Discharge Plan: Skilled Nursing Facility ?Barriers to Discharge: Continued Medical Work up ? ?Expected Discharge Plan and Services ?Expected Discharge Plan: Skilled Nursing Facility ?  ?Discharge Planning Services: CM Consult ?Post Acute Care Choice: Resumption of Svcs/PTA Provider, Skilled Nursing Facility ?Living arrangements for the past 2 months: Single Family Home ?                ?DME Arranged: N/A ?DME Agency: NA ?  ?  ?  ?HH Arranged: NA ?  ?  ?  ?  ? ? ?Social Determinants of Health (SDOH) Interventions ?  ? ?Readmission Risk Interventions ? ?  08/14/2021  ? 10:11 AM  ?Readmission Risk Prevention Plan  ?Transportation Screening Complete  ?PCP or  Specialist Appt within 3-5 Days Complete  ?HRI or Home Care Consult Complete  ?Social Work Consult for Recovery Care Planning/Counseling Complete  ?Medication Review [employer]) Complete  ? ? ?

## 2021-08-24 NOTE — [place] FL2 (Signed)
?[place] MEDICAID FL2 LEVEL OF CARE SCREENING TOOL  ?  ? ?IDENTIFICATION  ?Patient Name: ?Kaitlyn Ruiz Birthdate: 04-30-1956 Sex: female Admission Date (Current Location): ?08/11/2021  ?[place] and [place] Number: ? [place] ?  Facility and Address:  ?[facility], [address] [place], [place] [address] ?     Provider Number: ?[id number]  ?Attending Physician Name and Address:  ?Lorella Nimrod, MD ? Relative Name and Phone Number:  ?Katy Fitch [phone] ?   ?Current Level of Care: ?Hospital Recommended Level of Care: ?[place] Prior Approval Number: ?  ? ?Date Approved/Denied: ?  PASRR Number: ?[id number] ? ?Discharge Plan: ?SNF ?  ? ?Current Diagnoses: ?Patient Active Problem List  ? Diagnosis Date Noted  ? Hypernatremia 08/22/2021  ? MSSA (methicillin susceptible Staphylococcus aureus) pneumonia ([place]) 08/22/2021  ? Diarrhea 08/22/2021  ? (HFpEF) heart failure with preserved ejection fraction ([place]) 08/22/2021  ? History of CVA (cerebrovascular accident) 08/22/2021  ? Hypokalemia 08/22/2021  ? Acute respiratory failure ([place]) 08/11/2021  ? Cerebral artery occlusion with cerebral infarction ([place]) 08/04/2021  ? Malfunction of percutaneous endoscopic gastrostomy (PEG) tube ([place]) 08/03/2021  ? Chronic hepatitis C ([place]) 08/03/2021  ? Depression 08/03/2021  ? Tobacco use disorder 08/03/2021  ? 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 Acute on chronic respiratory failure with hypoxia ([place]) 05/26/2019  ? COPD with chronic bronchitis ([place]) 05/26/2019  ? Non-insulin dependent type 2 diabetes mellitus ([place]) 05/26/2019  ? UTI (urinary tract infection) 05/26/2019  ? 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 Dysphagia as late effect of cerebrovascular accident (CVA) 05/26/2019  ? Appetite impaired 08/15/2018  ? Anorexia 06/05/2018  ? Weakness generalized 06/05/2018  ? Esophageal dysphagia   ? Oropharyngeal dysphagia   ? Neurological  dysfunction   ? Endotracheal tube present   ? Palliative care encounter   ? Acute respiratory failure with hypoxemia ([place]) 05/14/2018  ? Acute metabolic encephalopathy   ? Septic shock ([place]) 04/10/2017  ? HTN (hypertension) 12/28/2013  ? ? ?Orientation RESPIRATION BLADDER Height & Weight   ?  ?Self (Fluctuating) ? O2 (Nasal cannula 2 L) Incontinent, External catheter Weight: 165 lb 12.6 oz (75.2 kg) ?Height:  5\' 6"  (167.6 cm)  ?BEHAVIORAL SYMPTOMS/MOOD NEUROLOGICAL BOWEL NUTRITION STATUS  ? (None)  (History of CVA) Incontinent Feeding tube (G-tube)  ?AMBULATORY STATUS COMMUNICATION OF NEEDS Skin   ?Total Care Verbally Skin abrasions, Bruising, Other (Comment) (Erythema/redness, rash.) ?  ?  ?  ?    ?     ?     ? ? ?Personal Care Assistance Level of Assistance  ?Total care   ?  ?  ?Total Care Assistance: Maximum assistance  ? ?Functional Limitations Info  ?Sight, Hearing, Speech Sight Info: Adequate ?Hearing Info: Adequate ?Speech Info: Impaired (Slurred/Dysarthria)  ? ? ?SPECIAL CARE FACTORS FREQUENCY  ?    ?  ?  ?  ?  ?  ?  ?   ? ? ?Contractures Contractures Info: Not present (Per RN, has left-sided deficits/weakness but not extremely contracted.)  ? ? ?Additional Factors Info  ?Code Status, Allergies Code Status Info: Full code ?Allergies Info: NKDA ?  ?  ?  ?   ? ?Current Medications (08/24/2021):  This is the current hospital active medication list ?Current Facility-Administered Medications  ?Medication Dose Route Frequency Provider Last Rate Last Admin  ? 0.9 %  sodium chloride infusion  250 mL Intravenous Continuous Naaman Plummer, MD 10 mL/hr at 08/23/21 1300 Infusion  Verify at 08/23/21 1300  ? acetaminophen (TYLENOL) suppository 650 mg  650 mg Rectal Q6H PRN Teressa Lower, NP   650 mg at 08/12/21 1034  ? acetaminophen (TYLENOL) tablet 650 mg  650 mg Per Tube Q6H PRN Flora Lipps, MD   650 mg at 08/24/21 0413  ? aspirin chewable tablet 81 mg  81 mg Per Tube Daily Bennie Pierini, MD   81 mg at 08/24/21  1011  ? budesonide (PULMICORT) nebulizer solution 0.25 mg  0.25 mg Nebulization BID PRN Lorella Nimrod, MD      ? Chlorhexidine Gluconate Cloth 2 % PADS 6 each  6 each Topical Daily Flora Lipps, MD   6 each at 08/23/21 [id number]  ? citalopram (CELEXA) tablet 10 mg  10 mg Per Tube Daily Teressa Lower, NP   10 mg at 08/24/21 1011  ? docusate (COLACE) 50 MG/5ML liquid 100 mg  100 mg Per Tube BID PRN Teressa Lower, NP      ? enoxaparin (LOVENOX) injection 40 mg  40 mg Subcutaneous QHS Flora Lipps, MD   40 mg at 08/23/21 2054  ? feeding supplement (GLUCERNA 1.5 CAL) liquid 1,000 mL  1,000 mL Per Tube Continuous Flora Lipps, MD 50 mL/hr at 08/22/21 1543 Rate Change at 08/22/21 1543  ? fiber (NUTRISOURCE FIBER) 1 packet  1 packet Per Tube BID Tyler Pita, MD   1 packet at 08/24/21 1011  ? fludrocortisone (FLORINEF) tablet 0.2 mg  0.2 mg Per Tube Daily Mortimer Fries, Kurian, MD   0.2 mg at 08/24/21 1011  ? free water 250 mL  250 mL Per Tube Q4H Lorella Nimrod, MD   250 mL at 08/24/21 1012  ? gabapentin (NEURONTIN) 250 MG/5ML solution 300 mg  300 mg Per Tube Barnabas Lister, MD   300 mg at 08/24/21 0550  ? insulin aspart (novoLOG) injection 3-9 Units  3-9 Units Subcutaneous Q4H Flora Lipps, MD   3 Units at 08/23/21 2324  ? ipratropium-albuterol (DUONEB) 0.5-2.5 (3) MG/3ML nebulizer solution 3 mL  3 mL Nebulization BID PRN Lorella Nimrod, MD      ? liver oil-zinc oxide (DESITIN) 40 % ointment   Topical PRN Teressa Lower, NP      ? loperamide HCl (IMODIUM) 2 MG/15ML solution 2 mg  2 mg Per Tube QID PRN Lorella Nimrod, MD   2 mg at 08/23/21 1130  ? magnesium sulfate IVPB 2 g 50 mL  2 g Intravenous Once Lorella Nimrod, MD 50 mL/hr at 08/24/21 1029 2 g at 08/24/21 1029  ? MEDLINE mouth rinse  15 mL Mouth Rinse BID Flora Lipps, MD   15 mL at 08/24/21 1022  ? midodrine (PROAMATINE) tablet 15 mg  15 mg Per Tube TID WC Bennie Pierini, MD   15 mg at 08/24/21 1011  ? nutrition supplement (JUVEN) (JUVEN) powder packet 1 packet  1  packet Per Tube BID BM Flora Lipps, MD   1 packet at 08/24/21 1011  ? ondansetron (ZOFRAN) injection 4 mg  4 mg Intravenous Q6H PRN Lang Snow, NP      ? polyethylene glycol (MIRALAX / GLYCOLAX) packet 17 g  17 g Per Tube Daily PRN Teressa Lower, NP      ? pravastatin (PRAVACHOL) tablet 20 mg  20 mg Per Tube QHS Teressa Lower, NP   20 mg at 08/23/21 2054  ? sodium chloride flush (NS) 0.9 % injection 10-40 mL  10-40 mL Intracatheter Q12H Rust-Chester, Toribio Harbour  L, NP   10 mL at 08/24/21 1022  ? sodium chloride flush (NS) 0.9 % injection 10-40 mL  10-40 mL Intracatheter PRN Flora Lipps, MD      ? valproic acid (DEPAKENE) 250 MG/5ML solution 125 mg  125 mg Per Tube BID Dorothe Pea, RPH   125 mg at 08/24/21 1011  ? ? ? ?Discharge Medications: ?Please see discharge summary for a list of discharge medications. ? ?Relevant Imaging Results: ? ?Relevant Lab Results: ? ? ?Additional Information ?SS#: [id number]. Long-term resident at [facility]. ? ?Candie Chroman, LCSW ? ? ? ? ?

## 2021-08-24 NOTE — Progress Notes (Signed)
Nutrition Follow-up ? ?DOCUMENTATION CODES:  ? ?Not applicable ? ?INTERVENTION:  ? ?-Continue Glucerna 1.5 @ 50 ml/hr via g-tube  ? ?250 free water flush every 4 hours ? ?Tube feeding regimen provides 1800 kcal (100% of needs), 99 grams of protein, and 948 ml of H2O.  Total free water: 1448 ml daily ? ?-1 packet Juven BID via tube, each packet provides 95 calories, 2.5 grams of protein (collagen), and 9.8 grams of carbohydrate (3 grams sugar); also contains 7 grams of L-arginine and L-glutamine, 300 mg vitamin C, 15 mg vitamin E, 1.2 mcg vitamin B-12, 9.5 mg zinc, 200 mg calcium, and 1.5 g  Calcium Beta-hydroxy-Beta-methylbutyrate to support wound healing  ? ?NUTRITION DIAGNOSIS:  ? ?Inadequate oral intake related to dysphagia (pt with chronic G-tube) as evidenced by NPO status. ? ?Ongoing ? ?GOAL:  ? ?Patient will meet greater than or equal to 90% of their needs ? ?Met with TF ? ?MONITOR:  ? ?Labs, Weight trends, TF tolerance, Skin, I & O's ? ?REASON FOR ASSESSMENT:  ? ?Consult ?Enteral/tube feeding initiation and management ? ?ASSESSMENT:  ? ?66 y/o female with h/o COPD, DM, esophageal dysphagia with chronic G-tube, CVA, HTN, MDD, dementia, bedbound and lives at [facility] who is admitted with aspiration PNA. ? ?3/18- g-tube dislodged ?3/20- g-tube replaced by IR ?3/22- PICC placed ? ?Reviewed I/O's: +1.8 L x 24 hours and +13.3 L since admission ? ?UOP: 1.8 L x 24 hours ? ?Rectal tube output: 100 ml x 24 hours ? ?Pt NPO and dependent on g-tube for nutrition. Pt tolerating TF well. Free water flushes increased by MD secondary to hypernatremia.  ?  ?Medications reviewed and include lasix ? ?Per TOC notes, pt awaiting alternate SNF placement.  ? ?Labs reviewed: K: 3.2, CBGS: 93-124 (inpatient orders for glycemic control are 3-9 units insulin aspart every 4 hours).   ? ?Diet Order:   ?Diet Order   ? ?       ?  Diet NPO time specified  Diet effective now       ?  ? ?  ?  ? ?  ? ? ?EDUCATION NEEDS:  ? ?No  education needs have been identified at this time ? ?Skin:  Skin Assessment: Reviewed RN Assessment ? ?Last BM:  08/24/21 (200 ml via rectal tube) ? ?Height:  ? ?Ht Readings from Last 1 Encounters:  ?08/11/21 '5\' 6"'  (1.676 m)  ? ? ?Weight:  ? ?Wt Readings from Last 1 Encounters:  ?08/24/21 75.2 kg  ? ? ?Ideal Body Weight:  59 kg ? ?BMI:  Body mass index is 26.76 kg/m?. ? ?Estimated Nutritional Needs:  ? ?Kcal:  1700-1900kcal/day ? ?Protein:  85-95g/day ? ?Fluid:  1.8-2.1L/day ? ? ? ?Loistine Chance, RD, LDN, CDCES ?Registered Dietitian II ?Certified Diabetes Care and Education Specialist ?Please refer to [facility] for RD and/or RD on-call/weekend/after hours pager  ?

## 2021-08-24 NOTE — Assessment & Plan Note (Signed)
Patient with mucous plugging in right mainstem with right upper lobe collapse, intubated in ED.  Respiratory cultures positive for MSSA.  Received cefepime, vancomycin, followed by Unasyn and then completed a course with Augmentin ?Extubated on 08/12/2021 to heated high flow-now weaned to room air. ?-Continue with bronchodilators ?-Continue with chest PT ?

## 2021-08-24 NOTE — TOC Transition Note (Signed)
Transition of Care (TOC) - CM/SW Discharge Note ? ? ?Patient Details  ?Name: Kaitlyn Ruiz ?MRN: [id number] ?Date of Birth: 19-Nov-1955 ? ?Transition of Care (TOC) CM/SW Contact:  ?Margarito Liner, LCSW ?Phone Number: ?08/24/2021, 4:50 PM ? ? ?Clinical Narrative:  Patient has orders to discharge to [facility] SNF. RN is calling report now. EMS transport has been arranged and she is 3rd on the list. No further concerns. CSW signing off.  ? ?Final next level of care: Skilled Nursing Facility ?Barriers to Discharge: Barriers Resolved ? ? ?Patient Goals and CMS Choice ?Patient states their goals for this hospitalization and ongoing recovery are:: plan to return to [facility] ?  ?  ? ?Discharge Placement ?  ?Existing PASRR number confirmed : 08/24/21          ?Patient chooses bed at: Other - please specify in the comment section below: ([facility]) ?Patient to be transferred to facility by: EMS ?Name of family member notified: [name] Benbow ?Patient and family notified of of transfer: 08/24/21 ? ?Discharge Plan and Services ?  ?Discharge Planning Services: CM Consult ?Post Acute Care Choice: Resumption of Svcs/PTA Provider, Skilled Nursing Facility          ?DME Arranged: N/A ?DME Agency: NA ?  ?  ?  ?HH Arranged: NA ?  ?  ?  ?  ? ?Social Determinants of Health (SDOH) Interventions ?  ? ? ?Readmission Risk Interventions ? ?  08/14/2021  ? 10:11 AM  ?Readmission Risk Prevention Plan  ?Transportation Screening Complete  ?PCP or Specialist Appt within 3-5 Days Complete  ?HRI or Home Care Consult Complete  ?Social Work Consult for Recovery Care Planning/Counseling Complete  ?Medication Review Oceanographer) Complete  ? ? ? ? ? ?

## 2021-08-24 NOTE — Progress Notes (Signed)
?Progress Note ? ? ?Patient: Kaitlyn Ruiz [id number] DOB: 1955-08-20 DOA: 08/11/2021     13 ?DOS: the patient was seen and examined on 08/24/2021 ?  ?Brief hospital course: ?ICU transfer. ? ?Taken from prior notes. ? ?[name], 66 year old female with past medical history significant for extensive CVA with left-sided deficit, nonverbal and bedbound, PEG tube, COPD, type 2 diabetes and hypertension presented to [facility] ER on 03/14 from [facility] via EMS with acute respiratory distress GCS of 6 and active bleeding in the oropharynx.  ED provider proceeded with mechanical intubation.  She was found to be febrile at 101.1 and tachycardic. ?Labs on admission pertinent for leukocytosis at 22.2, lactic acidosis at 3.8, elevated troponin at 214, mildly elevated liver enzymes.  CT chest with endobronchial material, concerning for aspiration. ?Admitted with severe sepsis secondary to MSSA pneumonia under PCCM service in ICU. ?Patient was initially started on cefepime and vancomycin, later transitioned to Unasyn and now on Augmentin. ? ?Her course of hospitalization was complicated by mucous plugging, persistent hypotension and PEG tube dislodgment. ?She was extubated on 08/12/2021, initially on heated high flow, now transition back to room air. ? ?Her PEG tube was replaced by IR on 08/17/2021. ? ?Patient remained on pressors until 08/21/2021, currently stable on midodrine and Florinef. ? ?Please see PCCM note for complete list of significant events and management. ? ?TRH to resume care on 08/22/2021. ? ?3/25: Patient appears quite confused when seen during morning rounds.  She was just making some eye contact when calling her name, not following any other commands.  Per nursing staff she was little more alert before and able to follow some commands. ?Nursing concern of watery diarrhea and leukocytosis.  No abdominal pain or tenderness, will ask ID regarding testing for C. difficile as she will remain high  risk. ? ?3/26: Patient little more alert and following commands today.  She was able to speak couple of words, significant dysarthria.  Having some upper respiratory secretions and few basal crackles, ordered one-time dose of IV Lasix 20 mg. ?Also decreased free water intake to 250 mL every 4 hourly as hypernatremia has been resolved. ?Family wants a different SNF on discharge. ? ?3/27: Patient currently stable, saturating 100% on 2 L of oxygen which is her baseline.  Transfer to progressive care unit.  TOC is looking for a different SNF. ? ?Palliative care was consulted to discuss goals of care as she is full code.  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 ? ? ?Assessment and Plan: ?* Acute respiratory failure ([place]) ?Patient with mucous plugging in right mainstem with right upper lobe collapse, intubated in ED.  Respiratory cultures positive for MSSA.  Received cefepime, vancomycin, followed by Unasyn and then completed a course with Augmentin ?Extubated on 08/12/2021 to heated high flow-now weaned to room air. ?-Continue with bronchodilators ?-Continue with chest PT ? ?Septic shock ([place]) ?Patient initially admitted with severe sepsis and septic shock requiring pressors for many days.  Secondary to MSSA pneumonia.  Urine and blood cultures were negative. ?Currently blood pressure within goal with midodrine and Florinef. ?-Continue midodrine and Florinef ?-Continue to monitor ? ?MSSA (methicillin susceptible Staphylococcus aureus) pneumonia ([place]) ?Respiratory cultures positive for MSSA.  Patient initially received cefepime and vancomycin, followed by Unasyn and then Augmentin and completed the course. ?-Continue with supportive care ? ?Malfunction of percutaneous endoscopic gastrostomy (PEG) tube ([place]) ?S/p replacement by IR on 08/17/2021.  Currently functioning well. ?-Continue with current tube feed ? ?Hypokalemia ?Resolved, magnesium also within  normal limit. ?-Replete potassium as needed and  monitor ? ?Hypernatremia ?Sodium improved to 142 this morning after increasing free water intake. ?-Decrease free water intake to 250 mg every 4 hourly as she sounds little wet. ?-Monitor sodium ? ?COPD with chronic bronchitis ([place]) ?Patient admitted with COPD exacerbation secondary to pneumonia. ?Has been resolved.  Now on room air. ?-Continue with as needed bronchodilators ? ?HTN (hypertension) ?Blood pressure currently on softer side and patient is on midodrine and Florinef. ?No antihypertensives needed. ?-Continue to monitor ? ?Non-insulin dependent type 2 diabetes mellitus ([place]) ?CBG within goal. ?-Continue with SSI ? ?Diarrhea ?Patient with watery diarrhea and worsening leukocytosis.  She is unable to explain whether there is any abdominal pain but no tenderness noted. ?Patient was also started on Florinef on 08/19/2021 which can be contributory to leukocytosis. ?C. difficile was negative. ?-Continue to monitor ? ?(HFpEF) heart failure with preserved ejection fraction ([place]) ?Patient has an history of chronic HFpEF.  Few basal crackles today. ?Mildly elevated troponin on admission, most likely secondary to demand ischemia with acute respiratory failure and sepsis. ?Echocardiogram done on 08/12/2021 with normal EF and grade 1 diastolic dysfunction.  Right ventricular systolic function low normal.. ?-Give her one-time dose of IV Lasix 20 mg ?-Continue to monitor ? ?History of CVA (cerebrovascular accident) ?Patient has prior history of CVA with dysarthria, aphasia, and dysphagia along with left-sided residual deficit.  Patient is bedbound at baseline and lives at a skilled nursing facility.  No new deficit. ?-Continue with aspirin and statin ? ? ?  ? ?Subjective: Patient was more alert when seen today.  Denies any pain. ? ?Physical Exam: ?Vitals:  ? 08/24/21 0405 08/24/21 0500 08/24/21 0844 08/24/21 1138  ?BP: 123/60  121/73 106/72  ?Pulse:   94 78  ?Resp: 20  18 (!) 22  ?Temp: 97.6 ?F (36.4 ?C)  98 ?F (36.7 ?C)  97.6 ?F (36.4 ?C)  ?TempSrc: Oral     ?SpO2: 100%  98% 100%  ?Weight:  75.2 kg    ?Height:      ? ?General.  Chronically ill-appearing lady, in no acute distress. ?Pulmonary.  Lungs clear bilaterally, normal respiratory effort. ?CV.  Regular rate and rhythm, no JVD, rub or murmur. ?Abdomen.  Soft, nontender, nondistended, BS positive. ?CNS.  Alert and following commands, left hemiparesis. ?Extremities.  No edema, no cyanosis, pulses intact and symmetrical. ?Psychiatry.  Judgment and insight appears impaired. ? ?Data Reviewed: ?Prior notes and labs reviewed. ? ?Family Communication: Discussed with sister on phone. ? ?Disposition: ?Status is: Inpatient ?Remains inpatient appropriate because: Severity of illness. ? ? Planned Discharge Destination: Skilled nursing facility ? ?DVT prophylaxis.  Lovenox ? ?Time spent: 42 minutes ? ?This record has been created using Systems analyst. Errors have been sought and corrected,but may not always be located. Such creation errors do not reflect on the standard of care. ? ?Author: ?Lorella Nimrod, MD ?08/24/2021 2:44 PM ? ?For on call review [email or web address].  ?

## 2021-08-24 NOTE — Assessment & Plan Note (Signed)
Respiratory cultures positive for MSSA.  Patient initially received cefepime and vancomycin, followed by Unasyn and then Augmentin and completed the course. ?-Continue with supportive care ?

## 2021-08-27 ENCOUNTER — Emergency Department: Payer: Medicare Other

## 2021-08-27 ENCOUNTER — Inpatient Hospital Stay
Admission: EM | Admit: 2021-08-27 | Discharge: 2021-09-02 | DRG: 871 | Disposition: A | Discharge status: Skilled Nursing Facility | Payer: Medicare Other | Source: Skilled Nursing Facility | Attending: Internal Medicine | Admitting: Internal Medicine

## 2021-08-27 DIAGNOSIS — I110 Hypertensive heart disease with heart failure: Secondary | ICD-10-CM | POA: Diagnosis present

## 2021-08-27 DIAGNOSIS — E119 Type 2 diabetes mellitus without complications: Secondary | ICD-10-CM | POA: Diagnosis present

## 2021-08-27 DIAGNOSIS — Z9289 Personal history of other medical treatment: Secondary | ICD-10-CM

## 2021-08-27 DIAGNOSIS — J440 Chronic obstructive pulmonary disease with acute lower respiratory infection: Secondary | ICD-10-CM | POA: Diagnosis present

## 2021-08-27 DIAGNOSIS — Z515 Encounter for palliative care: Secondary | ICD-10-CM | POA: Diagnosis not present

## 2021-08-27 DIAGNOSIS — I9589 Other hypotension: Secondary | ICD-10-CM | POA: Diagnosis not present

## 2021-08-27 DIAGNOSIS — F329 Major depressive disorder, single episode, unspecified: Secondary | ICD-10-CM | POA: Diagnosis present

## 2021-08-27 DIAGNOSIS — I69391 Dysphagia following cerebral infarction: Secondary | ICD-10-CM

## 2021-08-27 DIAGNOSIS — J9600 Acute respiratory failure, unspecified whether with hypoxia or hypercapnia: Secondary | ICD-10-CM | POA: Diagnosis not present

## 2021-08-27 DIAGNOSIS — Z7401 Bed confinement status: Secondary | ICD-10-CM | POA: Diagnosis not present

## 2021-08-27 DIAGNOSIS — I69320 Aphasia following cerebral infarction: Secondary | ICD-10-CM | POA: Diagnosis not present

## 2021-08-27 DIAGNOSIS — Z931 Gastrostomy status: Secondary | ICD-10-CM

## 2021-08-27 DIAGNOSIS — Z79899 Other long term (current) drug therapy: Secondary | ICD-10-CM

## 2021-08-27 DIAGNOSIS — J9601 Acute respiratory failure with hypoxia: Secondary | ICD-10-CM | POA: Diagnosis not present

## 2021-08-27 DIAGNOSIS — I5030 Unspecified diastolic (congestive) heart failure: Secondary | ICD-10-CM | POA: Diagnosis present

## 2021-08-27 DIAGNOSIS — Z7982 Long term (current) use of aspirin: Secondary | ICD-10-CM | POA: Diagnosis not present

## 2021-08-27 DIAGNOSIS — A419 Sepsis, unspecified organism: Principal | ICD-10-CM | POA: Diagnosis present

## 2021-08-27 DIAGNOSIS — J189 Pneumonia, unspecified organism: Secondary | ICD-10-CM

## 2021-08-27 DIAGNOSIS — I5032 Chronic diastolic (congestive) heart failure: Secondary | ICD-10-CM | POA: Diagnosis not present

## 2021-08-27 DIAGNOSIS — J9602 Acute respiratory failure with hypercapnia: Secondary | ICD-10-CM | POA: Diagnosis not present

## 2021-08-27 DIAGNOSIS — Z7189 Other specified counseling: Secondary | ICD-10-CM | POA: Diagnosis not present

## 2021-08-27 DIAGNOSIS — Z87891 Personal history of nicotine dependence: Secondary | ICD-10-CM

## 2021-08-27 DIAGNOSIS — I69322 Dysarthria following cerebral infarction: Secondary | ICD-10-CM | POA: Diagnosis not present

## 2021-08-27 DIAGNOSIS — Y95 Nosocomial condition: Secondary | ICD-10-CM | POA: Diagnosis present

## 2021-08-27 DIAGNOSIS — Z7951 Long term (current) use of inhaled steroids: Secondary | ICD-10-CM

## 2021-08-27 DIAGNOSIS — Z20822 Contact with and (suspected) exposure to covid-19: Secondary | ICD-10-CM | POA: Diagnosis present

## 2021-08-27 DIAGNOSIS — Z794 Long term (current) use of insulin: Secondary | ICD-10-CM

## 2021-08-27 DIAGNOSIS — Z8673 Personal history of transient ischemic attack (TIA), and cerebral infarction without residual deficits: Secondary | ICD-10-CM

## 2021-08-27 DIAGNOSIS — R6520 Severe sepsis without septic shock: Secondary | ICD-10-CM | POA: Diagnosis not present

## 2021-08-27 LAB — URINALYSIS, COMPLETE (UACMP) WITH MICROSCOPIC
Bilirubin Urine: NEGATIVE
Glucose, UA: NEGATIVE mg/dL
Ketones, ur: NEGATIVE mg/dL
Nitrite: NEGATIVE
Protein, ur: NEGATIVE mg/dL
Specific Gravity, Urine: 1.012 (ref 1.005–1.030)
pH: 9 — ABNORMAL HIGH (ref 5.0–8.0)

## 2021-08-27 LAB — CBC WITH DIFFERENTIAL/PLATELET
Abs Immature Granulocytes: 0.05 10*3/uL (ref 0.00–0.07)
Basophils Absolute: 0 10*3/uL (ref 0.0–0.1)
Basophils Relative: 0 %
Eosinophils Absolute: 0.2 10*3/uL (ref 0.0–0.5)
Eosinophils Relative: 2 %
HCT: 34.1 % — ABNORMAL LOW (ref 36.0–46.0)
Hemoglobin: 11.1 g/dL — ABNORMAL LOW (ref 12.0–15.0)
Immature Granulocytes: 0 %
Lymphocytes Relative: 24 %
Lymphs Abs: 2.8 10*3/uL (ref 0.7–4.0)
MCH: 31 pg (ref 26.0–34.0)
MCHC: 32.6 g/dL (ref 30.0–36.0)
MCV: 95.3 fL (ref 80.0–100.0)
Monocytes Absolute: 0.8 10*3/uL (ref 0.1–1.0)
Monocytes Relative: 7 %
Neutro Abs: 7.9 10*3/uL — ABNORMAL HIGH (ref 1.7–7.7)
Neutrophils Relative %: 67 %
Platelets: 133 10*3/uL — ABNORMAL LOW (ref 150–400)
RBC: 3.58 MIL/uL — ABNORMAL LOW (ref 3.87–5.11)
RDW: 15.3 % (ref 11.5–15.5)
WBC: 11.7 10*3/uL — ABNORMAL HIGH (ref 4.0–10.5)
nRBC: 0 % (ref 0.0–0.2)

## 2021-08-27 LAB — BLOOD GAS, VENOUS
Acid-Base Excess: 5.1 mmol/L — ABNORMAL HIGH (ref 0.0–2.0)
Bicarbonate: 30.5 mmol/L — ABNORMAL HIGH (ref 20.0–28.0)
FIO2: 0.32 %
O2 Saturation: 62.1 %
Patient temperature: 37
pCO2, Ven: 47 mmHg (ref 44–60)
pH, Ven: 7.42 (ref 7.25–7.43)
pO2, Ven: 36 mmHg (ref 32–45)

## 2021-08-27 LAB — APTT: aPTT: 28 seconds (ref 24–36)

## 2021-08-27 LAB — COMPREHENSIVE METABOLIC PANEL
ALT: 37 U/L (ref 0–44)
AST: 57 U/L — ABNORMAL HIGH (ref 15–41)
Albumin: 2.6 g/dL — ABNORMAL LOW (ref 3.5–5.0)
Alkaline Phosphatase: 119 U/L (ref 38–126)
Anion gap: 7 (ref 5–15)
BUN: 13 mg/dL (ref 8–23)
CO2: 27 mmol/L (ref 22–32)
Calcium: 8.5 mg/dL — ABNORMAL LOW (ref 8.9–10.3)
Chloride: 98 mmol/L (ref 98–111)
Creatinine, Ser: 0.46 mg/dL (ref 0.44–1.00)
GFR, Estimated: >60 mL/min (ref >60)
Glucose, Bld: 120 mg/dL — ABNORMAL HIGH (ref 70–99)
Potassium: 3.7 mmol/L (ref 3.5–5.1)
Sodium: 132 mmol/L — ABNORMAL LOW (ref 135–145)
Total Bilirubin: 0.7 mg/dL (ref 0.3–1.2)
Total Protein: 7.1 g/dL (ref 6.5–8.1)

## 2021-08-27 LAB — RESP PANEL BY RT-PCR (FLU A&B, COVID) ARPGX2
Influenza A by PCR: NEGATIVE
Influenza B by PCR: NEGATIVE
SARS Coronavirus 2 by RT PCR: NEGATIVE

## 2021-08-27 LAB — PROTIME-INR
INR: 1 (ref 0.8–1.2)
Prothrombin Time: 13.3 seconds (ref 11.4–15.2)

## 2021-08-27 LAB — LACTIC ACID, PLASMA
Lactic Acid, Venous: 1.6 mmol/L (ref 0.5–1.9)
Lactic Acid, Venous: 2.3 mmol/L (ref 0.5–1.9)

## 2021-08-27 LAB — PROCALCITONIN: Procalcitonin: 0.1 ng/mL

## 2021-08-27 LAB — BRAIN NATRIURETIC PEPTIDE: B Natriuretic Peptide: 201.6 pg/mL — ABNORMAL HIGH (ref 0.0–100.0)

## 2021-08-27 MED ORDER — VANCOMYCIN HCL 1750 MG/350ML IV SOLN
1750.0000 mg | Freq: Once | INTRAVENOUS | Status: AC
Start: 2021-08-27 — End: 2021-08-28
  Administered 2021-08-27: 1750 mg via INTRAVENOUS
  Filled 2021-08-27: qty 350

## 2021-08-27 MED ORDER — LACTATED RINGERS IV SOLN: INTRAVENOUS | Status: DC

## 2021-08-27 MED ORDER — IOHEXOL 350 MG/ML SOLN
75.0000 mL | Freq: Once | INTRAVENOUS | Status: AC | PRN
  Administered 2021-08-27: 75 mL via INTRAVENOUS

## 2021-08-27 MED ORDER — SODIUM CHLORIDE 0.9 % IV SOLN
2.0000 g | Freq: Once | INTRAVENOUS | Status: AC
  Administered 2021-08-27: 2 g via INTRAVENOUS
  Filled 2021-08-27: qty 2

## 2021-08-27 MED ORDER — LACTATED RINGERS IV BOLUS
1000.0000 mL | Freq: Once | INTRAVENOUS | Status: AC
Start: 2021-08-27 — End: 2021-08-28
  Administered 2021-08-27: 1000 mL via INTRAVENOUS

## 2021-08-27 MED ORDER — ACETAMINOPHEN 160 MG/5ML PO SOLN
650.0000 mg | Freq: Three times a day (TID) | ORAL | Status: DC | PRN
  Administered 2021-08-27: 650 mg
  Filled 2021-08-27 (×3): qty 20.3

## 2021-08-27 NOTE — Assessment & Plan Note (Addendum)
Secondary to dysphagia from prior stroke ?--cont tube feed ?--monitor for aspiration events ?--palliative care consult today ?

## 2021-08-27 NOTE — ED Provider Notes (Signed)
? ?[facility] ?Provider Note ? ? ? Event Date/Time  ? First MD Initiated Contact with Patient 08/27/21 1925   ?  (approximate) ? ? ?History  ? ?Respiratory Distress ? ? ?HPI ? ?Kaitlyn Ruiz is a 66 y.o. female  past medical history significant for extensive CVA with left-sided deficit, nonverbal and bedbound, PEG tube, COPD, DM and HTN as well as recent hospitalization 3/14 to 3/27 for respiratory failure requiring intubation and septic shock thought to be secondary to aspiration presents via EMS from nursing facility for acute onset of shortness of breath.  Patient is unable to obtain history on arrival secondary to being nonverbal.  Per report from EMS patient does not normally wear any oxygen and was requiring 3 L on arrival due to SPO2 in the high 80s.  She was reportedly eating earlier today per EMS although does get most of her nutrition through PEG tube.  No other history is available on patient presentation. ?  ? ? ?Physical Exam  ?Triage Vital Signs: ?ED Triage Vitals  ?Enc Vitals Group  ?   BP 08/27/21 1926 130/78  ?   Pulse Rate 08/27/21 1926 99  ?   Resp 08/27/21 1926 (!) 28  ?   Temp 08/27/21 1926 100.1 ?F (37.8 ?C)  ?   Temp Source 08/27/21 1926 Oral  ?   SpO2 08/27/21 1925 100 %  ?   Weight --   ?   Height --   ?   Head Circumference --   ?   Peak Flow --   ?   Pain Score --   ?   Pain Loc --   ?   Pain Edu? --   ?   Excl. in [place]? --   ? ? ?Most recent vital signs: ?Vitals:  ? 08/27/21 2115 08/27/21 2145  ?BP: (!) 137/91 (!) 151/76  ?Pulse: 100 (!) 109  ?Resp: (!) 30 (!) 27  ?Temp:    ?SpO2: 100% 100%  ? ? ?General: Awake, chronically ill-appearing. ?CV:  No significant murmur.  2+ radial pulse. ?Resp:  Bilateral rhonchi.  Tachypnea. ?Abd:  No distention.  Soft.  PEG tube in place. ?Other:   ? ? ?ED Results / Procedures / Treatments  ?Labs ?(all labs ordered are listed, but only abnormal results are displayed) ?Labs Reviewed  ?BLOOD GAS, VENOUS - Abnormal; Notable for the  following components:  ?    Result Value  ? Bicarbonate 30.5 (*)   ? Acid-Base Excess 5.1 (*)   ? All other components within normal limits  ?LACTIC ACID, PLASMA - Abnormal; Notable for the following components:  ? Lactic Acid, Venous 2.3 (*)   ? All other components within normal limits  ?COMPREHENSIVE METABOLIC PANEL - Abnormal; Notable for the following components:  ? Sodium 132 (*)   ? Glucose, Bld 120 (*)   ? Calcium 8.5 (*)   ? Albumin 2.6 (*)   ? AST 57 (*)   ? All other components within normal limits  ?CBC WITH DIFFERENTIAL/PLATELET - Abnormal; Notable for the following components:  ? WBC 11.7 (*)   ? RBC 3.58 (*)   ? Hemoglobin 11.1 (*)   ? HCT 34.1 (*)   ? Platelets 133 (*)   ? Neutro Abs 7.9 (*)   ? All other components within normal limits  ?URINALYSIS, COMPLETE (UACMP) WITH MICROSCOPIC - Abnormal; Notable for the following components:  ? Color, Urine YELLOW (*)   ? APPearance CLOUDY (*)   ?  pH 9.0 (*)   ? Hgb urine dipstick SMALL (*)   ? Leukocytes,Ua TRACE (*)   ? Bacteria, UA RARE (*)   ? All other components within normal limits  ?BRAIN NATRIURETIC PEPTIDE - Abnormal; Notable for the following components:  ? B Natriuretic Peptide 201.6 (*)   ? All other components within normal limits  ?RESP PANEL BY RT-PCR (FLU A&B, COVID) [id number]  ?CULTURE, BLOOD (ROUTINE X 2)  ?CULTURE, BLOOD (ROUTINE X 2)  ?LACTIC ACID, PLASMA  ?PROTIME-INR  ?APTT  ?PROCALCITONIN  ? ? ? ?EKG ? ?ECG is remarkable for sinus rhythm with a ventricular rate of 93, unremarkable intervals, and some artifact in lead II and lead III and aVF without clear evidence of acute ischemia or significant arrhythmia. ? ? ?RADIOLOGY ? ?Chest x-ray my interpretation shows cardiomegaly and some bilateral patchy infiltrates more significant on the left.  There is no large effusion, pneumothorax or other clear acute process.  I reviewed radiologist rotation and agree with findings representing possible edema versus pneumonia with improved aeration  compared to chest x-ray from 3/19. ? ?CTA chest my interpretation without evidence of PE but does show bilateral infiltrates concerning for multifocal pneumonia.  There is no pneumothorax.  I also reviewed radiologist interpretation and agree with their findings of some mediastinal adenopathy and small pericardial effusion as well as moderate bilateral pleural effusions without other any other clear acute process. ? ? ?PROCEDURES: ? ?Critical Care performed: Yes, see critical care procedure note(s) ? ?.Critical Care ?Performed by: Lucrezia Starch, MD ?Authorized by: Lucrezia Starch, MD  ? ?Critical care provider statement:  ?  Critical care time (minutes):  30 ?  Critical care was necessary to treat or prevent imminent or life-threatening deterioration of the following conditions:  Sepsis ?  Critical care was time spent personally by me on the following activities:  Development of treatment plan with patient or surrogate, discussions with consultants, evaluation of patient's response to treatment, examination of patient, ordering and review of laboratory studies, ordering and review of radiographic studies, ordering and performing treatments and interventions, pulse oximetry, re-evaluation of patient's condition and review of old charts ? ? ? ?MEDICATIONS ORDERED IN ED: ?Medications  ?acetaminophen (TYLENOL) 160 MG/5ML solution 650 mg (650 mg Per Tube Given 08/27/21 2215)  ?vancomycin (VANCOREADY) IVPB 1750 mg/350 mL (has no administration in time range)  ?iohexol (OMNIPAQUE) 350 MG/ML injection 75 mL (75 mLs Intravenous Contrast Given 08/27/21 2130)  ?lactated ringers bolus 1,000 mL (1,000 mLs Intravenous New Bag/Given 08/27/21 2206)  ?ceFEPIme (MAXIPIME) 2 g in sodium chloride 0.9 % 100 mL IVPB (2 g Intravenous New Bag/Given 08/27/21 2215)  ? ? ? ?IMPRESSION / MDM / ASSESSMENT AND PLAN / ED COURSE  ?I reviewed the triage vital signs and the nursing notes. ?             ?               ? ?Differential diagnosis  includes, but is not limited to pneumonia, CHF, PE, anemia, metabolic derangements, bronchitis such as from acute viral process i.e. COVID or influenza, anemia and metabolic derangements.  Patient seems to be at her neurological baseline on arrival.  No obvious trauma or other source of infection. ? ?ECG is remarkable for sinus rhythm with a ventricular rate of 93, unremarkable intervals, and some artifact in lead II and lead III and aVF without clear evidence of acute ischemia or significant arrhythmia. ? ?Chest x-ray my interpretation shows cardiomegaly  and some bilateral patchy infiltrates more significant on the left.  There is no large effusion, pneumothorax or other clear acute process.  I reviewed radiologist rotation and agree with findings representing possible edema versus pneumonia with improved aeration compared to chest x-ray from 3/19. ? ?CTA chest my interpretation without evidence of PE but does show bilateral infiltrates concerning for multifocal pneumonia.  There is no pneumothorax.  I also reviewed radiologist interpretation and agree with their findings of some mediastinal adenopathy and small pericardial effusion as well as moderate bilateral pleural effusions without other any other clear acute process. ? ?CMP shows no significant electrolyte or metabolic derangements.  Albumin is low at 2.6 and AST is slightly elevated at 57.  Lactic acid is 1.6.  COVID influenza PCR is negative.  CBC shows WBC count of 11.7 compared to 18.93 days ago without evidence of acute anemia and platelets of 133.  PT and PTT are unremarkable.  Procalcitonin 0.1.  VBG with a pH of 7.42 and a PCO2 of 47 and bicarb of 30.5 not suggestive of acute hypercarbic respiratory failure.  UA has only rare bacteria and trace leukocyte esterase but otherwise does not suggestive of cystitis. ? ?On reassessment patient is still tachypneic as she was on arrival in the mid 20s to low 30s and given she is febrile at one 1.7 and  tachycardic and concern for sepsis from pneumonia possibly aspiration related given her acute event reported earlier today associate with hypoxia although we are able to wean patient to room air.  Broad-spectrum antibiotics

## 2021-08-27 NOTE — Assessment & Plan Note (Addendum)
--  temp 101.7, tachycardia, tachypnea, source PNA ?

## 2021-08-27 NOTE — Progress Notes (Signed)
CODE SEPSIS - PHARMACY COMMUNICATION ? ?**Broad Spectrum Antibiotics should be administered within 1 hour of Sepsis diagnosis** ? ?Time Code Sepsis Called/Page Received:  3/30 @ 2248  ? ?Antibiotics Ordered: Cefepime  ? ?Time of 1st antibiotic administration: Cefepime 2 gm IV X 1 on 3/30 @ 2215  ? ?Additional action taken by pharmacy:  ? ?If necessary, Name of Provider/Nurse Contacted:  ? ? ? ?Jaree Dwight D ,PharmD ?Clinical Pharmacist  ?08/27/2021  11:01 PM ? ?

## 2021-08-27 NOTE — Assessment & Plan Note (Addendum)
Total care dependent.  Resides in nursing facility ?

## 2021-08-27 NOTE — Assessment & Plan Note (Addendum)
Blood pressure stable.  Patient chronically on midodrine and Florinef ?--d/c'ed midodrine since BP has been 130's-140's ?--cont taper, currently Florinef to 0.05 mg daily ?

## 2021-08-27 NOTE — ED Triage Notes (Signed)
Pt from [facility]. Pt had respiratory distress onset approx 20 minutes PTA. Pt on room air at baseline, 3L on arrival. Pt has rales per EMS and has hx of mucus plugs. Pt abdominal breathing on arrival, 28 RR ?

## 2021-08-27 NOTE — Progress Notes (Signed)
PHARMACY -  BRIEF ANTIBIOTIC NOTE  ? ?Pharmacy has received consult(s) for Vanc, Cefepime from an ED provider.  The patient's profile has been reviewed for ht/wt/allergies/indication/available labs.   ? ?One time order(s) placed for Vancomycin 1750 mg IV X 1 and Cefepime 2 gm IV X 1.  ? ?Further antibiotics/pharmacy consults should be ordered by admitting physician if indicated.       ?                ?Thank you, ?Ednah Hammock D ?08/27/2021  10:05 PM ? ?

## 2021-08-27 NOTE — Assessment & Plan Note (Signed)
As above.
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
## 2021-08-27 NOTE — Sepsis Progress Note (Signed)
Following for sepsis monitoring ?

## 2021-08-27 NOTE — Assessment & Plan Note (Addendum)
Monitor blood sugars.  Continue sliding scale insulin coverage ?

## 2021-08-27 NOTE — Assessment & Plan Note (Addendum)
--  due to recent septic shock from MSSA pneumonia and septic presentation during current admission, pt was started on treatment for HCAP with vanc and cefepime ?--CTA chest showed "Interval development of asymmetric biapical pulmonary infiltrates". ?--completed vanc and cefepime for 5 day course ?--hold further abx, since pt has had 2 courses of vanc/cefepime within a month, and her hypoxic events are due to aspiration ?

## 2021-08-27 NOTE — Assessment & Plan Note (Addendum)
Per report from EMS patient does not normally wear any oxygen and was requiring 3 L on arrival due to SPO2 in the high 80s.  Pt was reportedly eating earlier today per EMS.  High likelihood of aspiration.  CTA chest showed Interval development of asymmetric biapical pulmonary infiltrates, so can not rule out PNA.  Pt also has known hx of mucus plugging.   ?--pt completed 5 days of vanc and cefepime. ?--During the hospitalization, pt had about 3 episodes of acute respiratory distress and hypoxic, the last one after finishing HCAP treatment.  All episodes resolved quickly and pt was able to be weaned back to room air quickly.  These episodes are mostly likely aspiration events, which almost certainly will recur.  ?Plan: ?--monitor for aspiration or excess secretion, and suction PRN ?--palliative consult today ?--hold further abx, since pt has had 2 courses of vanc/cefepime within a month, and her hypoxic events are due to aspiration ?

## 2021-08-27 NOTE — Assessment & Plan Note (Addendum)
Recent echo showed grade 1 diastolic dysfunction ?--cont IV lasix 40 mg BID for extra fluid removal ?

## 2021-08-27 NOTE — Assessment & Plan Note (Addendum)
-  cont ASA and statin

## 2021-08-27 NOTE — Progress Notes (Signed)
[facility] [employer], [facility]) Hospital Liaison note: ? ?Notified via Epic workque by Dr. Arnetha Courser of request for [facility] Palliative Care services.  ? ?Will continue to follow for disposition. ? ?Please call with any outpatient palliative questions or concerns. ? ?Thank you for the opportunity to participate in this patient's care. ? ?Thank you, ?Abran Cantor, LPN ?[facility] Hospital Liaison ?[phone] ?

## 2021-08-28 ENCOUNTER — Other Ambulatory Visit: Payer: Self-pay

## 2021-08-28 ENCOUNTER — Inpatient Hospital Stay: Payer: Medicare Other

## 2021-08-28 DIAGNOSIS — J9600 Acute respiratory failure, unspecified whether with hypoxia or hypercapnia: Secondary | ICD-10-CM | POA: Diagnosis not present

## 2021-08-28 DIAGNOSIS — A419 Sepsis, unspecified organism: Secondary | ICD-10-CM

## 2021-08-28 DIAGNOSIS — R6520 Severe sepsis without septic shock: Secondary | ICD-10-CM | POA: Diagnosis not present

## 2021-08-28 LAB — BASIC METABOLIC PANEL
Anion gap: 4 — ABNORMAL LOW (ref 5–15)
BUN: 8 mg/dL (ref 8–23)
CO2: 27 mmol/L (ref 22–32)
Calcium: 8.1 mg/dL — ABNORMAL LOW (ref 8.9–10.3)
Chloride: 104 mmol/L (ref 98–111)
Creatinine, Ser: <0.3 mg/dL — ABNORMAL LOW (ref 0.44–1.00)
Glucose, Bld: 85 mg/dL (ref 70–99)
Potassium: 3.9 mmol/L (ref 3.5–5.1)
Sodium: 135 mmol/L (ref 135–145)

## 2021-08-28 LAB — CBG MONITORING, ED
Glucose-Capillary: 94 mg/dL (ref 70–99)
Glucose-Capillary: 92 mg/dL (ref 70–99)
Glucose-Capillary: 75 mg/dL (ref 70–99)
Glucose-Capillary: 82 mg/dL (ref 70–99)

## 2021-08-28 LAB — CBC
HCT: 33 % — ABNORMAL LOW (ref 36.0–46.0)
Hemoglobin: 10.5 g/dL — ABNORMAL LOW (ref 12.0–15.0)
MCH: 31.1 pg (ref 26.0–34.0)
MCHC: 31.8 g/dL (ref 30.0–36.0)
MCV: 97.6 fL (ref 80.0–100.0)
Platelets: 111 10*3/uL — ABNORMAL LOW (ref 150–400)
RBC: 3.38 MIL/uL — ABNORMAL LOW (ref 3.87–5.11)
RDW: 15.7 % — ABNORMAL HIGH (ref 11.5–15.5)
WBC: 11.8 10*3/uL — ABNORMAL HIGH (ref 4.0–10.5)
nRBC: 0 % (ref 0.0–0.2)

## 2021-08-28 LAB — PROTIME-INR
INR: 1.1 (ref 0.8–1.2)
Prothrombin Time: 13.9 seconds (ref 11.4–15.2)

## 2021-08-28 LAB — PROCALCITONIN: Procalcitonin: <0.1 ng/mL

## 2021-08-28 LAB — GLUCOSE, CAPILLARY
Glucose-Capillary: 117 mg/dL — ABNORMAL HIGH (ref 70–99)
Glucose-Capillary: 75 mg/dL (ref 70–99)
Glucose-Capillary: 84 mg/dL (ref 70–99)
Glucose-Capillary: 95 mg/dL (ref 70–99)

## 2021-08-28 LAB — LACTIC ACID, PLASMA: Lactic Acid, Venous: 1.1 mmol/L (ref 0.5–1.9)

## 2021-08-28 LAB — CORTISOL-AM, BLOOD: Cortisol - AM: 12 ug/dL (ref 6.7–22.6)

## 2021-08-28 MED ORDER — INSULIN ASPART 100 UNIT/ML IJ SOLN: 0.0000 [IU] | INTRAMUSCULAR | Status: DC

## 2021-08-28 MED ORDER — ONDANSETRON HCL 4 MG PO TABS: 4.0000 mg | ORAL_TABLET | Freq: Four times a day (QID) | ORAL | Status: DC | PRN

## 2021-08-28 MED ORDER — GABAPENTIN 250 MG/5ML PO SOLN
300.0000 mg | Freq: Three times a day (TID) | ORAL | Status: DC
  Filled 2021-08-28: qty 6

## 2021-08-28 MED ORDER — GABAPENTIN 250 MG/5ML PO SOLN
300.0000 mg | Freq: Three times a day (TID) | ORAL | Status: DC
  Administered 2021-08-28 – 2021-09-02 (×15): 300 mg
  Filled 2021-08-28 (×19): qty 6

## 2021-08-28 MED ORDER — FLUDROCORTISONE ACETATE 0.1 MG PO TABS
0.1000 mg | ORAL_TABLET | Freq: Every day | ORAL | Status: DC
  Administered 2021-08-28 – 2021-08-30 (×3): 0.1 mg
  Filled 2021-08-28 (×4): qty 1

## 2021-08-28 MED ORDER — FREE WATER
180.0000 mL | Freq: Every day | Status: DC
  Filled 2021-08-28 (×3): qty 180

## 2021-08-28 MED ORDER — VALPROIC ACID 250 MG/5ML PO SOLN
125.0000 mg | Freq: Two times a day (BID) | ORAL | Status: DC
  Administered 2021-08-28 – 2021-09-02 (×11): 125 mg
  Filled 2021-08-28 (×11): qty 5

## 2021-08-28 MED ORDER — ONDANSETRON HCL 4 MG/2ML IJ SOLN: 4.0000 mg | Freq: Four times a day (QID) | INTRAMUSCULAR | Status: DC | PRN

## 2021-08-28 MED ORDER — BUDESONIDE 0.25 MG/2ML IN SUSP
0.2500 mg | Freq: Two times a day (BID) | RESPIRATORY_TRACT | Status: DC
  Administered 2021-08-28 – 2021-09-02 (×10): 0.25 mg via RESPIRATORY_TRACT
  Filled 2021-08-28 (×9): qty 2

## 2021-08-28 MED ORDER — MIDODRINE HCL 5 MG PO TABS
2.5000 mg | ORAL_TABLET | Freq: Three times a day (TID) | ORAL | Status: DC
  Administered 2021-08-28: 2.5 mg
  Filled 2021-08-28 (×2): qty 1

## 2021-08-28 MED ORDER — CITALOPRAM HYDROBROMIDE 10 MG PO TABS
10.0000 mg | ORAL_TABLET | Freq: Every day | ORAL | Status: DC
  Administered 2021-08-28 – 2021-09-02 (×6): 10 mg
  Filled 2021-08-28 (×6): qty 1

## 2021-08-28 MED ORDER — PROSOURCE TF PO LIQD
45.0000 mL | Freq: Every day | ORAL | Status: DC
  Administered 2021-08-28 – 2021-09-01 (×5): 45 mL
  Filled 2021-08-28 (×6): qty 45

## 2021-08-28 MED ORDER — LACTATED RINGERS IV SOLN: INTRAVENOUS | Status: AC

## 2021-08-28 MED ORDER — OSMOLITE 1.2 CAL PO LIQD: 1000.0000 mL | ORAL | Status: DC

## 2021-08-28 MED ORDER — SODIUM CHLORIDE 0.9 % IV SOLN
2.0000 g | Freq: Three times a day (TID) | INTRAVENOUS | Status: DC
  Administered 2021-08-28 – 2021-09-01 (×13): 2 g via INTRAVENOUS
  Filled 2021-08-28 (×15): qty 2

## 2021-08-28 MED ORDER — BUDESONIDE 0.25 MG/2ML IN SUSP
0.2500 mg | Freq: Two times a day (BID) | RESPIRATORY_TRACT | Status: DC
  Administered 2021-08-28: 0.25 mg via RESPIRATORY_TRACT
  Filled 2021-08-28 (×2): qty 2

## 2021-08-28 MED ORDER — ENOXAPARIN SODIUM 40 MG/0.4ML IJ SOSY
40.0000 mg | PREFILLED_SYRINGE | INTRAMUSCULAR | Status: DC
  Administered 2021-08-28 – 2021-09-02 (×6): 40 mg via SUBCUTANEOUS
  Filled 2021-08-28 (×6): qty 0.4

## 2021-08-28 MED ORDER — GLUCERNA 1.5 CAL PO LIQD
1000.0000 mL | ORAL | Status: DC
  Administered 2021-08-28 – 2021-08-31 (×4): 1000 mL

## 2021-08-28 MED ORDER — VANCOMYCIN HCL 1750 MG/350ML IV SOLN
1750.0000 mg | INTRAVENOUS | Status: DC
  Administered 2021-08-29: 1750 mg via INTRAVENOUS
  Filled 2021-08-28: qty 350

## 2021-08-28 MED ORDER — ASPIRIN 81 MG PO CHEW
81.0000 mg | CHEWABLE_TABLET | Freq: Every day | ORAL | Status: DC
  Administered 2021-08-28 – 2021-09-02 (×6): 81 mg
  Filled 2021-08-28 (×6): qty 1

## 2021-08-28 MED ORDER — FREE WATER
150.0000 mL | Freq: Four times a day (QID) | Status: DC
  Administered 2021-08-28 – 2021-09-02 (×17): 150 mL
  Filled 2021-08-28 (×2): qty 150

## 2021-08-28 MED ORDER — OSMOLITE 1.5 CAL PO LIQD: 237.0000 mL | Freq: Every day | ORAL | Status: DC

## 2021-08-28 MED ORDER — PRAVASTATIN SODIUM 20 MG PO TABS
20.0000 mg | ORAL_TABLET | Freq: Every day | ORAL | Status: DC
  Administered 2021-08-29 – 2021-09-01 (×5): 20 mg
  Filled 2021-08-28 (×5): qty 1

## 2021-08-28 NOTE — Progress Notes (Signed)
Admission profile udpated ?

## 2021-08-28 NOTE — Progress Notes (Signed)
Pt heard gurgling from hallway by Nurse Trinna Post.  Pt tachypneic with audible gurgling and white frothy mucous drooling from mouth.  IVFs stopped, Yankeurs suction done.  Oxygen saturations 88% on room air.  Oxygen at [facility] applied with sats 94%.  Rapid response called and NP Jon Billings made aware.  See new oders. ?
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
## 2021-08-28 NOTE — ED Notes (Signed)
Pt is asleep at this time on the monitor.   

## 2021-08-28 NOTE — Progress Notes (Signed)
Pharmacy Antibiotic Note ? ?Kaitlyn Ruiz is a 65 y.o. female admitted on 08/27/2021 with sepsis.  Pharmacy has been consulted for Vancomycin, Cefepime dosing. ? ?Plan: ?Cefepime 2 gm IV X 1 given in ED on 3/30 @ 2215. ?Cefepime 2 gm IV Q8H ordered to start on 3/31 @ 0600.  ? ?Vancomycin 1750 mg IV X 1 given in ED on 3/30 @ 2314. ?Vancomycin 1750 mg IV Q24H ordered to start on 3/31 @ 2300. ? ?AUC = 524.6 ?Vanc trough = 11.0 ? ?Height: 5\' 6"  (167.6 cm) ?Weight: 76.8 kg (169 lb 5 oz) ?IBW/kg (Calculated) : 59.3 ? ?Temp (24hrs), Avg:100.6 ?F (38.1 ?C), Min:99.9 ?F (37.7 ?C), Max:101.7 ?F (38.7 ?C) ? ?Recent Labs  ?Lab 08/21/21 ?08/23/21 08/21/21 ?1355 08/22/21 ?0450 08/23/21 ?0437 08/24/21 ?08/26/21 08/27/21 ?1931 08/27/21 ?2119  ?WBC 19.1*  --  21.5* 19.9* 18.9* 11.7*  --   ?CREATININE 0.68  --  0.53 0.56 0.41* 0.46  --   ?LATICACIDVEN  --  2.3*  --   --   --  1.6 2.3*  ?  ?Estimated Creatinine Clearance: 75.3 mL/min (by C-G formula based on SCr of 0.46 mg/dL).   ? ?No Known Allergies ? ?Antimicrobials this admission: ?  >>  ?  >>  ? ?Dose adjustments this admission: ? ? ?Microbiology results: ? BCx:  ? UCx:   ? Sputum:   ? MRSA PCR:  ? ?Thank you for allowing pharmacy to be a part of this patient?s care. ? ?Kenlynn Houde D ?08/28/2021 12:46 AM ? ?

## 2021-08-28 NOTE — Progress Notes (Signed)
Initial Nutrition Assessment ? ?DOCUMENTATION CODES:  ? ?Not applicable ? ?INTERVENTION:  ? ?Initiate Glucerna 1.5 @ 25 ml/hr via g-tube and increase by 10 ml every 4 hours to goal rate of 55 ml/hr.  ? ?45 ml Prosource TF daily ? ?150 ml free water flush every 6 hours ? ?Tube feeding regimen provides 2020 kcal (100% of needs), 120 grams of protein, and 1002 ml of H2O.  Total free water 1602 ml daily ? ?NUTRITION DIAGNOSIS:  ? ?Inadequate oral intake related to dysphagia as evidenced by NPO status. ? ?GOAL:  ? ?Patient will meet greater than or equal to 90% of their needs ? ?MONITOR:  ? ?Labs, Weight trends, TF tolerance, Skin, I & O's ? ?REASON FOR ASSESSMENT:  ? ?Consult ?Enteral/tube feeding initiation and management ? ?ASSESSMENT:  ? ?Kaitlyn Ruiz is a 66 y.o. female with medical history significant for CVA with residual left hemiparesis, aphasia and dysphagia requiring PEG tube and total care dependent, HFpEF (G1 DD 08/12/2021), T2DM, chronic hypotension on midodrine and Florinef, COPD, hospitalized from 3/14 to 08/24/2021 for septic shock and acute respiratory failure secondary to MSSA pneumonia requiring intubation, completing treatment with cefepime and Vanco followed by Unasyn and then Augmentin, receiving G-tube replacement on 3/20 due to malfunction, who presents to the ED from the skilled nursing facility where she resides, by EMS in acute respiratory distress of sudden onset about 20 minutes prior to arrival.  She arrived breathing at 28 breaths/min requiring 3 L O2.  Patient unable to contribute to history due to deficits. ? ?Pt admitted with sepsis and HCAP.  ? ?Pt unavailable at time of visit. RD unable to obtain further nutrition-related history or complete nutrition-focused physical exam at this time.   ?  ?Pt familiar to this RD due to recent prior admission. She is a resident at [facility]). She I chronically NPO secondary to dysphagia and dependent on g-tube feeds. Home regimen is  Glucerna 1.5 @ 55 ml/hr, 30 ml Prostat daily, and 150 ml free water flush 4 times daily.  ? ?Per MD notes, TF was held last night due to suspected aspiration but now requesting resumption of TF at slow rate.  ? ?Reviewed wt hx; no wt loss noted over the past 6 months.  ? ?Labs reviewed: CBGS: 75-94 (inpatient orders for glycemic control are 0-15 insulin aspart every 4 hours).   ? ?Diet Order:   ?Diet Order   ? ?       ?  Diet NPO time specified  Diet effective now       ?  ? ?  ?  ? ?  ? ? ?EDUCATION NEEDS:  ? ?Not appropriate for education at this time ? ?Skin:  Skin Assessment: Reviewed RN Assessment ? ?Last BM:  Unknown ? ?Height:  ? ?Ht Readings from Last 1 Encounters:  ?08/27/21 5\' 6"  (1.676 m)  ? ? ?Weight:  ? ?Wt Readings from Last 1 Encounters:  ?08/27/21 76.8 kg  ? ? ?Ideal Body Weight:  59.1 kg ? ?BMI:  Body mass index is 27.33 kg/m?. ? ?Estimated Nutritional Needs:  ? ?Kcal:  1700-1900 ? ?Protein:  90-105 grams ? ?Fluid:  > 1.7 L ? ? ? ?08/29/21, RD, LDN, CDCES ?Registered Dietitian II ?Certified Diabetes Care and Education Specialist ?Please refer to [facility] for RD and/or RD on-call/weekend/after hours pager  ?

## 2021-08-29 DIAGNOSIS — J9601 Acute respiratory failure with hypoxia: Secondary | ICD-10-CM

## 2021-08-29 DIAGNOSIS — J9602 Acute respiratory failure with hypercapnia: Secondary | ICD-10-CM

## 2021-08-29 LAB — GLUCOSE, CAPILLARY
Glucose-Capillary: 110 mg/dL — ABNORMAL HIGH (ref 70–99)
Glucose-Capillary: 105 mg/dL — ABNORMAL HIGH (ref 70–99)
Glucose-Capillary: 110 mg/dL — ABNORMAL HIGH (ref 70–99)
Glucose-Capillary: 121 mg/dL — ABNORMAL HIGH (ref 70–99)

## 2021-08-29 LAB — CBC WITH DIFFERENTIAL/PLATELET
Abs Immature Granulocytes: 0.04 10*3/uL (ref 0.00–0.07)
Basophils Absolute: 0 10*3/uL (ref 0.0–0.1)
Basophils Relative: 0 %
Eosinophils Absolute: 0.3 10*3/uL (ref 0.0–0.5)
Eosinophils Relative: 3 %
HCT: 30.5 % — ABNORMAL LOW (ref 36.0–46.0)
Hemoglobin: 9.9 g/dL — ABNORMAL LOW (ref 12.0–15.0)
Immature Granulocytes: 1 %
Lymphocytes Relative: 29 %
Lymphs Abs: 2.6 10*3/uL (ref 0.7–4.0)
MCH: 30.8 pg (ref 26.0–34.0)
MCHC: 32.5 g/dL (ref 30.0–36.0)
MCV: 95 fL (ref 80.0–100.0)
Monocytes Absolute: 0.6 10*3/uL (ref 0.1–1.0)
Monocytes Relative: 7 %
Neutro Abs: 5.3 10*3/uL (ref 1.7–7.7)
Neutrophils Relative %: 60 %
Platelets: 133 10*3/uL — ABNORMAL LOW (ref 150–400)
RBC: 3.21 MIL/uL — ABNORMAL LOW (ref 3.87–5.11)
RDW: 15 % (ref 11.5–15.5)
WBC: 8.8 10*3/uL (ref 4.0–10.5)
nRBC: 0 % (ref 0.0–0.2)

## 2021-08-29 LAB — BASIC METABOLIC PANEL
Anion gap: 5 (ref 5–15)
BUN: 10 mg/dL (ref 8–23)
CO2: 29 mmol/L (ref 22–32)
Calcium: 8.2 mg/dL — ABNORMAL LOW (ref 8.9–10.3)
Chloride: 101 mmol/L (ref 98–111)
Creatinine, Ser: 0.36 mg/dL — ABNORMAL LOW (ref 0.44–1.00)
GFR, Estimated: >60 mL/min (ref >60)
Glucose, Bld: 107 mg/dL — ABNORMAL HIGH (ref 70–99)
Potassium: 3.6 mmol/L (ref 3.5–5.1)
Sodium: 135 mmol/L (ref 135–145)

## 2021-08-29 LAB — MRSA NEXT GEN BY PCR, NASAL: MRSA by PCR Next Gen: DETECTED — AB

## 2021-08-29 MED ORDER — CHLORHEXIDINE GLUCONATE CLOTH 2 % EX PADS
6.0000 | MEDICATED_PAD | Freq: Every day | CUTANEOUS | Status: AC
  Administered 2021-08-29 – 2021-09-02 (×5): 6 via TOPICAL

## 2021-08-29 MED ORDER — VANCOMYCIN HCL 1500 MG/300ML IV SOLN
1500.0000 mg | INTRAVENOUS | Status: DC
  Administered 2021-08-29 – 2021-08-31 (×3): 1500 mg via INTRAVENOUS
  Filled 2021-08-29 (×3): qty 300

## 2021-08-29 MED ORDER — FUROSEMIDE 10 MG/ML IJ SOLN
40.0000 mg | Freq: Once | INTRAMUSCULAR | Status: AC
  Administered 2021-08-29: 40 mg via INTRAVENOUS
  Filled 2021-08-29: qty 4

## 2021-08-29 MED ORDER — SODIUM CHLORIDE 0.9 % IV SOLN
INTRAVENOUS | Status: DC | PRN
Start: 2021-08-29 — End: 2021-09-02

## 2021-08-29 MED ORDER — SODIUM CHLORIDE 0.9% FLUSH: 3.0000 mL | INTRAVENOUS | Status: DC | PRN

## 2021-08-29 MED ORDER — MUPIROCIN 2 % EX OINT
1.0000 "application " | TOPICAL_OINTMENT | Freq: Two times a day (BID) | CUTANEOUS | Status: DC
  Administered 2021-08-29 – 2021-09-01 (×6): 1 via NASAL
  Filled 2021-08-29: qty 22

## 2021-08-29 NOTE — Progress Notes (Signed)
?  Progress Note ? ? ?Patient: Kaitlyn Ruiz [id number] DOB: 1956/03/04 DOA: 08/27/2021     2 ?DOS: the patient was seen and examined on 08/29/2021 ?  ?Brief hospital course: ?No notes on file ? ?Assessment and Plan: ?* Acute respiratory failure with hypoxemia (HCC) ?Per report from EMS patient does not normally wear any oxygen and was requiring 3 L on arrival due to SPO2 in the high 80s.  Pt was reportedly eating earlier today per EMS.  High likelihood of aspiration.  CTA chest showed Interval development of asymmetric biapical pulmonary infiltrates, so can not rule out PNA.  Pt also has known hx of mucus plugging.   ?Plan: ?--treat for presumed PNA ?--start diuresis  ?--monitor for aspiration ? ?HCAP,  (healthcare-associated pneumonia) ?--due to recent septic shock from MSSA pneumonia and septic presentation during current admission, pt was started on treatment for HCAP with vanc and cefepime ?--CTA chest showed "Interval development of asymmetric biapical pulmonary infiltrates". ?Plan: ?--cont vanc and cefepime for now ? ?Sepsis ([place]) ?--temp 101.7, tachycardia, tachypnea, source PNA ? ?Recent mechanical ventilation 3/14-3/15/23 ?As above ? ?(HFpEF) heart failure with preserved ejection fraction ([place]) ?Recent echo showed grade 1 diastolic dysfunction ?--start IV lasix for empiric diuresis ? ?History of CVA (cerebrovascular accident) ?--cont ASA and statin ? ?Chronic hypotension ?Blood pressure stable.  Patient chronically on midodrine and Florinef ?--d/c midodrine since BP has been 130's-140's ?--cont Florinef for now ? 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  Resides in nursing facility ? 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 ? ?DM2, not currently active ?--A1c wnl for the past 2 years. ?--d/c BG checks and SSI ? ? ?  ? ?Subjective:  ?Pt denied pain,  dyspnea.  Pt could not cough on command. ? ? ?Physical Exam: ? ?Constitutional: NAD, oriented to person and place ?CV: No cyanosis.   ?RESP: normal respiratory effort ?Extremities: mild edema in BLE ?SKIN: warm, dry ? ? ?Data Reviewed: ? ?Family Communication:  ? ?Disposition: ?Status is: Inpatient ? ? Planned Discharge Destination: Skilled nursing facility ? ? ? ?Time spent: 50 minutes ? ?Author: ?Enzo Bi, MD ?08/29/2021 3:13 PM ? ?For on call review [email or web address]  ?

## 2021-08-29 NOTE — Progress Notes (Signed)
Arrived to patient bedside, patient alert with increased secretions, and decreased oxygenation to 88%.  Oxygen applied per protocol, with increase to 94% no further interventions at this time.   ?

## 2021-08-29 NOTE — Progress Notes (Signed)
Notified by lab of + MRSA swab.  NP Morrisson made aware. Standing orders inititated. ?

## 2021-08-29 NOTE — Progress Notes (Signed)
Pharmacy Antibiotic Note ? ?Kaitlyn Ruiz is a 66 y.o. female admitted on 08/27/2021 with sepsis.  Pharmacy has been consulted for Vancomycin, Cefepime dosing. Hx of MSSA in tracheal aspirate 08/11/2021.  ? ?Plan: ?Continue cefepime 2 g q8H.  ? ?Pt started vancomycin 1750 mg q24H. Will adjust to 1500 mg q24H per AUC calculation. Predicted AUC 471. Goal AUC 400-550. Plan to order vancomycin level after 4th or 5th dose. MRSA PCR was positive, which does not mean pt has an MRSA infection, but its not ruled out.  ? ? ?Height: 5\' 6"  (167.6 cm) ?Weight: 73.2 kg (161 lb 6 oz) ?IBW/kg (Calculated) : 59.3 ? ?Temp (24hrs), Avg:98.4 ?F (36.9 ?C), Min:97.2 ?F (36.2 ?C), Max:99.4 ?F (37.4 ?C) ? ?Recent Labs  ?Lab 08/23/21 ?0437 08/24/21 ?08/26/21 08/27/21 ?1931 08/27/21 ?2119 08/28/21 ?08/30/21 08/29/21 ?10/29/21  ?WBC 19.9* 18.9* 11.7*  --  11.8* 8.8  ?CREATININE 0.56 0.41* 0.46  --  <0.30* 0.36*  ?LATICACIDVEN  --   --  1.6 2.3* 1.1  --   ? ?  ?Estimated Creatinine Clearance: 73.7 mL/min (A) (by C-G formula based on SCr of 0.36 mg/dL (L)).   ? ?No Known Allergies ? ? ?Microbiology results: ?3/30 BCx: pending ? 4/1 MRSA PCR: positive ? ?Thank you for allowing pharmacy to be a part of this patient?s care. ? ?6/1, PharmD, ?08/29/2021 10:55 AM ? ?

## 2021-08-30 DIAGNOSIS — J9602 Acute respiratory failure with hypercapnia: Secondary | ICD-10-CM | POA: Diagnosis not present

## 2021-08-30 DIAGNOSIS — J9601 Acute respiratory failure with hypoxia: Secondary | ICD-10-CM | POA: Diagnosis not present

## 2021-08-30 LAB — BASIC METABOLIC PANEL
Anion gap: 4 — ABNORMAL LOW (ref 5–15)
BUN: 13 mg/dL (ref 8–23)
CO2: 32 mmol/L (ref 22–32)
Calcium: 8.2 mg/dL — ABNORMAL LOW (ref 8.9–10.3)
Chloride: 101 mmol/L (ref 98–111)
Creatinine, Ser: <0.3 mg/dL — ABNORMAL LOW (ref 0.44–1.00)
Glucose, Bld: 109 mg/dL — ABNORMAL HIGH (ref 70–99)
Potassium: 3.9 mmol/L (ref 3.5–5.1)
Sodium: 137 mmol/L (ref 135–145)

## 2021-08-30 LAB — MAGNESIUM: Magnesium: 1.7 mg/dL (ref 1.7–2.4)

## 2021-08-30 LAB — CBC
HCT: 29.7 % — ABNORMAL LOW (ref 36.0–46.0)
Hemoglobin: 9.7 g/dL — ABNORMAL LOW (ref 12.0–15.0)
MCH: 31.2 pg (ref 26.0–34.0)
MCHC: 32.7 g/dL (ref 30.0–36.0)
MCV: 95.5 fL (ref 80.0–100.0)
Platelets: 145 10*3/uL — ABNORMAL LOW (ref 150–400)
RBC: 3.11 MIL/uL — ABNORMAL LOW (ref 3.87–5.11)
RDW: 15.1 % (ref 11.5–15.5)
WBC: 6.8 10*3/uL (ref 4.0–10.5)
nRBC: 0 % (ref 0.0–0.2)

## 2021-08-30 MED ORDER — FUROSEMIDE 10 MG/ML IJ SOLN
40.0000 mg | Freq: Once | INTRAMUSCULAR | Status: AC
  Administered 2021-08-30: 40 mg via INTRAVENOUS
  Filled 2021-08-30: qty 4

## 2021-08-30 NOTE — Progress Notes (Signed)
?  Progress Note ? ? ?Patient: Kaitlyn Ruiz [id number] DOB: 09/25/1955 DOA: 08/27/2021     3 ?DOS: the patient was seen and examined on 08/30/2021 ?  ?Brief hospital course: ?No notes on file ? ?Assessment and Plan: ?* Acute respiratory failure with hypoxemia (HCC) ?Per report from EMS patient does not normally wear any oxygen and was requiring 3 L on arrival due to SPO2 in the high 80s.  Pt was reportedly eating earlier today per EMS.  High likelihood of aspiration.  CTA chest showed Interval development of asymmetric biapical pulmonary infiltrates, so can not rule out PNA.  Pt also has known hx of mucus plugging.   ?Plan: ?--treat for presumed PNA ?--cont IV lasix 40 mg BID for extra fluid removal ?--monitor for aspiration ? ?HCAP,  (healthcare-associated pneumonia) ?--due to recent septic shock from MSSA pneumonia and septic presentation during current admission, pt was started on treatment for HCAP with vanc and cefepime ?--CTA chest showed "Interval development of asymmetric biapical pulmonary infiltrates". ?Plan: ?--cont vanc and cefepime for now ? ?Sepsis ([place]) ?--temp 101.7, tachycardia, tachypnea, source PNA ? ?Recent mechanical ventilation 3/14-3/15/23 ?As above ? ?(HFpEF) heart failure with preserved ejection fraction ([place]) ?Recent echo showed grade 1 diastolic dysfunction ?--cont IV lasix 40 mg BID for extra fluid removal ? ?History of CVA (cerebrovascular accident) ?--cont ASA and statin ? ?Chronic hypotension ?Blood pressure stable.  Patient chronically on midodrine and Florinef ?--d/c'ed midodrine since BP has been 130's-140's ?--cont Florinef for now ? 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  Resides in nursing facility ? 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 ? ?DM2, not currently active ?--A1c wnl for the past 2 years. ?--d/c BG checks and  SSI ? ? ?  ? ?Subjective:  ?Remained fever-free. ? ? ?Physical Exam: ? ?Constitutional: NAD, lethargic ?CV: No cyanosis.   ?RESP: normal respiratory effort, on 2L ?Extremities: non-pitting edema in BLE ?SKIN: warm, dry ? ? ?Data Reviewed: ? ?Family Communication:  ? ?Disposition: ?Status is: Inpatient ? ? Planned Discharge Destination: Skilled nursing facility ? ? ? ?Time spent: 35 minutes ? ?Author: ?Enzo Bi, MD ?08/30/2021 4:07 PM ? ?For on call review [email or web address]  ?

## 2021-08-31 DIAGNOSIS — J9602 Acute respiratory failure with hypercapnia: Secondary | ICD-10-CM | POA: Diagnosis not present

## 2021-08-31 DIAGNOSIS — J9601 Acute respiratory failure with hypoxia: Secondary | ICD-10-CM | POA: Diagnosis not present

## 2021-08-31 LAB — CBC
HCT: 29.7 % — ABNORMAL LOW (ref 36.0–46.0)
Hemoglobin: 9.4 g/dL — ABNORMAL LOW (ref 12.0–15.0)
MCH: 30.3 pg (ref 26.0–34.0)
MCHC: 31.6 g/dL (ref 30.0–36.0)
MCV: 95.8 fL (ref 80.0–100.0)
Platelets: 167 10*3/uL (ref 150–400)
RBC: 3.1 MIL/uL — ABNORMAL LOW (ref 3.87–5.11)
RDW: 15.2 % (ref 11.5–15.5)
WBC: 7.2 10*3/uL (ref 4.0–10.5)
nRBC: 0 % (ref 0.0–0.2)

## 2021-08-31 LAB — BASIC METABOLIC PANEL
Anion gap: 4 — ABNORMAL LOW (ref 5–15)
BUN: 16 mg/dL (ref 8–23)
CO2: 36 mmol/L — ABNORMAL HIGH (ref 22–32)
Calcium: 8.4 mg/dL — ABNORMAL LOW (ref 8.9–10.3)
Chloride: 97 mmol/L — ABNORMAL LOW (ref 98–111)
Creatinine, Ser: <0.3 mg/dL — ABNORMAL LOW (ref 0.44–1.00)
Glucose, Bld: 96 mg/dL (ref 70–99)
Potassium: 3.7 mmol/L (ref 3.5–5.1)
Sodium: 137 mmol/L (ref 135–145)

## 2021-08-31 LAB — MAGNESIUM: Magnesium: 1.9 mg/dL (ref 1.7–2.4)

## 2021-08-31 MED ORDER — FLUDROCORTISONE ACETATE 0.1 MG PO TABS
0.0500 mg | ORAL_TABLET | Freq: Every day | ORAL | Status: DC
  Administered 2021-08-31 – 2021-09-02 (×3): 0.05 mg
  Filled 2021-08-31 (×3): qty 0.5

## 2021-08-31 MED ORDER — FUROSEMIDE 10 MG/ML IJ SOLN
40.0000 mg | Freq: Two times a day (BID) | INTRAMUSCULAR | Status: DC
  Administered 2021-08-31 – 2021-09-02 (×5): 40 mg via INTRAVENOUS
  Filled 2021-08-31 (×5): qty 4

## 2021-08-31 NOTE — Progress Notes (Signed)
?  Progress Note ? ? ?Patient: Kaitlyn Ruiz [id number] DOB: 09-Jun-1955 DOA: 08/27/2021     4 ?DOS: the patient was seen and examined on 08/31/2021 ?  ?Brief hospital course: ?No notes on file ? ?Assessment and Plan: ?* Acute respiratory failure with hypoxemia (HCC) ?Per report from EMS patient does not normally wear any oxygen and was requiring 3 L on arrival due to SPO2 in the high 80s.  Pt was reportedly eating earlier today per EMS.  High likelihood of aspiration.  CTA chest showed Interval development of asymmetric biapical pulmonary infiltrates, so can not rule out PNA.  Pt also has known hx of mucus plugging.   ?Plan: ?--treat for presumed PNA ?--cont IV lasix 40 mg BID for extra fluid removal ?--monitor for aspiration ? ?HCAP,  (healthcare-associated pneumonia) ?--due to recent septic shock from MSSA pneumonia and septic presentation during current admission, pt was started on treatment for HCAP with vanc and cefepime ?--CTA chest showed "Interval development of asymmetric biapical pulmonary infiltrates". ?Plan: ?--cont vanc and cefepime for 5 day course ? ?Sepsis ([place]) ?--temp 101.7, tachycardia, tachypnea, source PNA ? ?Recent mechanical ventilation 3/14-3/15/23 ?As above ? ?(HFpEF) heart failure with preserved ejection fraction ([place]) ?Recent echo showed grade 1 diastolic dysfunction ?--cont IV lasix 40 mg BID for extra fluid removal ? ?History of CVA (cerebrovascular accident) ?--cont ASA and statin ? ?Chronic hypotension ?Blood pressure stable.  Patient chronically on midodrine and Florinef ?--d/c'ed midodrine since BP has been 130's-140's ?--taper Florinef to 0.05 mg daily ? 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  Resides in nursing facility ? 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 ? ?DM2, not currently active ?--A1c wnl for the past 2  years. ?--d/c BG checks and SSI ? ? ?  ? ?Subjective:  ?Pt was able to shake her head when asked about dyspnea, but then stopped responding to any more questions. ? ? ?Physical Exam: ? ?Constitutional: NAD, sleeping, arousable, but unclear of orientation, not consistently answering questions, speech difficult to understand ?HEENT: conjunctivae and lids normal, EOMI ?CV: No cyanosis.   ?RESP: normal respiratory effort, on 3L ?Extremities: non-pitting edema in BLE ?SKIN: warm, dry ? ? ?Data Reviewed: ? ?Family Communication:  ? ?Disposition: ?Status is: Inpatient ? ? Planned Discharge Destination: Skilled nursing facility ? ? ? ?Time spent: 35 minutes ? ?Author: ?Enzo Bi, MD ?08/31/2021 4:38 PM ? ?For on call review [email or web address]  ?

## 2021-08-31 NOTE — [place] FL2 (Signed)
?[place] MEDICAID FL2 LEVEL OF CARE SCREENING TOOL  ?  ? ?IDENTIFICATION  ?Patient Name: ?ADRIA COSTLEY Birthdate: 1956/04/08 Sex: female Admission Date (Current Location): ?08/27/2021  ?[place] Number: ? [place] ?  Facility and Address:  ?[facility], [address] [place], [place] [address] ?     Provider Number: ?[id number]  ?Attending Physician Name and Address:  ?Darlin Priestly, MD ? Relative Name and Phone Number:  ?  ?   ?Current Level of Care: ?Hospital Recommended Level of Care: ?Skilled Nursing Facility Prior Approval Number: ?  ? ?Date Approved/Denied: ?  PASRR Number: ?  ? ?Discharge Plan: ?SNF ?  ? ?Current Diagnoses: ?Patient Active Problem List  ? Diagnosis Date Noted  ? Sepsis (HCC) 08/27/2021  ? HCAP,  (healthcare-associated pneumonia) 08/27/2021  ? Recent mechanical ventilation 3/14-3/15/23 08/27/2021  ? Chronic hypotension 08/27/2021  ? Hypernatremia 08/22/2021  ? MSSA (methicillin susceptible Staphylococcus aureus) pneumonia (HCC) 08/22/2021  ? Diarrhea 08/22/2021  ? (HFpEF) heart failure with preserved ejection fraction (HCC) 08/22/2021  ? History of CVA (cerebrovascular accident) 08/22/2021  ? Hypokalemia 08/22/2021  ? Acute respiratory failure with hypoxemia (HCC) 08/11/2021  ? Cerebral artery occlusion with cerebral infarction (HCC) 08/04/2021  ? Malfunction of percutaneous endoscopic gastrostomy (PEG) tube (HCC) 08/03/2021  ? Chronic hepatitis C (HCC) 08/03/2021  ? Depression 08/03/2021  ? Tobacco use disorder 08/03/2021  ? 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 Acute on chronic respiratory failure with hypoxia (HCC) 05/26/2019  ? 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/26/2019  ? UTI (urinary tract infection) 05/26/2019  ? 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 Dysphagia as late effect of cerebrovascular accident (CVA) 05/26/2019  ? Appetite impaired 08/15/2018  ? Anorexia 06/05/2018  ? Weakness generalized  06/05/2018  ? Esophageal dysphagia   ? Oropharyngeal dysphagia   ? Neurological dysfunction   ? Endotracheal tube present   ? Palliative care encounter   ? Acute metabolic encephalopathy   ? Septic shock (HCC) 04/10/2017  ? HTN (hypertension) 12/28/2013  ? ? ?Orientation RESPIRATION BLADDER Height & Weight   ?  ?Self ? O2 ([place] 3L) Continent Weight: 162 lb 4.1 oz (73.6 kg) ?Height:  5\' 6"  (167.6 cm)  ?BEHAVIORAL SYMPTOMS/MOOD NEUROLOGICAL BOWEL NUTRITION STATUS  ?    Continent Diet (PLEASE SEE DC SUMMARY)  ?AMBULATORY STATUS COMMUNICATION OF NEEDS Skin   ?Limited Assist Verbally Normal ?  ?  ?  ?    ?     ?     ? ? ?Personal Care Assistance Level of Assistance  ?Bathing, Feeding, Dressing Bathing Assistance: Limited assistance ?Feeding assistance: Independent ?Dressing Assistance: Limited assistance ?Total Care Assistance: Limited assistance  ? ?Functional Limitations Info  ?Sight, Hearing, Speech Sight Info: Adequate ?Hearing Info: Adequate ?Speech Info: Adequate  ? ? ?SPECIAL CARE FACTORS FREQUENCY  ?OT (By licensed OT), PT (By licensed PT)   ?  ?PT Frequency: 5x ?OT Frequency: 5x ?  ?  ?  ?   ? ? ?Contractures Contractures Info: Not present  ? ? ?Additional Factors Info  ?Code Status, Allergies Code Status Info: full code ?Allergies Info: no known allergies ?  ?  ?  ?   ? ?Current Medications (08/31/2021):  This is the current hospital active medication list ?Current Facility-Administered Medications  ?Medication Dose Route Frequency Provider Last Rate Last Admin  ? 0.9 %  sodium chloride infusion   Intravenous PRN 10/31/2021, MD   Stopped at 08/31/21 [phone]  ?  acetaminophen (TYLENOL) 160 MG/5ML solution 650 mg  650 mg Per Tube Q8H PRN Gilles Chiquito, MD   650 mg at 08/27/21 2215  ? aspirin chewable tablet 81 mg  81 mg Per Tube Daily Burnadette Pop, MD   81 mg at 08/31/21 1030  ? budesonide (PULMICORT) nebulizer solution 0.25 mg  0.25 mg Nebulization BID Burnadette Pop, MD   0.25 mg at 08/31/21 0751  ? ceFEPIme  (MAXIPIME) 2 g in sodium chloride 0.9 % 100 mL IVPB  2 g Intravenous Q8H Lindajo Royal V, MD 200 mL/hr at 08/31/21 1533 2 g at 08/31/21 1533  ? Chlorhexidine Gluconate Cloth 2 % PADS 6 each  6 each Topical Q0600 Darlin Priestly, MD   6 each at 08/31/21 [phone]  ? citalopram (CELEXA) tablet 10 mg  10 mg Per Tube Daily Burnadette Pop, MD   10 mg at 08/31/21 1030  ? enoxaparin (LOVENOX) injection 40 mg  40 mg Subcutaneous Q24H Lindajo Royal V, MD   40 mg at 08/31/21 1031  ? feeding supplement (GLUCERNA 1.5 CAL) liquid 1,000 mL  1,000 mL Per Tube Continuous Burnadette Pop, MD 55 mL/hr at 08/31/21 1548 1,000 mL at 08/31/21 1548  ? feeding supplement (PROSource TF) liquid 45 mL  45 mL Per Tube Daily Burnadette Pop, MD   45 mL at 08/31/21 1031  ? fludrocortisone (FLORINEF) tablet 0.05 mg  0.05 mg Per Tube Daily Darlin Priestly, MD   0.05 mg at 08/31/21 1030  ? free water 150 mL  150 mL Per Tube Q6H Adhikari, Amrit, MD   150 mL at 08/31/21 1249  ? furosemide (LASIX) injection 40 mg  40 mg Intravenous BID Darlin Priestly, MD   40 mg at 08/31/21 1533  ? gabapentin (NEURONTIN) 250 MG/5ML solution 300 mg  300 mg Per Tube Q8H Adhikari, Amrit, MD   300 mg at 08/31/21 1530  ? mupirocin ointment (BACTROBAN) 2 % 1 application.  1 application. Nasal BID Darlin Priestly, MD   1 application. at 08/31/21 1031  ? ondansetron (ZOFRAN) tablet 4 mg  4 mg Per Tube Q6H PRN Andris Baumann, MD      ? Or  ? ondansetron (ZOFRAN) injection 4 mg  4 mg Intravenous Q6H PRN Andris Baumann, MD      ? pravastatin (PRAVACHOL) tablet 20 mg  20 mg Per Tube QHS Burnadette Pop, MD   20 mg at 08/30/21 2111  ? sodium chloride flush (NS) 0.9 % injection 3 mL  3 mL Intravenous PRN Burnadette Pop, MD      ? valproic acid (DEPAKENE) 250 MG/5ML solution 125 mg  125 mg Per Tube BID Burnadette Pop, MD   125 mg at 08/31/21 1030  ? vancomycin (VANCOREADY) IVPB 1500 mg/300 mL  1,500 mg Intravenous Q24H Ronnald Ramp, [place]   Stopped at 08/31/21 6144  ? ? ? ?Discharge Medications: ?Please  see discharge summary for a list of discharge medications. ? ?Relevant Imaging Results: ? ?Relevant Lab Results: ? ? ?Additional Information ?SSN: [id number] ? ?Muad Noga A Amor Hyle, LCSW ? ? ? ? ?

## 2021-09-01 ENCOUNTER — Inpatient Hospital Stay: Payer: Medicare Other

## 2021-09-01 DIAGNOSIS — J9602 Acute respiratory failure with hypercapnia: Secondary | ICD-10-CM | POA: Diagnosis not present

## 2021-09-01 DIAGNOSIS — Z7189 Other specified counseling: Secondary | ICD-10-CM | POA: Diagnosis not present

## 2021-09-01 DIAGNOSIS — J9601 Acute respiratory failure with hypoxia: Secondary | ICD-10-CM | POA: Diagnosis not present

## 2021-09-01 LAB — CULTURE, BLOOD (ROUTINE X 2)
Culture: NO GROWTH
Culture: NO GROWTH
Special Requests: ADEQUATE

## 2021-09-01 LAB — CBC
HCT: 30 % — ABNORMAL LOW (ref 36.0–46.0)
Hemoglobin: 9.5 g/dL — ABNORMAL LOW (ref 12.0–15.0)
MCH: 30.4 pg (ref 26.0–34.0)
MCHC: 31.7 g/dL (ref 30.0–36.0)
MCV: 96.2 fL (ref 80.0–100.0)
Platelets: 175 10*3/uL (ref 150–400)
RBC: 3.12 MIL/uL — ABNORMAL LOW (ref 3.87–5.11)
RDW: 14.9 % (ref 11.5–15.5)
WBC: 10.1 10*3/uL (ref 4.0–10.5)
nRBC: 0 % (ref 0.0–0.2)

## 2021-09-01 LAB — BASIC METABOLIC PANEL
Anion gap: 5 (ref 5–15)
BUN: 19 mg/dL (ref 8–23)
CO2: 36 mmol/L — ABNORMAL HIGH (ref 22–32)
Calcium: 8.5 mg/dL — ABNORMAL LOW (ref 8.9–10.3)
Chloride: 96 mmol/L — ABNORMAL LOW (ref 98–111)
Creatinine, Ser: <0.3 mg/dL — ABNORMAL LOW (ref 0.44–1.00)
Glucose, Bld: 104 mg/dL — ABNORMAL HIGH (ref 70–99)
Potassium: 4 mmol/L (ref 3.5–5.1)
Sodium: 137 mmol/L (ref 135–145)

## 2021-09-01 LAB — MAGNESIUM: Magnesium: 1.9 mg/dL (ref 1.7–2.4)

## 2021-09-01 NOTE — Progress Notes (Signed)
?Progress Note ? ? ?Patient: Kaitlyn Ruiz [id number] DOB: Mar 20, 1956 DOA: 08/27/2021     5 ?DOS: the patient was seen and examined on 09/01/2021 ?  ?Brief hospital course: ?Kaitlyn Ruiz is a 66 y.o. female with medical history significant for CVA with residual left hemiparesis, aphasia and dysphagia requiring PEG tube and total care dependent, HFpEF (G1 DD 08/12/2021), T2DM, chronic hypotension on midodrine and Florinef, COPD, hospitalized from 3/14 to 08/24/2021 for septic shock and acute respiratory failure secondary to MSSA pneumonia requiring intubation, completing treatment with cefepime and Vanco followed by Unasyn and then Augmentin, receiving G-tube replacement on 3/20 due to malfunction, who presents to the ED from the skilled nursing facility where she resides, by EMS in acute respiratory distress of sudden onset about 20 minutes prior to arrival.  She arrived breathing at 28 breaths/min requiring 3 L O2.  ? ?Due to fever, meeting sepsis criteria and opacities on CXR, pt was started and finished another course of abx for HCAP with vanc and cefepime.  During the hospitalization, pt had about 3 episodes of acute respiratory distress and hypoxic, the last one after finishing HCAP treatment.  All episodes resolved quickly and pt was able to be weaned back to room air quickly.  These episodes are mostly likely aspiration events, which almost certainly will recur.   ? ?Palliative care had discussed with sister POA during last hospitalization, and POA had wanted everything done.  In light of pt's frequent aspiration events and almost certain recurrent hospitalizations, palliative care consulted again to help continue goals of care discussion. ? ?Assessment and Plan: ?* Acute respiratory failure with hypoxemia ([place]) ?Per report from EMS patient does not normally wear any oxygen and was requiring 3 L on arrival due to SPO2 in the high 80s.  Pt was reportedly eating earlier today per EMS.  High likelihood of  aspiration.  CTA chest showed Interval development of asymmetric biapical pulmonary infiltrates, so can not rule out PNA.  Pt also has known hx of mucus plugging.   ?--pt completed 5 days of vanc and cefepime. ?--During the hospitalization, pt had about 3 episodes of acute respiratory distress and hypoxic, the last one after finishing HCAP treatment.  All episodes resolved quickly and pt was able to be weaned back to room air quickly.  These episodes are mostly likely aspiration events, which almost certainly will recur.  ?Plan: ?--monitor for aspiration or excess secretion, and suction PRN ?--palliative consult today ?--hold further abx, since pt has had 2 courses of vanc/cefepime within a month, and her hypoxic events are due to aspiration ? ?HCAP,  (healthcare-associated pneumonia) ?--due to recent septic shock from MSSA pneumonia and septic presentation during current admission, pt was started on treatment for HCAP with vanc and cefepime ?--CTA chest showed "Interval development of asymmetric biapical pulmonary infiltrates". ?--completed vanc and cefepime for 5 day course ?--hold further abx, since pt has had 2 courses of vanc/cefepime within a month, and her hypoxic events are due to aspiration ? ?Sepsis ([place]) ?--temp 101.7, tachycardia, tachypnea, source PNA ? ?Recent mechanical ventilation 3/14-3/15/23 ?As above ? ?(HFpEF) heart failure with preserved ejection fraction ([place]) ?Recent echo showed grade 1 diastolic dysfunction ?--cont IV lasix 40 mg BID for extra fluid removal ? ?History of CVA (cerebrovascular accident) ?--cont ASA and statin ? ?Chronic hypotension ?Blood pressure stable.  Patient chronically on midodrine and Florinef ?--d/c'ed midodrine since BP has been 130's-140's ?--cont taper, currently Florinef to 0.05 mg daily ? ?Dysarthria as late effect  of cerebellar cerebrovascular accident (CVA) ?Total care dependent.  Resides in nursing facility ? ?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[place]) ?Secondary to dysphagia  from prior stroke ?--cont tube feed ?--monitor for aspiration events ?--palliative care consult today ? ? ?DM2, not currently active ?--A1c wnl for the past 2 years. ?--d/c BG checks and SSI ? ? ?  ? ?Subjective:  ?Pt had another event of acute hypoxia overnight, which resolved by this morning, likely aspiration event.   ? ?Palliative consult today. ? ? ?Physical Exam: ? ?Constitutional: NAD, lethargic, but arousable.  Was looking around and doing just head nods in response to questions ?HEENT: conjunctivae and lids normal, EOMI ?CV: No cyanosis.   ?RESP: normal respiratory effort, on 2L ?Extremities: mild edema in BLE ?SKIN: warm, dry ? ? ?Data Reviewed: ? ?Family Communication:  ? ?Disposition: ?Status is: Inpatient ? ? Planned Discharge Destination: Skilled nursing facility tomorrow ? ? ? ?Time spent: 50 minutes ? ?Author: ?Enzo Bi, MD ?09/01/2021 8:38 PM ? ?For on call review [email or web address].  ?
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
## 2021-09-01 NOTE — Progress Notes (Signed)
? ?      CROSS COVER NOTE ? ?NAME: VAUNA JUNGWIRTH ?MRN: [id number] ?DOB : July 28, 1955 ? ?Secure chat received from nursing reporting increased oxygen requirement. SPO2 82% on RA. Placed on 15L HFNC and oxygen saturation 100%. Patient with thin, clear oral secretions. Breath sounds are clear and diminished on auscultation. ? ?Plan: ?- CXR ?- Continue Vancomycin and Cefepime ?- 15L HFNC, wean as tolerated ?- Transfer to progressive for Frequent suctioning ?- Aspiration precautions ?- Incentive spirometry as able ?- Remain NPO  ? ? ?Neomia Glass MHA, MSN, FNP-BC ?Nurse Practitioner ?[facility] ?[place] ?Pager [phone] ? ?

## 2021-09-01 NOTE — Hospital Course (Signed)
Kaitlyn Ruiz is a 66 y.o. female with medical history significant for CVA with residual left hemiparesis, aphasia and dysphagia requiring PEG tube and total care dependent, HFpEF (G1 DD 08/12/2021), T2DM, chronic hypotension on midodrine and Florinef, COPD, hospitalized from 3/14 to 08/24/2021 for septic shock and acute respiratory failure secondary to MSSA pneumonia requiring intubation, completing treatment with cefepime and Vanco followed by Unasyn and then Augmentin, receiving G-tube replacement on 3/20 due to malfunction, who presents to the ED from the skilled nursing facility where she resides, by EMS in acute respiratory distress of sudden onset about 20 minutes prior to arrival.  She arrived breathing at 28 breaths/min requiring 3 L O2.  ? ?Due to fever, meeting sepsis criteria and opacities on CXR, pt was started and finished another course of abx for HCAP with vanc and cefepime.  During the hospitalization, pt had about 3 episodes of acute respiratory distress and hypoxic, the last one after finishing HCAP treatment.  All episodes resolved quickly and pt was able to be weaned back to room air quickly.  These episodes are mostly likely aspiration events, which almost certainly will recur.   ? ?Palliative care had discussed with sister POA during last hospitalization, and POA had wanted everything done.  In light of pt's frequent aspiration events and almost certain recurrent hospitalizations, palliative care consulted again to help continue goals of care discussion. ?

## 2021-09-02 DIAGNOSIS — I9589 Other hypotension: Secondary | ICD-10-CM

## 2021-09-02 DIAGNOSIS — Z931 Gastrostomy status: Secondary | ICD-10-CM

## 2021-09-02 DIAGNOSIS — I5032 Chronic diastolic (congestive) heart failure: Secondary | ICD-10-CM

## 2021-09-02 DIAGNOSIS — I69322 Dysarthria following cerebral infarction: Secondary | ICD-10-CM

## 2021-09-02 LAB — BASIC METABOLIC PANEL
Anion gap: 5 (ref 5–15)
BUN: 19 mg/dL (ref 8–23)
CO2: 38 mmol/L — ABNORMAL HIGH (ref 22–32)
Calcium: 8.7 mg/dL — ABNORMAL LOW (ref 8.9–10.3)
Chloride: 95 mmol/L — ABNORMAL LOW (ref 98–111)
Creatinine, Ser: <0.3 mg/dL — ABNORMAL LOW (ref 0.44–1.00)
Glucose, Bld: 109 mg/dL — ABNORMAL HIGH (ref 70–99)
Potassium: 3.6 mmol/L (ref 3.5–5.1)
Sodium: 138 mmol/L (ref 135–145)

## 2021-09-02 LAB — CBC
HCT: 31.7 % — ABNORMAL LOW (ref 36.0–46.0)
Hemoglobin: 10 g/dL — ABNORMAL LOW (ref 12.0–15.0)
MCH: 30.6 pg (ref 26.0–34.0)
MCHC: 31.5 g/dL (ref 30.0–36.0)
MCV: 96.9 fL (ref 80.0–100.0)
Platelets: 174 10*3/uL (ref 150–400)
RBC: 3.27 MIL/uL — ABNORMAL LOW (ref 3.87–5.11)
RDW: 14.5 % (ref 11.5–15.5)
WBC: 8.6 10*3/uL (ref 4.0–10.5)
nRBC: 0 % (ref 0.0–0.2)

## 2021-09-02 LAB — MAGNESIUM: Magnesium: 1.9 mg/dL (ref 1.7–2.4)

## 2021-09-02 MED ORDER — FREE WATER: 150.0000 mL | Freq: Four times a day (QID) | 0 refills | Status: DC

## 2021-09-02 MED ORDER — MIDODRINE HCL 5 MG PO TABS: 2.5000 mg | ORAL_TABLET | Freq: Three times a day (TID) | ORAL | Status: DC

## 2021-09-02 MED ORDER — GLUCERNA 1.5 CAL PO LIQD: 1000.0000 mL | ORAL | 0 refills | Status: DC

## 2021-09-02 MED ORDER — PROSOURCE TF PO LIQD
45.0000 mL | Freq: Every day | ORAL | 0 refills | Status: AC
Start: 2021-09-02 — End: ?

## 2021-09-02 NOTE — Discharge Summary (Signed)
?Physician Discharge Summary ?  ?Patient: Kaitlyn Ruiz MRN: [id number] DOB: 11-Feb-1956  ?Admit date:     08/27/2021  ?Discharge date: 09/02/21  ?Discharge Physician: Kaitlyn Ruiz  ? ?PCP: Kaitlyn Greenspan, MD  ? ?Recommendations at discharge:  ? ?follow-up with PCP in 1 to 2 weeks ?peg tube care per protocol ? ?Discharge Diagnoses: ?acute hypoxic respiratory failure improved ?suspected recurrent aspiration pneumonitis ?Sepsis-- improved ? ? ?Hospital Course: ?Kaitlyn Ruiz is a 66 y.o. female with medical history significant for CVA with residual left hemiparesis, aphasia and dysphagia requiring PEG tube and total care dependent, HFpEF (G1 DD 08/12/2021), T2DM, chronic hypotension on midodrine and Florinef, COPD, hospitalized from 3/14 to 08/24/2021 for septic shock and acute respiratory failure secondary to MSSA pneumonia requiring intubation, completing treatment with cefepime and Vanco followed by Unasyn and then Augmentin, receiving G-tube replacement on 3/20 due to malfunction, who presents to the ED from the skilled nursing facility where she resides, by EMS in acute respiratory distress of sudden onset about 20 minutes prior to arrival.  She arrived breathing at 28 breaths/min requiring 3 L O2.  ? ?Acute respiratory failure with hypoxemia suspected recurrent aspiration ?-- now resolved ?-- patient not on chronic oxygen ?-- use oxygen PRN at the facility to keep sats greater than 92% ?-- patient sats hundred percent on room air today ?-- completed five days of antibiotics ?-- patient likely episodes of respiratory distress recovers rather Quickly and could be due to aspiration events in the setting of peg feeding which family is aware can happen recurrently. ?-- No fever white count stable ? ?sepsis present on admission now resolved ? ?history of CVA ?--- continue aspirin statin ? ?chronic hypotension ?-- continue florinef and midodrine ? 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  She is out of facility long-term. ? ?Chronic peg tube secondary to dysphagia from prior stroke ?-- continue glucerna continuous feeding as per dietitian ? ?type II diabetes, well-controlled ?-- A1c within normal limit ?-- continue sliding scale insulin ? ?  ? ? ?Consultants: none ?Procedures performed: none  ?Disposition: Long term care facility ?Diet recommendation:  ?NPO PEG feeding ?DISCHARGE MEDICATION: ?Allergies as of 09/02/2021   ?No Known Allergies ?  ? ?  ?Medication List  ?  ? ?STOP taking these medications   ? ?feeding supplement (PRO-STAT SUGAR FREE 64) Liqd ?  ?fiber Pack packet ?  ? ?  ? ?TAKE these medications   ? ?Acidophilus 100 MG Caps ?Place 1 capsule into feeding tube 3 (three) times daily. ?  ?albuterol 108 (90 Base) MCG/ACT inhaler ?Commonly known as: VENTOLIN HFA ?Inhale 2 puffs into the lungs every 4 (four) hours as needed for wheezing or shortness of breath. ?  ?aspirin 81 MG chewable tablet ?Place 1 tablet (81 mg total) into feeding tube daily. ?  ?budesonide 0.25 MG/2ML nebulizer solution ?Commonly known as: PULMICORT ?Take 2 mLs (0.25 mg total) by nebulization 2 (two) times daily. ?  ?citalopram 10 MG tablet ?Commonly known as: CELEXA ?Place 10 mg into feeding tube daily. Vie PEG-Tube ?  ?docusate 50 MG/5ML liquid ?Commonly known as: COLACE ?Place 10 mLs (100 mg total) into feeding tube 2 (two) times daily. ?  ?feeding supplement (GLUCERNA 1.5 CAL) Liqd ?Place 1,000 mLs into feeding tube continuous. ?What changed:  ?how much to take ?when to take this ?  ?feeding supplement (PROSource TF) liquid ?Place 45 mLs into feeding tube daily. ?What changed: You were already taking a  medication with the same name, and this prescription was added. Make sure you understand how and when to take each. ?  ?fludrocortisone 0.1 MG tablet ?Commonly known as: FLORINEF ?Place 1 tablet (0.1 mg total) into feeding tube daily. ?  ?free water Soln ?Place 150 mLs into feeding tube every 6 (six) hours. ?What  changed:  ?how much to take ?when to take this ?  ?gabapentin 250 MG/5ML solution ?Commonly known as: NEURONTIN ?Place 6 mLs (300 mg total) into feeding tube every 8 (eight) hours. ?  ?insulin aspart 100 UNIT/ML injection ?Commonly known as: novoLOG ?Inject 3-9 Units into the skin every 4 (four) hours. ?What changed:  ?how much to take ?when to take this ?additional instructions ?  ?ipratropium-albuterol 0.5-2.5 (3) MG/3ML Soln ?Commonly known as: DUONEB ?Take 3 mLs by nebulization 2 (two) times daily as needed. ?  ?liver oil-zinc oxide 40 % ointment ?Commonly known as: DESITIN ?Apply topically as needed for irritation. ?  ?loperamide HCl 2 MG/15ML solution ?Commonly known as: IMODIUM ?Place 15 mLs (2 mg total) into feeding tube 4 (four) times daily as needed for diarrhea or loose stools. ?  ?midodrine 5 MG tablet ?Commonly known as: PROAMATINE ?Place 0.5 tablets (2.5 mg total) into feeding tube 3 (three) times daily with meals. If SBP is > 120, hold the dose. ?  ?mouth rinse Liqd solution ?15 mLs by Mouth Rinse route 2 times daily at 12 noon and 4 pm. ?What changed: when to take this ?  ?polyethylene glycol 17 g packet ?Commonly known as: MIRALAX / GLYCOLAX ?Place 17 g into feeding tube daily as needed for moderate constipation. ?  ?pravastatin 20 MG tablet ?Commonly known as: PRAVACHOL ?Place 20 mg into feeding tube at bedtime. Via PEG-Tube ?  ?valproic acid 250 MG/5ML solution ?Commonly known as: DEPAKENE ?Place 2.5 mLs (125 mg total) into feeding tube 2 (two) times daily. ?  ?Vitamin D3 1.25 MG (50000 UT) Caps ?Take 50,000 Units by mouth. Take every 56 days. ?What changed: Another medication with the same name was removed. Continue taking this medication, and follow the directions you see here. ?  ? ?  ? ? Follow-up Information   ? ? Kaitlyn GreenspanHodges, Beth, MD. Schedule an appointment as soon as possible for a visit in 1 week(s).   ?Specialty: Family Medicine ?Why: hospital f/u ?Contact information: ?[address] ?[address] ?[place] [address] ?[phone] ? ? ?  ?  ? ?  ?  ? ?  ? ?Discharge Exam: ?Filed Weights  ? 08/29/21 0537 08/30/21 0500 09/02/21 [id number]  ?Weight: 73.2 kg 73.6 kg 70 kg  ? ? ? ?Condition at discharge: fair ? ?The results of significant diagnostics from this hospitalization (including imaging, microbiology, ancillary and laboratory) are listed below for reference.  ? ?Imaging Studies: ?DG Abd 1 View ? ?Result Date: 08/15/2021 ?CLINICAL DATA:  PEG tube placement EXAM: ABDOMEN - 1 VIEW COMPARISON:  08/11/2021 FINDINGS: Contrast administered via indwelling percutaneous gastrostomy opacifies the stomach. No extraluminal contrast identified. IMPRESSION: Intraluminal gastrostomy placement confirmed. Electronically Signed   By: Charline BillsSriyesh  Krishnan M.D.   On: 08/15/2021 22:36  ? ?DG Abd 1 View ? ?Result Date: 08/11/2021 ?CLINICAL DATA:  No clinical history provided. EXAM: ABDOMEN - 1 VIEW COMPARISON:  01/04/2021 FINDINGS: External pacer paddles are noted. Moderate stool and air throughout the colon and down into the rectum. No evidence of free air. Foley catheter noted in the bladder. Feeding gastrostomy tube is noted in the upper abdomen. IMPRESSION: Moderate  stool and air throughout the colon. Electronically Signed   By: Rudie Meyer M.D.   On: 08/11/2021 16:02  ? ?CT Head Wo Contrast ? ?Result Date: 08/11/2021 ?CLINICAL DATA:  Mental status change.  Spitting up blood. EXAM: CT HEAD WITHOUT CONTRAST TECHNIQUE: Contiguous axial images were obtained from the base of the skull through the vertex without intravenous contrast. RADIATION DOSE REDUCTION: This exam was performed according to the departmental dose-optimization program which includes automated exposure control, adjustment of the mA and/or kV according to patient size and/or use of iterative reconstruction technique. COMPARISON:  CT head 05/14/2018 FINDINGS: Brain: There is no evidence of acute intracranial hemorrhage, mass lesion, brain edema or  extra-axial fluid collection. Moderate atrophy with diffuse prominence of the ventricles and subarachnoid spaces. Patchy and confluent low-density in the periventricular white matter has mildly progressed from 2019 a

## 2021-09-02 NOTE — Plan of Care (Signed)
?  Problem: Clinical Measurements: ?Goal: Signs and symptoms of infection will decrease ?Outcome: Progressing ?  ?Problem: Respiratory: ?Goal: Ability to maintain adequate ventilation will improve ?Outcome: Progressing ?  ?Problem: Education: ?Goal: Knowledge of General Education information will improve ?Description: Including pain rating scale, medication(s)/side effects and non-pharmacologic comfort measures ?Outcome: Progressing ?  ?Problem: Clinical Measurements: ?Goal: Ability to maintain clinical measurements within normal limits will improve ?Outcome: Progressing ?  ?Problem: Safety: ?Goal: Ability to remain free from injury will improve ?Outcome: Progressing ?  ?Problem: Skin Integrity: ?Goal: Risk for impaired skin integrity will decrease ?Outcome: Progressing ?  ?

## 2021-09-02 NOTE — Discharge Instructions (Signed)
PEG tube care per protocol ?

## 2021-09-02 NOTE — TOC Transition Note (Signed)
Transition of Care (TOC) - CM/SW Discharge Note ? ? ?Patient Details  ?Name: Kaitlyn Ruiz ?MRN: [id number] ?Date of Birth: 1956/02/20 ? ?Transition of Care (TOC) CM/SW Contact:  ?Demarkis Gheen A Emanual Lamountain, LCSW ?Phone Number: ?09/02/2021, 9:41 AM ? ? ?Clinical Narrative:   Clinical Social Worker facilitated patient discharge including contacting patient family and facility to confirm patient discharge plans.  Clinical information faxed to facility and family agreeable with plan.  CSW arranged ambulance transport via [facility] to [facility] .  RN to call [phone] for report prior to discharge. ? ? ? ? ? ?Final next level of care: Skilled Nursing Facility ?Barriers to Discharge: No Barriers Identified ? ? ?Patient Goals and CMS Choice ?  ?  ?  ? ?Discharge Placement ?  ?           ?Patient chooses bed at:  ([facility]) ?Patient to be transferred to facility by: [facility] ?Name of family member notified: [name] - sister ?Patient and family notified of of transfer: 09/02/21 ? ?Discharge Plan and Services ?  ?  ?           ?  ?  ?  ?  ?  ?  ?  ?  ?  ?  ? ?Social Determinants of Health (SDOH) Interventions ?  ? ? ?Readmission Risk Interventions ? ?  08/14/2021  ? 10:11 AM  ?Readmission Risk Prevention Plan  ?Transportation Screening Complete  ?PCP or Specialist Appt within 3-5 Days Complete  ?HRI or Home Care Consult Complete  ?Social Work Consult for Recovery Care Planning/Counseling Complete  ?Medication Review Oceanographer) Complete  ? ? ? ? ? ?

## 2021-09-02 NOTE — TOC Initial Note (Signed)
Transition of Care (TOC) - Initial/Assessment Note  ? ? ?Patient Details  ?Name: Kaitlyn Ruiz ?MRN: [id number] ?Date of Birth: Dec 01, 1955 ? ?Transition of Care (TOC) CM/SW Contact:    ?Kaitlyn Ruiz Kaitlyn Kooper Godshall, LCSW ?Phone Number: ?09/02/2021, 9:41 AM ? ?Clinical Narrative:    CSW spoke with pt's sister and she confirmed pt is from [facility]. Pt's sister agreeable for pt to return at d/c. CSW confirmed with [facility] pt can return and no auth needed as pt is LTC.              ? ? ?Expected Discharge Plan: Skilled Nursing Facility ?Barriers to Discharge: No Barriers Identified ? ? ?Patient Goals and CMS Choice ?  ?  ?  ? ?Expected Discharge Plan and Services ?Expected Discharge Plan: Skilled Nursing Facility ?  ?  ?  ?  ?Expected Discharge Date: 09/02/21               ?  ?  ?  ?  ?  ?  ?  ?  ?  ?  ? ?Prior Living Arrangements/Services ?  ?  ?Patient language and need for interpreter reviewed:: Yes ?Do you feel safe going back to the place where you live?: Yes      ?Need for Family Participation in Patient Care: Yes (Comment) ?Care giver support system in place?: Yes (comment) ?  ?Criminal Activity/Legal Involvement Pertinent to Current Situation/Hospitalization: No - Comment as needed ? ?Activities of Daily Living ?Home Assistive Devices/Equipment: Other (Comment) (from SNF) ?ADL Screening (condition at time of admission) ?Patient's cognitive ability adequate to safely complete daily activities?: No ?Is the patient deaf or have difficulty hearing?: No ?Does the patient have difficulty seeing, even when wearing glasses/contacts?: No ?Does the patient have difficulty concentrating, remembering, or making decisions?: Yes ?Patient able to express need for assistance with ADLs?: No ?Does the patient have difficulty dressing or bathing?: Yes ?Independently performs ADLs?: No ?Communication: Dependent ?Is this Kaitlyn change from baseline?: Pre-admission baseline ?Dressing (OT): Dependent ?Is this Kaitlyn change from baseline?:  Pre-admission baseline ?Grooming: Dependent ?Is this Kaitlyn change from baseline?: Pre-admission baseline ?Feeding: Dependent ?Is this Kaitlyn change from baseline?: Pre-admission baseline ?Bathing: Dependent ?Is this Kaitlyn change from baseline?: Pre-admission baseline ?Toileting: Dependent ?Is this Kaitlyn change from baseline?: Pre-admission baseline ?In/Out Bed: Dependent ?Is this Kaitlyn change from baseline?: Pre-admission baseline ?Walks in Home: Dependent ?Is this Kaitlyn change from baseline?: Pre-admission baseline ?Does the patient have difficulty walking or climbing stairs?: Yes ?Weakness of Legs: Both ?Weakness of Arms/Hands: Both ? ?Permission Sought/Granted ?Permission sought to share information with : Family Supports ?  ? Share Information with NAME: Kaitlyn Ruiz ? Permission granted to share info w [facility]ENC[facility]: [facility] ? Permission granted to share info w Relationship: sister ?   ? ?Emotional Assessment ?Appearance:: Appears stated age ?Attitude/Demeanor/Rapport: Unable to Assess ?Affect (typically observed): Unable to Assess ?Orientation: : Oriented to Place, Oriented to Self ?Alcohol / Substance Use: Not Applicable ?Psych Involvement: No (comment) ? ?Admission diagnosis:  Sepsis (HCC) [A41.9] ?Pneumonia of both lungs due to infectious organism, unspecified part of lung [J18.9] ?Sepsis, due to unspecified organism, unspecified whether acute organ dysfunction present (HCC) [A41.9] ?Patient Active Problem List  ? Diagnosis Date Noted  ? Sepsis (HCC) 08/27/2021  ? HCAP,  (healthcare-associated pneumonia) 08/27/2021  ? Recent mechanical ventilation 3/14-3/15/23 08/27/2021  ? Chronic hypotension 08/27/2021  ? Hypernatremia 08/22/2021  ? MSSA (methicillin susceptible Staphylococcus aureus) pneumonia (HCC) 08/22/2021  ? Diarrhea 08/22/2021  ? (HFpEF) heart  failure with preserved ejection fraction (HCC) 08/22/2021  ? History of CVA (cerebrovascular accident) 08/22/2021  ? Hypokalemia 08/22/2021  ? Acute respiratory failure with hypoxemia (HCC)  08/11/2021  ? Cerebral artery occlusion with cerebral infarction (HCC) 08/04/2021  ? Malfunction of percutaneous endoscopic gastrostomy (PEG) tube (HCC) 08/03/2021  ? Chronic hepatitis C (HCC) 08/03/2021  ? Depression 08/03/2021  ? Tobacco use disorder 08/03/2021  ? 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 Acute on chronic respiratory failure with hypoxia (HCC) 05/26/2019  ? 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/26/2019  ? UTI (urinary tract infection) 05/26/2019  ? 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 Dysphagia as late effect of cerebrovascular accident (CVA) 05/26/2019  ? Appetite impaired 08/15/2018  ? Anorexia 06/05/2018  ? Weakness generalized 06/05/2018  ? Esophageal dysphagia   ? Oropharyngeal dysphagia   ? Neurological dysfunction   ? Endotracheal tube present   ? Palliative care encounter   ? Acute metabolic encephalopathy   ? Septic shock (HCC) 04/10/2017  ? HTN (hypertension) 12/28/2013  ? ?PCP:  Kaitlyn Greenspan, MD ?Pharmacy:   ?[facility] [place], [place] - [address] ?[address] ?[place] [address] ?Phone: [phone] Fax: [phone] ? ? ? ? ?Social Determinants of Health (SDOH) Interventions ?  ? ?Readmission Risk Interventions ? ?  08/14/2021  ? 10:11 AM  ?Readmission Risk Prevention Plan  ?Transportation Screening Complete  ?PCP or Specialist Appt within 3-5 Days Complete  ?HRI or Home Care Consult Complete  ?Social Work Consult for Recovery Care Planning/Counseling Complete  ?Medication Review Oceanographer) Complete  ? ? ? ?

## 2021-09-03 ENCOUNTER — Encounter: Payer: Self-pay | Admitting: Emergency Medicine

## 2021-09-03 ENCOUNTER — Emergency Department: Payer: Medicare Other

## 2021-09-03 ENCOUNTER — Observation Stay
Admission: EM | Admit: 2021-09-03 | Discharge: 2021-09-05 | Disposition: A | Discharge status: Skilled Nursing Facility | Payer: Medicare Other | Attending: Internal Medicine | Admitting: Internal Medicine

## 2021-09-03 ENCOUNTER — Other Ambulatory Visit: Payer: Self-pay

## 2021-09-03 DIAGNOSIS — T85598A Other mechanical complication of other gastrointestinal prosthetic devices, implants and grafts, initial encounter: Secondary | ICD-10-CM | POA: Diagnosis present

## 2021-09-03 DIAGNOSIS — R1310 Dysphagia, unspecified: Secondary | ICD-10-CM | POA: Insufficient documentation

## 2021-09-03 DIAGNOSIS — I5030 Unspecified diastolic (congestive) heart failure: Secondary | ICD-10-CM | POA: Insufficient documentation

## 2021-09-03 DIAGNOSIS — E119 Type 2 diabetes mellitus without complications: Secondary | ICD-10-CM | POA: Insufficient documentation

## 2021-09-03 DIAGNOSIS — J449 Chronic obstructive pulmonary disease, unspecified: Secondary | ICD-10-CM | POA: Diagnosis not present

## 2021-09-03 DIAGNOSIS — R4701 Aphasia: Secondary | ICD-10-CM | POA: Insufficient documentation

## 2021-09-03 DIAGNOSIS — Z87891 Personal history of nicotine dependence: Secondary | ICD-10-CM | POA: Diagnosis not present

## 2021-09-03 DIAGNOSIS — Y833 Surgical operation with formation of external stoma as the cause of abnormal reaction of the patient, or of later complication, without mention of misadventure at the time of the procedure: Secondary | ICD-10-CM | POA: Diagnosis not present

## 2021-09-03 DIAGNOSIS — A419 Sepsis, unspecified organism: Secondary | ICD-10-CM | POA: Diagnosis present

## 2021-09-03 DIAGNOSIS — I69354 Hemiplegia and hemiparesis following cerebral infarction affecting left non-dominant side: Secondary | ICD-10-CM | POA: Diagnosis not present

## 2021-09-03 DIAGNOSIS — I10 Essential (primary) hypertension: Secondary | ICD-10-CM | POA: Diagnosis present

## 2021-09-03 DIAGNOSIS — Z79899 Other long term (current) drug therapy: Secondary | ICD-10-CM | POA: Insufficient documentation

## 2021-09-03 DIAGNOSIS — Z794 Long term (current) use of insulin: Secondary | ICD-10-CM | POA: Insufficient documentation

## 2021-09-03 DIAGNOSIS — Z431 Encounter for attention to gastrostomy: Secondary | ICD-10-CM

## 2021-09-03 DIAGNOSIS — I110 Hypertensive heart disease with heart failure: Secondary | ICD-10-CM | POA: Insufficient documentation

## 2021-09-03 DIAGNOSIS — I9589 Other hypotension: Secondary | ICD-10-CM | POA: Diagnosis not present

## 2021-09-03 DIAGNOSIS — K9429 Other complications of gastrostomy: Principal | ICD-10-CM | POA: Insufficient documentation

## 2021-09-03 DIAGNOSIS — Z8673 Personal history of transient ischemic attack (TIA), and cerebral infarction without residual deficits: Secondary | ICD-10-CM

## 2021-09-03 LAB — CBC WITH DIFFERENTIAL/PLATELET
Abs Immature Granulocytes: 0.04 10*3/uL (ref 0.00–0.07)
Basophils Absolute: 0 10*3/uL (ref 0.0–0.1)
Basophils Relative: 0 %
Eosinophils Absolute: 0 10*3/uL (ref 0.0–0.5)
Eosinophils Relative: 0 %
HCT: 36.6 % (ref 36.0–46.0)
Hemoglobin: 11.6 g/dL — ABNORMAL LOW (ref 12.0–15.0)
Immature Granulocytes: 0 %
Lymphocytes Relative: 18 %
Lymphs Abs: 2 10*3/uL (ref 0.7–4.0)
MCH: 29.9 pg (ref 26.0–34.0)
MCHC: 31.7 g/dL (ref 30.0–36.0)
MCV: 94.3 fL (ref 80.0–100.0)
Monocytes Absolute: 0.4 10*3/uL (ref 0.1–1.0)
Monocytes Relative: 4 %
Neutro Abs: 8.6 10*3/uL — ABNORMAL HIGH (ref 1.7–7.7)
Neutrophils Relative %: 78 %
Platelets: 209 10*3/uL (ref 150–400)
RBC: 3.88 MIL/uL (ref 3.87–5.11)
RDW: 14.6 % (ref 11.5–15.5)
WBC: 11 10*3/uL — ABNORMAL HIGH (ref 4.0–10.5)
nRBC: 0 % (ref 0.0–0.2)

## 2021-09-03 MED ORDER — DEXTROSE IN LACTATED RINGERS 5 % IV SOLN: INTRAVENOUS | Status: AC

## 2021-09-03 MED ORDER — ENOXAPARIN SODIUM 40 MG/0.4ML IJ SOSY
40.0000 mg | PREFILLED_SYRINGE | INTRAMUSCULAR | Status: DC
  Administered 2021-09-04 – 2021-09-05 (×2): 40 mg via SUBCUTANEOUS
  Filled 2021-09-03 (×2): qty 0.4

## 2021-09-03 NOTE — ED Notes (Signed)
RN spoke to RN at the facility.[facility] Staff stated that pt was sent for verification due to KUB was done and did not verify placement and facility's MD sent pt here.  ?
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
## 2021-09-03 NOTE — ED Provider Notes (Signed)
? ?[facility] ?Provider Note ? ? ? Event Date/Time  ? First MD Initiated Contact with Patient 09/03/21 2116   ?  (approximate) ? ? ?History  ? ?G tube placement verification ? ? ?HPI ? ?Kaitlyn Ruiz is a 66 y.o. female with a history of CVA with left hemiparesis, aphasia, dysphagia requiring PEG tube, CHF, type 2 diabetes, COPD, and chronic hypotension presents for confirmation of a PEG tube.  The patient is unable to give any history.  It is unclear whether the PEG tube was dislodged today for placed at her facility.  The RN attempted to contact the facility to get more information but the nurse there did not have any further details. ? ?  ? ? ?Physical Exam  ? ?Triage Vital Signs: ?ED Triage Vitals  ?Enc Vitals Group  ?   BP 09/03/21 2122 122/87  ?   Pulse Rate 09/03/21 2122 99  ?   Resp 09/03/21 2122 18  ?   Temp 09/03/21 2122 99 ?F (37.2 ?C)  ?   Temp Source 09/03/21 2122 Axillary  ?   SpO2 09/03/21 2121 95 %  ?   Weight 09/03/21 2123 162 lb (73.5 kg)  ?   Height 09/03/21 2123 5\' 6"  (1.676 m)  ?   Head Circumference --   ?   Peak Flow --   ?   Pain Score --   ?   Pain Loc --   ?   Pain Edu? --   ?   Excl. in GC? --   ? ? ?Most recent vital signs: ?Vitals:  ? 09/03/21 2122 09/03/21 2300  ?BP: 122/87 (!) 153/66  ?Pulse: 99 94  ?Resp: 18 18  ?Temp: 99 ?F (37.2 ?C)   ?SpO2: 93% 100%  ? ? ? ?General: Awake, no distress.  ?CV:  Good peripheral perfusion.  ?Resp:  Normal effort.  ?Abd:  Soft and nontender.  PEG tube in place with no surrounding erythema, abnormal warmth, or drainage.  No distention.  ?Other:  No scleral icterus. ? ? ?ED Results / Procedures / Treatments  ? ?Labs ?(all labs ordered are listed, but only abnormal results are displayed) ?Labs Reviewed  ?CBC WITH DIFFERENTIAL/PLATELET - Abnormal; Notable for the following components:  ?    Result Value  ? WBC 11.0 (*)   ? Hemoglobin 11.6 (*)   ? Neutro Abs 8.6 (*)   ? All other components within normal limits  ?BASIC METABOLIC PANEL   ? ? ? ?EKG ? ? ? ? ?RADIOLOGY ? ?XR abdomen: I independently viewed and interpreted the images; there is contrast in the bowel and stomach but PEG tube does not appear to be in the gastric lumen. ? ?PROCEDURES: ? ?Critical Care performed: No ? ?Procedures ? ? ?MEDICATIONS ORDERED IN ED: ?Medications  ?enoxaparin (LOVENOX) injection 40 mg (has no administration in time range)  ? ? ? ?IMPRESSION / MDM / ASSESSMENT AND PLAN / ED COURSE  ?I reviewed the triage vital signs and the nursing notes. ? ?66 year old female with PMH as above was sent from her care facility for evaluation of PEG tube placement is unclear whether the tube was dislodged or replaced today. ? ?On exam the vital signs are normal and the patient is comfortable appearing.  The abdomen is soft and nontender.  The PEG tube appears in a normal position with no abnormal drainage or surrounding skin changes. ? ?We will obtain an x-ray with contrast for further evaluation. ? ?----------------------------------------- ?11:40  PM on 09/03/2021 ?----------------------------------------- ? ?X-ray shows that there is contrast in the stomach and small intestine, however the tip of the tube is not in the gastric lumen and is probably somewhere in the tract.  I discussed this with the radiologist Dr. Ramiro Harvest who advises that although the patient should have a well-established tract, he would not recommend attempting to replacement except under IR guidance.  Since this will not be able to be done until the morning, I consulted Dr. Margo Aye from the hospitalist service for admission.  Based on our discussion, she agrees to admit the patient ? ? ?FINAL CLINICAL IMPRESSION(S) / ED DIAGNOSES  ? ?Final diagnoses:  ?PEG (percutaneous endoscopic gastrostomy) adjustment/replacement/removal (HCC)  ? ? ? ?Rx / DC Orders  ? ?ED Discharge Orders   ? ? None  ? ?  ? ? ? ?Note:  This document was prepared using Dragon voice recognition software and may include unintentional dictation  errors.  ?  Dionne Bucy, MD ?09/03/21 2341 ? ?

## 2021-09-03 NOTE — ED Notes (Signed)
X-ray at bedside

## 2021-09-03 NOTE — ED Triage Notes (Signed)
Pt coming from [facility] via [facility]. Per EMS pt was sent out from facility to verify g-tube placement. Pt is nonverbal.  ?

## 2021-09-04 ENCOUNTER — Observation Stay: Payer: Medicare Other | Admitting: Radiology

## 2021-09-04 ENCOUNTER — Observation Stay: Payer: Medicare Other

## 2021-09-04 DIAGNOSIS — Z8673 Personal history of transient ischemic attack (TIA), and cerebral infarction without residual deficits: Secondary | ICD-10-CM | POA: Diagnosis not present

## 2021-09-04 DIAGNOSIS — R6520 Severe sepsis without septic shock: Secondary | ICD-10-CM

## 2021-09-04 DIAGNOSIS — A419 Sepsis, unspecified organism: Secondary | ICD-10-CM

## 2021-09-04 DIAGNOSIS — J9600 Acute respiratory failure, unspecified whether with hypoxia or hypercapnia: Secondary | ICD-10-CM

## 2021-09-04 DIAGNOSIS — T85598A Other mechanical complication of other gastrointestinal prosthetic devices, implants and grafts, initial encounter: Secondary | ICD-10-CM | POA: Diagnosis not present

## 2021-09-04 HISTORY — PX: IR REPLACE G-TUBE SIMPLE WO FLUORO: IMG2323

## 2021-09-04 LAB — BASIC METABOLIC PANEL
Anion gap: 10 (ref 5–15)
BUN: 12 mg/dL (ref 8–23)
CO2: 32 mmol/L (ref 22–32)
Calcium: 9.1 mg/dL (ref 8.9–10.3)
Chloride: 92 mmol/L — ABNORMAL LOW (ref 98–111)
Creatinine, Ser: 0.47 mg/dL (ref 0.44–1.00)
GFR, Estimated: >60 mL/min (ref >60)
Glucose, Bld: 73 mg/dL (ref 70–99)
Potassium: 3.8 mmol/L (ref 3.5–5.1)
Sodium: 134 mmol/L — ABNORMAL LOW (ref 135–145)

## 2021-09-04 LAB — URINALYSIS, COMPLETE (UACMP) WITH MICROSCOPIC
Bilirubin Urine: NEGATIVE
Glucose, UA: NEGATIVE mg/dL
Hgb urine dipstick: NEGATIVE
Ketones, ur: NEGATIVE mg/dL
Nitrite: NEGATIVE
Protein, ur: 30 mg/dL — AB
Specific Gravity, Urine: 1.021 (ref 1.005–1.030)
Squamous Epithelial / HPF: NONE SEEN (ref 0–5)
pH: 8 (ref 5.0–8.0)

## 2021-09-04 LAB — GLUCOSE, CAPILLARY
Glucose-Capillary: 97 mg/dL (ref 70–99)
Glucose-Capillary: 103 mg/dL — ABNORMAL HIGH (ref 70–99)

## 2021-09-04 MED ORDER — FLUDROCORTISONE ACETATE 0.1 MG PO TABS
0.1000 mg | ORAL_TABLET | Freq: Every day | ORAL | Status: DC
  Filled 2021-09-04: qty 1

## 2021-09-04 MED ORDER — OSMOLITE 1.2 CAL PO LIQD: 1000.0000 mL | ORAL | Status: DC

## 2021-09-04 MED ORDER — ALBUTEROL SULFATE (2.5 MG/3ML) 0.083% IN NEBU: 3.0000 mL | INHALATION_SOLUTION | RESPIRATORY_TRACT | Status: DC | PRN

## 2021-09-04 MED ORDER — ASPIRIN 81 MG PO CHEW
81.0000 mg | CHEWABLE_TABLET | Freq: Every day | ORAL | Status: DC
  Administered 2021-09-05: 81 mg
  Filled 2021-09-04: qty 1

## 2021-09-04 MED ORDER — FLORANEX PO PACK
1.0000 g | PACK | Freq: Three times a day (TID) | ORAL | Status: DC
  Administered 2021-09-04 – 2021-09-05 (×3): 1 g
  Filled 2021-09-04 (×5): qty 1

## 2021-09-04 MED ORDER — VALPROIC ACID 250 MG/5ML PO SOLN
125.0000 mg | Freq: Two times a day (BID) | ORAL | Status: DC
  Administered 2021-09-04 – 2021-09-05 (×2): 125 mg
  Filled 2021-09-04 (×3): qty 5

## 2021-09-04 MED ORDER — DOCUSATE SODIUM 50 MG/5ML PO LIQD
100.0000 mg | Freq: Two times a day (BID) | ORAL | Status: DC
  Administered 2021-09-04 – 2021-09-05 (×2): 100 mg
  Filled 2021-09-04 (×3): qty 10

## 2021-09-04 MED ORDER — GLUCERNA 1.5 CAL PO LIQD
1000.0000 mL | ORAL | Status: DC
  Administered 2021-09-04: 1000 mL

## 2021-09-04 MED ORDER — PRAVASTATIN SODIUM 20 MG PO TABS
20.0000 mg | ORAL_TABLET | Freq: Every day | ORAL | Status: DC
  Administered 2021-09-04: 20 mg
  Filled 2021-09-04: qty 1

## 2021-09-04 MED ORDER — MIDODRINE HCL 5 MG PO TABS: 2.5000 mg | ORAL_TABLET | Freq: Three times a day (TID) | ORAL | Status: DC

## 2021-09-04 MED ORDER — FLUDROCORTISONE ACETATE 0.1 MG PO TABS
0.0500 mg | ORAL_TABLET | Freq: Every day | ORAL | Status: DC
  Administered 2021-09-05: 0.05 mg
  Filled 2021-09-04: qty 0.5

## 2021-09-04 MED ORDER — GABAPENTIN 250 MG/5ML PO SOLN
300.0000 mg | Freq: Three times a day (TID) | ORAL | Status: DC
  Administered 2021-09-04 – 2021-09-05 (×3): 300 mg
  Filled 2021-09-04 (×5): qty 6

## 2021-09-04 MED ORDER — ACETAMINOPHEN 10 MG/ML IV SOLN
1000.0000 mg | Freq: Four times a day (QID) | INTRAVENOUS | Status: AC
  Administered 2021-09-04 – 2021-09-05 (×4): 1000 mg via INTRAVENOUS
  Filled 2021-09-04 (×4): qty 100

## 2021-09-04 MED ORDER — CITALOPRAM HYDROBROMIDE 10 MG PO TABS
10.0000 mg | ORAL_TABLET | Freq: Every day | ORAL | Status: DC
  Administered 2021-09-05: 10 mg
  Filled 2021-09-04: qty 1

## 2021-09-04 MED ORDER — DIATRIZOATE MEGLUMINE & SODIUM 66-10 % PO SOLN
30.0000 mL | Freq: Once | ORAL | Status: AC
  Administered 2021-09-04: 30 mL

## 2021-09-04 MED ORDER — FLUDROCORTISONE ACETATE 0.1 MG PO TABS: 0.1000 mg | ORAL_TABLET | Freq: Every day | ORAL | Status: DC

## 2021-09-04 MED ORDER — PROSOURCE TF PO LIQD
45.0000 mL | Freq: Two times a day (BID) | ORAL | Status: DC
  Administered 2021-09-04 – 2021-09-05 (×2): 45 mL
  Filled 2021-09-04 (×3): qty 45

## 2021-09-04 MED ORDER — BUDESONIDE 0.25 MG/2ML IN SUSP
0.2500 mg | Freq: Two times a day (BID) | RESPIRATORY_TRACT | Status: DC
  Administered 2021-09-04 – 2021-09-05 (×3): 0.25 mg via RESPIRATORY_TRACT
  Filled 2021-09-04 (×3): qty 2

## 2021-09-04 MED ORDER — GLYCOPYRROLATE 0.2 MG/ML IJ SOLN
0.2000 mg | Freq: Once | INTRAMUSCULAR | Status: AC
  Administered 2021-09-04: 0.2 mg via INTRAVENOUS
  Filled 2021-09-04: qty 1

## 2021-09-04 MED ORDER — FREE WATER
150.0000 mL | Freq: Four times a day (QID) | Status: DC
  Administered 2021-09-04 – 2021-09-05 (×5): 150 mL

## 2021-09-04 NOTE — Progress Notes (Signed)
Cross Cover ?NT suctioning needed to control airway secretions. Dose of robinol also ordered ? ?

## 2021-09-04 NOTE — NC FL2 (Signed)
?[place] MEDICAID FL2 LEVEL OF CARE SCREENING TOOL  ?  ? ?IDENTIFICATION  ?Patient Name: ?Kaitlyn Ruiz Birthdate: 01-Jul-1955 Sex: female Admission Date (Current Location): ?09/03/2021  ?[place] and [place] Number: ? [place] ?  Facility and Address:  ?[facility], [address] [place], [place] [address] ?     Provider Number: ?[id number]  ?Attending Physician Name and Address:  ?Lorella Nimrod, MD ? Relative Name and Phone Number:  ?  ?   ?Current Level of Care: ?Hospital Recommended Level of Care: ?[place] Prior Approval Number: ?  ? ?Date Approved/Denied: ?  PASRR Number: ?  ? ?Discharge Plan: ?SNF ?  ? ?Current Diagnoses: ?Patient Active Problem List  ? Diagnosis Date Noted  ? Feeding tube dysfunction 09/03/2021  ? Sepsis ([place]) 08/27/2021  ? HCAP,  (healthcare-associated pneumonia) 08/27/2021  ? Recent mechanical ventilation 3/14-3/15/23 08/27/2021  ? Chronic hypotension 08/27/2021  ? Hypernatremia 08/22/2021  ? MSSA (methicillin susceptible Staphylococcus aureus) pneumonia ([place]) 08/22/2021  ? Diarrhea 08/22/2021  ? (HFpEF) heart failure with preserved ejection fraction ([place]) 08/22/2021  ? History of CVA (cerebrovascular accident) 08/22/2021  ? Hypokalemia 08/22/2021  ? Acute respiratory failure with hypoxemia ([place]) 08/11/2021  ? Cerebral artery occlusion with cerebral infarction ([place]) 08/04/2021  ? Malfunction of percutaneous endoscopic gastrostomy (PEG) tube ([place]) 08/03/2021  ? Chronic hepatitis C ([place]) 08/03/2021  ? Depression 08/03/2021  ? Tobacco use disorder 08/03/2021  ? 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 Acute on chronic respiratory failure with hypoxia ([place]) 05/26/2019  ? COPD with chronic bronchitis ([place]) 05/26/2019  ? UTI (urinary tract infection) 05/26/2019  ? 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 Dysphagia as late effect of cerebrovascular accident (CVA) 05/26/2019  ? Appetite impaired 08/15/2018  ?  Anorexia 06/05/2018  ? Weakness generalized 06/05/2018  ? Esophageal dysphagia   ? Oropharyngeal dysphagia   ? Neurological dysfunction   ? Endotracheal tube present   ? Palliative care encounter   ? Acute metabolic encephalopathy   ? Septic shock ([place]) 04/10/2017  ? HTN (hypertension) 12/28/2013  ? ? ?Orientation RESPIRATION BLADDER Height & Weight   ?  ?Self ? O2 ( 2L) Incontinent Weight: 162 lb (73.5 kg) ?Height:  5\' 6"  (167.6 cm)  ?BEHAVIORAL SYMPTOMS/MOOD NEUROLOGICAL BOWEL NUTRITION STATUS  ?    Incontinent Diet (please see dc summary)  ?AMBULATORY STATUS COMMUNICATION OF NEEDS Skin   ?Extensive Assist Verbally Normal ?  ?  ?  ?    ?     ?     ? ? ?Personal Care Assistance Level of Assistance  ?Bathing, Feeding, Dressing Bathing Assistance: Maximum assistance ?Feeding assistance: Independent ?Dressing Assistance: Maximum assistance ?   ? ?Functional Limitations Info  ?Sight, Hearing, Speech Sight Info: Adequate ?Hearing Info: Adequate ?Speech Info: Adequate  ? ? ?SPECIAL CARE FACTORS FREQUENCY  ?PT (By licensed PT), OT (By licensed OT)   ?  ?PT Frequency: 5x ?OT Frequency: 5x ?  ?  ?  ?   ? ? ?Contractures Contractures Info: Not present  ? ? ?Additional Factors Info  ?Code Status, Allergies Code Status Info: full code ?Allergies Info: no known allergies ?  ?  ?  ?   ? ?Current Medications (09/04/2021):  This is the current hospital active medication list ?Current Facility-Administered Medications  ?Medication Dose Route Frequency Provider Last Rate Last Admin  ? acetaminophen (OFIRMEV) IV 1,000 mg  1,000 mg Intravenous Q6H Lorella Nimrod, MD      ?  albuterol (PROVENTIL) (2.5 MG/3ML) 0.083% nebulizer solution 3 mL  3 mL Inhalation Q4H PRN Lorella Nimrod, MD      ? aspirin chewable tablet 81 mg  81 mg Per Tube Daily Lorella Nimrod, MD      ? budesonide (PULMICORT) nebulizer solution 0.25 mg  0.25 mg Nebulization BID Lorella Nimrod, MD   0.25 mg at 09/04/21 [id number]  ? citalopram (CELEXA) tablet 10 mg  10 mg Per Tube  Daily Lorella Nimrod, MD      ? dextrose 5 % in lactated ringers infusion   Intravenous Continuous Kayleen Memos, DO 50 mL/hr at 09/04/21 0034 New Bag at 09/04/21 0034  ? docusate (COLACE) 50 MG/5ML liquid 100 mg  100 mg Per Tube BID Lorella Nimrod, MD      ? enoxaparin (LOVENOX) injection 40 mg  40 mg Subcutaneous Q24H Hall, Carole N, DO   40 mg at 09/04/21 0856  ? fludrocortisone (FLORINEF) tablet 0.1 mg  0.1 mg Per Tube Daily Lorella Nimrod, MD      ? free water 150 mL  150 mL Per Tube Q6H Lorella Nimrod, MD      ? gabapentin (NEURONTIN) 250 MG/5ML solution 300 mg  300 mg Per Tube Q8H Lorella Nimrod, MD      ? lactobacillus (FLORANEX/LACTINEX) granules 1 g  1 g Per Tube TID Lorella Nimrod, MD      ? pravastatin (PRAVACHOL) tablet 20 mg  20 mg Per Tube QHS Lorella Nimrod, MD      ? valproic acid (DEPAKENE) 250 MG/5ML solution 125 mg  125 mg Per Tube BID Lorella Nimrod, MD      ? ? ? ?Discharge Medications: ?Please see discharge summary for a list of discharge medications. ? ?Relevant Imaging Results: ? ?Relevant Lab Results: ? ? ?Additional Information ?[id number] ? ?[place] Mccuen A Aamori Mcmasters, LCSW ? ? ? ? ?

## 2021-09-04 NOTE — Progress Notes (Signed)
Initial Nutrition Assessment ? ?DOCUMENTATION CODES:  ? ?Not applicable ? ?INTERVENTION:  ? ?Initiate Glucerna 1.5 @ 25 ml/hr via g-tube and increase by 10 ml every 4 hours to goal rate of 55 ml/hr.  ?  ?45 ml Prosource TF daily ?  ?150 ml free water flush every 6 hours ?  ?Tube feeding regimen provides 2020 kcal (100% of needs), 120 grams of protein, and 1002 ml of H2O.  Total free water 1602 ml daily ? ?NUTRITION DIAGNOSIS:  ? ?Inadequate oral intake related to dysphagia as evidenced by NPO status. ? ?GOAL:  ? ?Patient will meet greater than or equal to 90% of their needs ? ?MONITOR:  ? ?Labs, Weight trends, TF tolerance, Skin, I & O's ? ?REASON FOR ASSESSMENT:  ? ?Low [place], Consult ?Enteral/tube feeding initiation and management ? ?ASSESSMENT:  ? ?Kaitlyn Ruiz is a 66 y.o. female with medical history significant for CVA with residual left hemiparesis, aphasia and dysphagia requiring PEG tube feedings and total care dependent, HFpEF, type 2 diabetes, chronic hypotension on midodrine and Florinef, COPD, discharged from this facility the day prior to presentation after being treated for acute hypoxic respiratory failure secondary to recurrent aspiration pneumonitis.  She presents from SNF due to PEG tube dysfunction.  In the ED, x-ray abdomen reveals gastrotomy catheter likely withdrawn from the gastric lumen and within the gastrotomy catheter tract.  No free extravasation of contrast.  At the time of this visit, patient is alert, in no acute distress.  She is aphasic.  When asked if she is hurting, she waves her head no.  EDP discussed case with radiology, who recommended IR's assistance for PEG tube dysfunction.  IR consult placed.  TRH was asked to admit.  Will admit as observation status and hydrate until PEG tube is replaced or addressed by interventional radiology on 09/04/21. ? ?Pt admitted with feeding tube dysfunction.  ? ?4/7- g-tube advanced and balloon inflated ? ?Pt very familiar to this RD due to  multiple previous admissions. She is a resident at [facility]). She is chronically NPO secondary to dysphagia and dependent on g-tube feeds. Home regimen is Glucerna 1.5 @ 55 ml/hr, 30 ml Prostat daily, and 150 ml free water flush 4 times daily.  ? ?Reviewed wt hx; no wt loss over the past 6 months.  ? ?Pt lying in bed at time of visit. She is unable to provide history, but smiled and looked towards this RD when greeted.  ? ?Case discussed with MD; ok to start TF.  ? ?Medications reviewed and include dextrose 5% in lactated ringers infusion @ 50 ml/hr.  ? ?Labs reviewed: Na: 134.   ? ?NUTRITION - FOCUSED PHYSICAL EXAM: ? ?Flowsheet Row Most Recent Value  ?Orbital Region Moderate depletion  ?Upper Arm Region No depletion  ?Thoracic and Lumbar Region No depletion  ?Buccal Region No depletion  ?Temple Region Mild depletion  ?Clavicle Bone Region No depletion  ?Clavicle and Acromion Bone Region No depletion  ?Scapular Bone Region No depletion  ?Dorsal Hand No depletion  ?Patellar Region Mild depletion  ?Anterior Thigh Region Mild depletion  ?Posterior Calf Region Mild depletion  ?Edema (RD Assessment) None  ?Hair Reviewed  ?Eyes Reviewed  ?Mouth Reviewed  ?Skin Reviewed  ?Nails Reviewed  ? ?  ? ? ?Diet Order:   ?Diet Order   ? ?       ?  Diet NPO time specified  Diet effective now       ?  ? ?  ?  ? ?  ? ? ?  EDUCATION NEEDS:  ? ?Not appropriate for education at this time ? ?Skin:  Skin Assessment: Reviewed RN Assessment ? ?Last BM:  09/04/21 ? ?Height:  ? ?Ht Readings from Last 1 Encounters:  ?09/03/21 5\' 6"  (1.676 m)  ? ? ?Weight:  ? ?Wt Readings from Last 1 Encounters:  ?09/03/21 73.5 kg  ? ? ?Ideal Body Weight:  59.1 kg ? ?BMI:  Body mass index is 26.15 kg/m?. ? ?Estimated Nutritional Needs:  ? ?Kcal:  1700-1900 ? ?Protein:  90-105 grams ? ?Fluid:  > 1.7 L ? ? ? ?11/03/21, RD, LDN, CDCES ?Registered Dietitian II ?Certified Diabetes Care and Education Specialist ?Please refer to [facility] for RD and/or RD  on-call/weekend/after hours pager  ?

## 2021-09-04 NOTE — Plan of Care (Signed)

## 2021-09-04 NOTE — TOC Progression Note (Signed)
Transition of Care (TOC) - Progression Note  ? ? ?Patient Details  ?Name: Kaitlyn Ruiz ?MRN: [id number] ?Date of Birth: 1956/05/06 ? ?Transition of Care (TOC) CM/SW Contact  ?Anakaren Campion A Liv Rallis, LCSW ?Phone Number: ?09/04/2021, 11:11 AM ? ?Clinical Narrative:   Pt dc 4/5 to [facility]. Pt is LTC at [facility]. Pt return under observation. Pt will return to [facility] at d/c. [facility] spoke with pt's sister and she would like for hospice to follow up once pt is back at [facility], rather then here.  ? ? ? ?  ?  ? ?Expected Discharge Plan and Services ?  ?  ?  ?  ?  ?                ?  ?  ?  ?  ?  ?  ?  ?  ?  ?  ? ? ?Social Determinants of Health (SDOH) Interventions ?  ? ?Readmission Risk Interventions ? ?  08/14/2021  ? 10:11 AM  ?Readmission Risk Prevention Plan  ?Transportation Screening Complete  ?PCP or Specialist Appt within 3-5 Days Complete  ?HRI or Home Care Consult Complete  ?Social Work Consult for Recovery Care Planning/Counseling Complete  ?Medication Review Oceanographer) Complete  ? ? ?

## 2021-09-04 NOTE — Progress Notes (Signed)
?Progress Note ? ? ?Patient: Kaitlyn Ruiz UEA:[id number] DOB: [birth year] DOA: 09/03/2021     0 ?DOS: the patient was seen and examined on 09/04/2021 ?  ?Brief hospital course: ?Taken from H&P. ? ?Kaitlyn Ruiz is a 66 y.o. female with medical history significant for CVA with residual left hemiparesis, aphasia and dysphagia requiring PEG tube feedings and total care dependent, HFpEF, type 2 diabetes, chronic hypotension on midodrine and Florinef, COPD, discharged from this facility the day prior to presentation after being treated for acute hypoxic respiratory failure secondary to recurrent aspiration pneumonitis.  She presents from SNF due to PEG tube dysfunction.  In the ED, x-ray abdomen reveals gastrotomy catheter likely withdrawn from the gastric lumen and within the gastrotomy catheter tract.  No free extravasation of contrast.  ? ?Patient was hemodynamically stable on arrival to ED.  She was initially admitted under observation so interventional radiology can replace the tube on 09/04/2021. ? ?4/7: PEG tube was replaced successfully and tube feeds were resumed. ?Patient became febrile this morning, patient has an history of chronic aspiration as she is unable to manage her upper respiratory secretions and also concern of GERD. ?No new changes to cough.  Chest x-ray with unchanged left greater than right heterogenous opacities, concerning for infection.  Mild leukocytosis. ?UA was checked and that shows mild leukocytosis. ?Blood and urine culture sent. ?She was given a dose of IV acetaminophen as she was n.p.o. before the PEG tube exchange. ? ?Hospice services also evaluated her and she qualifies for hospice at her facility. ? ?Checking procalcitonin and holding off to antibiotics at this time. ?We will monitor the fever curve. ?If remain afebrile then she can go back to her facility tomorrow. ?If if becomes febrile again-we will start her on Augmentin through PEG tube, she might need prolonged antibiotics due to  recurrent aspirations. ?Need to discuss with family regarding using prolonged antibiotics. ? ? ?Assessment and Plan: ?* Feeding tube dysfunction ?Patient with PEG tube dependency, dysphagia secondary to prior CVA. ?PEG tube was replaced by IR. ?-Restart tube feed ?-Restart home medications ?-Continue to monitor ? ?Sepsis ([place]) ?Patient met sepsis criteria with fever and leukocytosis this morning.  Most likely secondary to pneumonia.  History of aspiration pneumonias. ?Giving some IV fluid. ?Holding antibiotics at this time and monitoring. ?Might need prolonged course of Augmentin due to this recurrent issue. ?Unable to manage her on upper respiratory secretions. ?Also being evaluated by hospice. ? ?HTN (hypertension) ?Patient was placed on midodrine and Florinef during most recent prior hospitalization with septic shock due to hypotension. ?Now blood pressure elevated. ?-Discontinue midodrine ?-Continue with Florinef with dose decrease for now-we will monitor and if blood pressure remained elevated we will discontinue it. ?-Continue to monitor ? ?History of CVA (cerebrovascular accident) ?No new deficit.  Patient with residual left hemiparesis and dysphagia, PEG tube dependent. ?-Continue with aspirin through tube ? ?  ? ?Subjective: Patient was seen after the procedure.  She was quite lethargic as she received sedating medications for her PEG tube replacement procedure with IR. ?She just open eyes when called her name and nodding no when asked about pain. ?He was not following any other commands. ? ?Physical Exam: ?Vitals:  ? 09/04/21 0030 09/04/21 0114 09/04/21 0536 09/04/21 0818  ?BP: 124/61 124/85  138/71  ?Pulse: 90 99 (P) 97 92  ?Resp: (!) 25 17 (P) 17 18  ?Temp: 98.8 ?F (37.1 ?C) 99.7 ?F (37.6 ?C) (P) 99.1 ?F (37.3 ?C) (!) 101 ?F (38.3 ?  C)  ?TempSrc: Axillary     ?SpO2: 100% 92%  100%  ?Weight:      ?Height:      ? ?General.  Lethargic lady, in no acute distress. ?Pulmonary.  Lungs clear bilaterally, normal  respiratory effort. ?CV.  Regular rate and rhythm, no JVD, rub or murmur. ?Abdomen.  Soft, nontender, nondistended, BS positive. ?CNS.  Somnolent, not following any commands. ?Extremities.  No edema, no cyanosis, pulses intact and symmetrical. ?Psychiatry.  Judgment and insight appears impaired ? ?Data Reviewed: ?Prior notes, labs and images reviewed. ? ?Family Communication:  ? ?Disposition: ?Status is: Observation ?The patient remains OBS appropriate and will d/c before 2 midnights. ? Planned Discharge Destination: Skilled nursing facility ? ?DVT prophylaxis.  Lovenox ? ?Time spent: 45 minutes ? ?This record has been created using Systems analyst. Errors have been sought and corrected,but may not always be located. Such creation errors do not reflect on the standard of care. ? ?Author: ?Lorella Nimrod, MD ?09/04/2021 3:52 PM ? ?For on call review [email or web address].  ?

## 2021-09-04 NOTE — Assessment & Plan Note (Addendum)
Patient was placed on midodrine and Florinef during most recent prior hospitalization with septic shock due to hypotension. ?Now blood pressure elevated. ?-Discontinue midodrine ?-Continue with Florinef with dose decrease for now-we will monitor and if blood pressure remained elevated we will discontinue it. ?-Continue to monitor ?

## 2021-09-04 NOTE — Hospital Course (Addendum)
Taken from H&P. ? ?Kaitlyn Ruiz is a 67 y.o. female with medical history significant for CVA with residual left hemiparesis, aphasia and dysphagia requiring PEG tube feedings and total care dependent, HFpEF, type 2 diabetes, chronic hypotension on midodrine and Florinef, COPD, discharged from this facility the day prior to presentation after being treated for acute hypoxic respiratory failure secondary to recurrent aspiration pneumonitis.  She presents from SNF due to PEG tube dysfunction.  In the ED, x-ray abdomen reveals gastrotomy catheter likely withdrawn from the gastric lumen and within the gastrotomy catheter tract.  No free extravasation of contrast.  ? ?Patient was hemodynamically stable on arrival to ED.  She was initially admitted under observation so interventional radiology can replace the tube on 09/04/2021. ? ?4/7: PEG tube was replaced successfully and tube feeds were resumed. ?Patient became febrile this morning, patient has an history of chronic aspiration as she is unable to manage her upper respiratory secretions and also concern of GERD. ?No new changes to cough.  Chest x-ray with unchanged left greater than right heterogenous opacities, concerning for infection.  Mild leukocytosis. ?UA was checked and that shows mild leukocytosis. ?Blood and urine culture sent. ?She was given a dose of IV acetaminophen as she was n.p.o. before the PEG tube exchange. ? ?Hospice services also evaluated her and she qualifies for hospice at her facility. ? ?Procalcitonin started trending up, at 0.53 today, although leukocytosis and fever improved. ?She did had an episode of aspiration yesterday so we decided to start her on Augmentin, due to recurrent aspiration she might get benefit from a suppressive dose after completing a 5-day course with current dosage.  Dosage can be adjusted at SNF. ? ?Patient is now also septic with hospice services, they will follow her at her facility. ?Family need to make decisions  regarding prolonged antibiotics. ? ?She will continue with current management and will follow-up with her providers. ?

## 2021-09-04 NOTE — Progress Notes (Signed)
pt is having a hard time coughing up spit/phlegm; sounds gurgled. Tried oral suctioning, charge nurse called respiratory for possible deep suctioning, Notified np on call; orders received for RT to oraltrach suctioning. Pt tolerated well. Pt is calm.  ?

## 2021-09-04 NOTE — Progress Notes (Addendum)
Civil engineer, contracting [facility]) Hospital Liaison Note ? ?Received request from [facility] staff prior to hospitalization. MSW contacted family sister--[name] Benbow: [phone]. She reports that she would prefer to hold off on hospice evaluation until patient returns to [facility]. [facility] staff/TOC/MD are aware above updates and [facility] to follow up upon discharge. ? ?[place]: 1:47p ? ?Received request from Transitions of Care Manager, Clarisse Gouge, for hospice services at LTC [facility]) after discharge as LTC will not accept patient back at facility with hospice evaluation, family agreed for evaluation to take place. Chart and patient information under review by [facility] physician. Hospice eligibility approved. ?  ?Spoke with Allena Earing: [phone] to initiate education related to hospice philosophy, services, and team approach to care. Fayrene Fearing verbalized understanding of information given. Per discussion, the plan is for patient to discharge to LTC via [facility] once cleared to DC.  ?  ?DME needs discussed. Patient has the following equipment in the home ?(Purchased privately): ?N.a ?Patient requests the following equipment for delivery: ?N/a ? ?Address verified and is correct in the chart.  ? ?Patient is a FULL code and will not require DNR upon D/C. Please provide prescriptions at discharge as needed to ensure ongoing symptom management.  ?  ?[facility] information and contact numbers given to family & above information shared with TOC. ?  ?Please call with any questions/concerns.  ?  ?Thank you for the opportunity to participate in this patient's care. ?  ?Odette Fraction, MSW ?[facility] Hospital Liaison  ?[phone] ? ? ?

## 2021-09-04 NOTE — Assessment & Plan Note (Signed)
Patient with PEG tube dependency, dysphagia secondary to prior CVA. ?PEG tube was replaced by IR. ?-Restart tube feed ?-Restart home medications ?-Continue to monitor ?

## 2021-09-04 NOTE — Procedures (Addendum)
18 Fr balloon retention G-tube noted to be retracted with tip likely in gastrostomy tract. ? ?G-tube remains in place on exam, retention balloon only noted to have 2 cc saline upon deflation. G-tube tip was advanced ~3 cm and balloon inflated with 10 cc sterile saline and pulled against gastric lumen without difficulty. Unable to aspirate gastric contents although patient has not had tube feeds > 24 hours.  ? ?Will repeat KUB with contrast to confirm tip within gastric lumen prior to use. ? ?Recommend abdominal binder at all times and not placing any guaze between skin and retention disc to prevent further dislodgements.  ? ?Candiss Norse, PA-C ?

## 2021-09-04 NOTE — Assessment & Plan Note (Signed)
Patient met sepsis criteria with fever and leukocytosis this morning.  Most likely secondary to pneumonia.  History of aspiration pneumonias. ?Giving some IV fluid. ?Holding antibiotics at this time and monitoring. ?Might need prolonged course of Augmentin due to this recurrent issue. ?Unable to manage her on upper respiratory secretions. ?Also being evaluated by hospice. ?

## 2021-09-04 NOTE — Plan of Care (Signed)

## 2021-09-04 NOTE — Assessment & Plan Note (Signed)
No new deficit.  Patient with residual left hemiparesis and dysphagia, PEG tube dependent. ?-Continue with aspirin through tube ?

## 2021-09-04 NOTE — Progress Notes (Signed)
Called by RN to NTS patient. Pt not very tolerate to suctioning. Assisted by RN X2. Unable to safely pass catheter via right or left nare without possibility of trauma. Pt had several frothy oral secretions. Pt did not tolerate procedure well. Pt is calm post NTS, O2 replaced by RN.  ?

## 2021-09-05 DIAGNOSIS — A419 Sepsis, unspecified organism: Secondary | ICD-10-CM | POA: Diagnosis not present

## 2021-09-05 DIAGNOSIS — R6520 Severe sepsis without septic shock: Secondary | ICD-10-CM | POA: Diagnosis not present

## 2021-09-05 DIAGNOSIS — J9600 Acute respiratory failure, unspecified whether with hypoxia or hypercapnia: Secondary | ICD-10-CM | POA: Diagnosis not present

## 2021-09-05 DIAGNOSIS — T85598A Other mechanical complication of other gastrointestinal prosthetic devices, implants and grafts, initial encounter: Secondary | ICD-10-CM | POA: Diagnosis not present

## 2021-09-05 LAB — URINE CULTURE: Culture: NO GROWTH

## 2021-09-05 LAB — CBC WITH DIFFERENTIAL/PLATELET
Abs Immature Granulocytes: 0.01 10*3/uL (ref 0.00–0.07)
Basophils Absolute: 0 10*3/uL (ref 0.0–0.1)
Basophils Relative: 0 %
Eosinophils Absolute: 0.2 10*3/uL (ref 0.0–0.5)
Eosinophils Relative: 3 %
HCT: 32.9 % — ABNORMAL LOW (ref 36.0–46.0)
Hemoglobin: 10.3 g/dL — ABNORMAL LOW (ref 12.0–15.0)
Immature Granulocytes: 0 %
Lymphocytes Relative: 36 %
Lymphs Abs: 2.5 10*3/uL (ref 0.7–4.0)
MCH: 29.6 pg (ref 26.0–34.0)
MCHC: 31.3 g/dL (ref 30.0–36.0)
MCV: 94.5 fL (ref 80.0–100.0)
Monocytes Absolute: 0.4 10*3/uL (ref 0.1–1.0)
Monocytes Relative: 6 %
Neutro Abs: 3.7 10*3/uL (ref 1.7–7.7)
Neutrophils Relative %: 55 %
Platelets: 179 10*3/uL (ref 150–400)
RBC: 3.48 MIL/uL — ABNORMAL LOW (ref 3.87–5.11)
RDW: 14.3 % (ref 11.5–15.5)
WBC: 6.8 10*3/uL (ref 4.0–10.5)
nRBC: 0 % (ref 0.0–0.2)

## 2021-09-05 LAB — GLUCOSE, CAPILLARY
Glucose-Capillary: 108 mg/dL — ABNORMAL HIGH (ref 70–99)
Glucose-Capillary: 108 mg/dL — ABNORMAL HIGH (ref 70–99)
Glucose-Capillary: 77 mg/dL (ref 70–99)
Glucose-Capillary: 93 mg/dL (ref 70–99)

## 2021-09-05 LAB — PROCALCITONIN: Procalcitonin: 0.53 ng/mL

## 2021-09-05 MED ORDER — AMOXICILLIN-POT CLAVULANATE 400-57 MG/5ML PO SUSR: 875.0000 mg | Freq: Two times a day (BID) | ORAL | 0 refills | Status: DC

## 2021-09-05 MED ORDER — FLUDROCORTISONE ACETATE 0.1 MG PO TABS
0.0500 mg | ORAL_TABLET | Freq: Every day | ORAL | Status: DC
Start: 2021-09-05 — End: 2023-04-06

## 2021-09-05 MED ORDER — AMOXICILLIN-POT CLAVULANATE 400-57 MG/5ML PO SUSR
875.0000 mg | Freq: Two times a day (BID) | ORAL | Status: DC
  Administered 2021-09-05: 875 mg
  Filled 2021-09-05: qty 10.94

## 2021-09-05 NOTE — Plan of Care (Signed)

## 2021-09-05 NOTE — Progress Notes (Signed)
Attempted to contact [facility] twice. Not successful at both time.  ?

## 2021-09-05 NOTE — Progress Notes (Signed)
Contacted and Reported to facility nurse, Juliette Alcide. Discharged back to [facility]. EMS transported pt from [facility] to LTC.  ?

## 2021-09-05 NOTE — TOC Transition Note (Addendum)
Transition of Care (TOC) - CM/SW Discharge Note ? ? ?Patient Details  ?Name: Kaitlyn Ruiz ?MRN: [id number] ?Date of Birth: 06/08/1955 ? ?Transition of Care (TOC) CM/SW Contact:  ?Izola Price, RN ?Phone Number: ?09/05/2021, 10:36 AM ? ? ?Clinical Narrative: 09/05/21: Patient is pending discharge back to [facility] from where patient was admitted from this admission/observation. FL2 sent with CSW referral 09/04/21 per prior CM. Contacted facility today by this RN CM at 1030, confirmed return status, to room B 4 as before.  ?Report call to [place] at [phone]. ?Sister, Kaitlyn Ruiz, at [phone], notified of pending discharge/transfer to [facility] today. Will inbox and fax DC Summary when completed to [phone].  ?Updated Unit care nurse via secure chat.  ?Will arrange for [facility] transport when patient discharge in complete and patient readied. Simmie Davies RN CM [phone] ? ?Update: Unit nurse has been unable to reach number given for report by main number contact this am. Facility knows returning patient, facility has been notified and acknowledged patient's return today, 09/05/21. DC Summary has been inboxed and faxed to number given. [phone]) Will transport when ready. Sister notified earlier as well. EMS forms printed to unit. [facility] will be contacted for non-emergency transport. Simmie Davies RN CM  ? ?Final: Unit RN/LPN was able to contact person for handoff/report at [place]. [facility] arranged for around 3 pm today. Simmie Davies RN CM  ? ?Final next level of care: [place] ?Barriers to Discharge: Barriers Resolved ? ? ?Patient Goals and CMS Choice ?Patient states their goals for this hospitalization and ongoing recovery are:: [facility] ?  ?Choice offered to / list presented to :  (Returning after being in observation status.) ? ?Discharge Placement ?  ?           ?Patient chooses bed at:  ([facility] [place]) ?  ?Name of family  member notified: Kaitlyn Ruiz ?Patient and family notified of of transfer: 09/05/21 (1030 am.) ? ?Discharge Plan and Services ?  ?  ?           ?DME Arranged: N/A ?DME Agency: NA ?  ?  ?  ?HH Arranged: NA ?[place] Agency: NA ?  ?  ?  ? ?Social Determinants of Health (SDOH) Interventions ?  ? ? ?Readmission Risk Interventions ? ?  08/14/2021  ? 10:11 AM  ?Readmission Risk Prevention Plan  ?Transportation Screening Complete  ?PCP or Specialist Appt within 3-5 Days Complete  ?[place] or Home Care Consult Complete  ?Social Work Consult for [place] Planning/Counseling Complete  ?Medication Review Press photographer) Complete  ? ? ? ? ? ?

## 2021-09-05 NOTE — Discharge Summary (Signed)
?Physician Discharge Summary ?  ?Patient: Kaitlyn Ruiz MRN: [id number] DOB: 1955-08-01  ?Admit date:     09/03/2021  ?Discharge date: 09/05/21  ?Discharge Physician: Lorella Nimrod  ? ?PCP: Marco Collie, MD  ? ?Recommendations at discharge:  ?Please obtain CBC and BMP in 1 week ?Patient is being discharged on Augmentin for concern of aspiration pneumonia. ?Please evaluate her for suppressive doses due to recurrent aspiration after completing current 5-day course. ?Please discuss with family and hospice services regarding prolonged antibiotic use. ?She was accepted under hospice services and they will follow at the facility. ? ?Discharge Diagnoses: ?Principal Problem: ?  Feeding tube dysfunction ?Active Problems: ?  Sepsis ([place]) ?  HTN (hypertension) ?  History of CVA (cerebrovascular accident) ? ?Hospital Course: ?Taken from H&P. ? ?Kaitlyn Ruiz is a 66 y.o. female with medical history significant for CVA with residual left hemiparesis, aphasia and dysphagia requiring PEG tube feedings and total care dependent, HFpEF, type 2 diabetes, chronic hypotension on midodrine and Florinef, COPD, discharged from this facility the day prior to presentation after being treated for acute hypoxic respiratory failure secondary to recurrent aspiration pneumonitis.  She presents from SNF due to PEG tube dysfunction.  In the ED, x-ray abdomen reveals gastrotomy catheter likely withdrawn from the gastric lumen and within the gastrotomy catheter tract.  No free extravasation of contrast.  ? ?Patient was hemodynamically stable on arrival to ED.  She was initially admitted under observation so interventional radiology can replace the tube on 09/04/2021. ? ?4/7: PEG tube was replaced successfully and tube feeds were resumed. ?Patient became febrile this morning, patient has an history of chronic aspiration as she is unable to manage her upper respiratory secretions and also concern of GERD. ?No new changes to cough.  Chest x-ray with  unchanged left greater than right heterogenous opacities, concerning for infection.  Mild leukocytosis. ?UA was checked and that shows mild leukocytosis. ?Blood and urine culture sent. ?She was given a dose of IV acetaminophen as she was n.p.o. before the PEG tube exchange. ? ?Hospice services also evaluated her and she qualifies for hospice at her facility. ? ?Procalcitonin started trending up, at 0.53 today, although leukocytosis and fever improved. ?She did had an episode of aspiration yesterday so we decided to start her on Augmentin, due to recurrent aspiration she might get benefit from a suppressive dose after completing a 5-day course with current dosage.  Dosage can be adjusted at SNF. ? ?Patient is now also septic with hospice services, they will follow her at her facility. ?Family need to make decisions regarding prolonged antibiotics. ? ?She will continue with current management and will follow-up with her providers. ? ?Assessment and Plan: ?* Feeding tube dysfunction ?Patient with PEG tube dependency, dysphagia secondary to prior CVA. ?PEG tube was replaced by IR. ?-Restart tube feed ?-Restart home medications ?-Continue to monitor ? ?Sepsis ([place]) ?Patient met sepsis criteria with fever and leukocytosis this morning.  Most likely secondary to pneumonia.  History of aspiration pneumonias. ?Giving some IV fluid. ?Holding antibiotics at this time and monitoring. ?Might need prolonged course of Augmentin due to this recurrent issue. ?Unable to manage her on upper respiratory secretions. ?Also being evaluated by hospice. ? ?HTN (hypertension) ?Patient was placed on midodrine and Florinef during most recent prior hospitalization with septic shock due to hypotension. ?Now blood pressure elevated. ?-Discontinue midodrine ?-Continue with Florinef with dose decrease for now-we will monitor and if blood pressure remained elevated we will discontinue it. ?-Continue to monitor ? ?  History of CVA (cerebrovascular  accident) ?No new deficit.  Patient with residual left hemiparesis and dysphagia, PEG tube dependent. ?-Continue with aspirin through tube ? ? ? ? ?Consultants: IR ?Procedures performed: PEG tube replacement ?Disposition: Skilled nursing facility ?Diet recommendation:  ?NPO with tube feed ? ?DISCHARGE MEDICATION: ?Allergies as of 09/05/2021   ?No Known Allergies ?  ? ?  ?Medication List  ?  ? ?STOP taking these medications   ? ?midodrine 5 MG tablet ?Commonly known as: PROAMATINE ?  ? ?  ? ?TAKE these medications   ? ?Acidophilus 100 MG Caps ?Place 1 capsule into feeding tube 3 (three) times daily. ?  ?albuterol 108 (90 Base) MCG/ACT inhaler ?Commonly known as: VENTOLIN HFA ?Inhale 2 puffs into the lungs every 4 (four) hours as needed for wheezing or shortness of breath. ?  ?amoxicillin-clavulanate 400-57 MG/5ML suspension ?Commonly known as: AUGMENTIN ?Place 10.9 mLs (875 mg total) into feeding tube every 12 (twelve) hours. ?  ?aspirin 81 MG chewable tablet ?Place 1 tablet (81 mg total) into feeding tube daily. ?  ?atropine 1 % ophthalmic solution ?Place 2 drops under the tongue every 4 (four) hours as needed. ?  ?budesonide 0.25 MG/2ML nebulizer solution ?Commonly known as: PULMICORT ?Take 2 mLs (0.25 mg total) by nebulization 2 (two) times daily. ?  ?citalopram 10 MG tablet ?Commonly known as: CELEXA ?Place 10 mg into feeding tube daily. Vie PEG-Tube ?  ?docusate 50 MG/5ML liquid ?Commonly known as: COLACE ?Place 10 mLs (100 mg total) into feeding tube 2 (two) times daily. ?  ?feeding supplement (GLUCERNA 1.5 CAL) Liqd ?Place 1,000 mLs into feeding tube continuous. ?  ?feeding supplement (PROSource TF) liquid ?Place 45 mLs into feeding tube daily. ?  ?fludrocortisone 0.1 MG tablet ?Commonly known as: FLORINEF ?Place 0.5 tablets (0.05 mg total) into feeding tube daily. ?What changed: how much to take ?  ?free water Soln ?Place 150 mLs into feeding tube every 6 (six) hours. ?  ?gabapentin 250 MG/5ML  solution ?Commonly known as: NEURONTIN ?Place 6 mLs (300 mg total) into feeding tube every 8 (eight) hours. ?  ?insulin aspart 100 UNIT/ML injection ?Commonly known as: novoLOG ?Inject 3-9 Units into the skin every 4 (four) hours. ?What changed:  ?how much to take ?when to take this ?additional instructions ?  ?ipratropium-albuterol 0.5-2.5 (3) MG/3ML Soln ?Commonly known as: DUONEB ?Take 3 mLs by nebulization 2 (two) times daily as needed. ?  ?liver oil-zinc oxide 40 % ointment ?Commonly known as: DESITIN ?Apply topically as needed for irritation. ?  ?loperamide HCl 2 MG/15ML solution ?Commonly known as: IMODIUM ?Place 15 mLs (2 mg total) into feeding tube 4 (four) times daily as needed for diarrhea or loose stools. ?  ?mouth rinse Liqd solution ?15 mLs by Mouth Rinse route 2 times daily at 12 noon and 4 pm. ?What changed: when to take this ?  ?polyethylene glycol 17 g packet ?Commonly known as: MIRALAX / GLYCOLAX ?Place 17 g into feeding tube daily as needed for moderate constipation. ?  ?pravastatin 20 MG tablet ?Commonly known as: PRAVACHOL ?Place 20 mg into feeding tube at bedtime. Via PEG-Tube ?  ?valproic acid 250 MG/5ML solution ?Commonly known as: DEPAKENE ?Place 2.5 mLs (125 mg total) into feeding tube 2 (two) times daily. ?  ?Vitamin D3 1.25 MG (50000 UT) Caps ?Take 50,000 Units by mouth. Take every 56 days. ?  ? ?  ? ? Follow-up Information   ? ? Marco Collie, MD. Schedule an appointment as soon  as possible for a visit in 1 week(s).   ?Specialty: Family Medicine ?Contact information: ?[address] ?[address] ?[place] [address] ?[phone] ? ? ?  ?  ? ?  ?  ? ?  ? ?Discharge Exam: ?Filed Weights  ? 09/03/21 2123 09/05/21 0500  ?Weight: 73.5 kg 69.3 kg  ? ?General.  Chronically ill-appearing, aphasic lady, in no acute distress. ?Pulmonary.  Lungs clear bilaterally, normal respiratory effort. ?CV.  Regular rate and rhythm, no JVD, rub or murmur. ?Abdomen.  Soft, nontender, nondistended, BS  positive.  PEG tube in place ?CNS.  Awake, left hemiparesis ?Extremities.  No edema, no cyanosis, pulses intact and symmetrical. ?Psychiatry.  Judgment and insight appears impaired ? ?Condition at discharge: stable ? ?The results

## 2021-09-09 LAB — CULTURE, BLOOD (ROUTINE X 2)
Culture: NO GROWTH
Culture: NO GROWTH
Special Requests: ADEQUATE
Special Requests: ADEQUATE

## 2021-09-10 ENCOUNTER — Other Ambulatory Visit: Payer: Self-pay

## 2021-09-10 ENCOUNTER — Emergency Department

## 2021-09-10 ENCOUNTER — Emergency Department
Admission: EM | Admit: 2021-09-10 | Discharge: 2021-09-10 | Disposition: A | Discharge status: Skilled Nursing Facility | Attending: Emergency Medicine | Admitting: Emergency Medicine

## 2021-09-10 DIAGNOSIS — T85528A Displacement of other gastrointestinal prosthetic devices, implants and grafts, initial encounter: Secondary | ICD-10-CM

## 2021-09-10 DIAGNOSIS — Z431 Encounter for attention to gastrostomy: Secondary | ICD-10-CM | POA: Diagnosis not present

## 2021-09-10 MED ORDER — DIATRIZOATE MEGLUMINE & SODIUM 66-10 % PO SOLN
30.0000 mL | Freq: Once | ORAL | Status: AC
Start: 2021-09-10 — End: 2021-09-10
  Administered 2021-09-10: 30 mL

## 2021-09-10 NOTE — ED Triage Notes (Signed)
Pt presents to ED from [facility] after displacing G tube.  ? ? ?97.6 ? ?92% 4L  ?79 ?115/53 ?CBG 91 ?

## 2021-09-10 NOTE — ED Notes (Signed)
Dressing paced around g tube, no drainage note. Xray in room to verify placement.  ?

## 2021-09-10 NOTE — ED Provider Notes (Signed)
? ?  [facility] ?Provider Note ? ? ? Event Date/Time  ? First MD Initiated Contact with Patient 09/10/21 1545   ?  (approximate) ? ? ?History  ? ?g tube displaced ? ? ?HPI ? ?Kaitlyn Ruiz is a 66 y.o. female with history of esophageal dysphagia following CVA and PEG tube insertion and as listed in EMR presents to the emergency department for replacement of PEG tube.  Tube was accidentally displaced.  She resides at [facility]  They were able to insert a Foley catheter to keep the area open until a new G-tube could be in place. ? ?  ? ? ?Physical Exam  ? ?Triage Vital Signs: ?ED Triage Vitals  ?Enc Vitals Group  ?   BP 09/10/21 1550 124/62  ?   Pulse Rate 09/10/21 1550 72  ?   Resp 09/10/21 1550 16  ?   Temp 09/10/21 1550 99.3 ?F (37.4 ?C)  ?   Temp Source 09/10/21 1550 Oral  ?   SpO2 09/10/21 1550 97 %  ?   Weight 09/10/21 1552 152 lb (68.9 kg)  ?   Height 09/10/21 1552 5\' 6"  (1.676 m)  ?   Head Circumference --   ?   Peak Flow --   ?   Pain Score 09/10/21 1550 0  ?   Pain Loc --   ?   Pain Edu? --   ?   Excl. in GC? --   ? ? ?Most recent vital signs: ?Vitals:  ? 09/10/21 1550 09/10/21 1627  ?BP: 124/62 122/65  ?Pulse: 72 72  ?Resp: 16 18  ?Temp: 99.3 ?F (37.4 ?C)   ?SpO2: 97% 97%  ? ? ?General: Awake, no distress.  ?CV:  Good peripheral perfusion. ?Resp:  Normal effort.  ?Abd:  No distention.  ?Other:  Foley cath in place of g tube. ? ? ?ED Results / Procedures / Treatments  ? ?Labs ?(all labs ordered are listed, but only abnormal results are displayed) ?Labs Reviewed - No data to display ? ? ?EKG ? ?Not indicated. ? ? ?RADIOLOGY ? ?Image and radiology report reviewed by me. ? ?DG abdomen for PEG tube confirmation shows gastrostomy tube in the stomach. ? ?PROCEDURES: ? ?Critical Care performed: No ? ?Procedures ? ? ?MEDICATIONS ORDERED IN ED: ?Medications  ?diatrizoate meglumine-sodium (GASTROGRAFIN) 66-10 % solution 30 mL (30 mLs Per Tube Given 09/10/21 1610)  ? ? ? ?IMPRESSION / MDM /  ASSESSMENT AND PLAN / ED COURSE  ? ?I have reviewed the triage note. ? ?Differential diagnosis includes, but is not limited to G-tube displacement, G-tube malfunction, G-tube site closure ? ?66 year old female presenting to the emergency department after her G-tube was accidentally dislodged.  Chart review shows that she had previously had an 59 15 tube. ? ?23 15 G-tube easily reinserted into the site and confirmation was obtained with contrasted abdominal image.  Patient discharged home. ? ?  ? ? ?FINAL CLINICAL IMPRESSION(S) / ED DIAGNOSES  ? ?Final diagnoses:  ?Dislodged gastrostomy tube  ? ? ? ?Rx / DC Orders  ? ?ED Discharge Orders   ? ? None  ? ?  ? ? ? ?Note:  This document was prepared using Dragon voice recognition software and may include unintentional dictation errors. ?  ?[place], FNP ?09/10/21 1929 ? ?  ?09/12/21, MD ?09/11/21 [phone] ? ?

## 2022-03-27 ENCOUNTER — Emergency Department
Admission: EM | Admit: 2022-03-27 | Discharge: 2022-03-27 | Disposition: A | Discharge status: Rehabilitation Facility | Attending: Emergency Medicine | Admitting: Emergency Medicine

## 2022-03-27 ENCOUNTER — Emergency Department

## 2022-03-27 ENCOUNTER — Other Ambulatory Visit: Payer: Self-pay

## 2022-03-27 DIAGNOSIS — I10 Essential (primary) hypertension: Secondary | ICD-10-CM | POA: Diagnosis not present

## 2022-03-27 DIAGNOSIS — E119 Type 2 diabetes mellitus without complications: Secondary | ICD-10-CM | POA: Insufficient documentation

## 2022-03-27 DIAGNOSIS — J449 Chronic obstructive pulmonary disease, unspecified: Secondary | ICD-10-CM | POA: Insufficient documentation

## 2022-03-27 DIAGNOSIS — K9423 Gastrostomy malfunction: Secondary | ICD-10-CM | POA: Diagnosis present

## 2022-03-27 MED ORDER — DIATRIZOATE MEGLUMINE & SODIUM 66-10 % PO SOLN: 10.0000 mL | Freq: Once | ORAL | Status: DC
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
## 2022-03-27 NOTE — Discharge Instructions (Signed)
A new 18 French PEG tube was placed.

## 2022-03-27 NOTE — ED Triage Notes (Signed)
Per EMT report, staff noticed patient pulled out feeding tube approximately one hour ago. Staff reports patient is at baseline and frequently pulls out her tube.

## 2022-03-27 NOTE — ED Notes (Signed)
Dr. Starleen Blue at bedside to re-insert feeding tube.

## 2022-03-27 NOTE — ED Notes (Addendum)
X-ray at bedside. Split 4x4 gauze placed over feeding tube. Patient's gown, brief and linens were changed, and skin was cleaned.

## 2022-03-27 NOTE — ED Notes (Signed)
Patient is lying on stretcher. Patient appears comfortable, nodded her head when asked if she was doing OK. Secretary was given paperwork to call EMS.

## 2022-05-02 ENCOUNTER — Other Ambulatory Visit: Payer: Self-pay

## 2022-05-02 ENCOUNTER — Emergency Department
Admission: EM | Admit: 2022-05-02 | Discharge: 2022-05-02 | Disposition: A | Discharge status: Skilled Nursing Facility | Payer: Medicare Other | Attending: Emergency Medicine | Admitting: Emergency Medicine

## 2022-05-02 DIAGNOSIS — K9420 Gastrostomy complication, unspecified: Secondary | ICD-10-CM | POA: Insufficient documentation

## 2022-05-02 DIAGNOSIS — T85528A Displacement of other gastrointestinal prosthetic devices, implants and grafts, initial encounter: Secondary | ICD-10-CM

## 2022-05-02 NOTE — ED Provider Notes (Signed)
   [facility] Provider Note    Event Date/Time   First MD Initiated Contact with Patient 05/02/22 1727     (approximate)   History   G Tube  (G tube fell out)   HPI  Kaitlyn Ruiz is a 66 y.o. female  Who presents to the emergency department for G-tube dislodgment.  Patient is nonverbal at baseline.  Multiple ED visits in the past for G-tube being dislodged.  On chart review patient had 26 French PEG tube in the past.     Physical Exam   Most recent vital signs: Vitals:   05/02/22 1734 05/02/22 2000  BP: 139/75 (!) 150/90  Pulse:  80  Resp:  16  Temp:  98 F (36.7 C)  SpO2:  98%    Physical Exam Constitutional:      Appearance: She is well-developed.  HENT:     Head: Atraumatic.  Eyes:     Conjunctiva/sclera: Conjunctivae normal.  Cardiovascular:     Rate and Rhythm: Regular rhythm.  Pulmonary:     Effort: No respiratory distress.  Abdominal:     General: There is no distension.     Comments: G-tube dislodged  Musculoskeletal:        General: Normal range of motion.     Cervical back: Normal range of motion.  Skin:    General: Skin is warm.  Neurological:     Mental Status: She is alert. Mental status is at baseline.          IMPRESSION / MDM / ASSESSMENT AND PLAN / ED COURSE  I reviewed the triage vital signs and the nursing notes.  On chart review of outside records patient has had multiple visits for dislodgment of G-tube.  61 French G-tube placed in the past without difficulties  Labs (all labs ordered are listed, but only abnormal results are displayed) Labs interpreted as -    Labs Reviewed - No data to display       PROCEDURES:  Critical Care performed: No  Gastrostomy tube replacement  Date/Time: 05/02/2022 9:11 PM  Performed by: Corena Herter, MD Authorized by: Corena Herter, MD  Consent: Verbal consent obtained. Risks and benefits: risks, benefits and alternatives were discussed Consent given  by: patient Patient understanding: patient states understanding of the procedure being performed Patient consent: the patient's understanding of the procedure matches consent given Procedure consent: procedure consent matches procedure scheduled Relevant documents: relevant documents present and verified Required items: required blood products, implants, devices, and special equipment available Patient identity confirmed: verbally with patient Preparation: Patient was prepped and draped in the usual sterile fashion. Local anesthesia used: no  Anesthesia: Local anesthesia used: no  Sedation: Patient sedated: no  Patient tolerance: patient tolerated the procedure well with no immediate complications Comments: 18 Fr gastric tube     [name] is most consistent with acute illness / injury with system symptoms.   MEDICATIONS ORDERED IN ED: Medications - No data to display  FINAL CLINICAL IMPRESSION(S) / ED DIAGNOSES   Final diagnoses:  Gastrojejunostomy tube dislodgement     Rx / DC Orders   ED Discharge Orders     None        Note:  This document was prepared using Dragon voice recognition software and may include unintentional dictation errors.   Corena Herter, MD 05/02/22 2112

## 2022-05-02 NOTE — ED Notes (Signed)
GTube replaced by Dr Augustin Schooling without difficult. Stomach contents confirmed. Secured in place with 20cc NS to balloon and in place

## 2022-05-02 NOTE — ED Triage Notes (Signed)
BIB EMS from [facility] for Gtube pulled out for unknown amount of time. She was fed breakfast this morning. And staff went into room and found it pulled out. EMS unable to give more info as they did not assess mental status during transport.  Vitals WNL for EMS.   Upon arrival to ED pt is alert and oriented to person, place and day. Pt is calm and in acute distress at this time

## 2022-05-02 NOTE — ED Notes (Signed)
[facility] called for transport to [facility]/rep:madelyn

## 2022-05-05 ENCOUNTER — Non-Acute Institutional Stay: Payer: Commercial Managed Care - HMO | Admitting: Nurse Practitioner

## 2022-05-05 ENCOUNTER — Encounter: Payer: Self-pay | Admitting: Nurse Practitioner

## 2022-05-05 DIAGNOSIS — J449 Chronic obstructive pulmonary disease, unspecified: Secondary | ICD-10-CM

## 2022-05-05 DIAGNOSIS — Z515 Encounter for palliative care: Secondary | ICD-10-CM

## 2022-05-05 DIAGNOSIS — R0602 Shortness of breath: Secondary | ICD-10-CM
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
## 2022-05-31 ENCOUNTER — Other Ambulatory Visit: Payer: Self-pay

## 2022-05-31 DIAGNOSIS — K9423 Gastrostomy malfunction: Secondary | ICD-10-CM | POA: Diagnosis present

## 2022-05-31 NOTE — ED Triage Notes (Signed)
Pt in from [facility] via EMS after PEG tube pt pulled out per the staff. Tube tip appears to be in place, but majority of tube is out. Pt tearful, heavy phlegm and sputum secretions in mouth, but cleared upon cough. Answers all questions but situation, baseline per staff. VSS in triage

## 2022-06-01 ENCOUNTER — Emergency Department
Admission: EM | Admit: 2022-06-01 | Discharge: 2022-06-01 | Disposition: A | Discharge status: Home / Self Care | Payer: Commercial Managed Care - HMO | Attending: Emergency Medicine | Admitting: Emergency Medicine

## 2022-06-01 ENCOUNTER — Emergency Department: Payer: Commercial Managed Care - HMO

## 2022-06-01 DIAGNOSIS — T85528A Displacement of other gastrointestinal prosthetic devices, implants and grafts, initial encounter: Secondary | ICD-10-CM

## 2022-06-01 DIAGNOSIS — K9423 Gastrostomy malfunction: Secondary | ICD-10-CM | POA: Diagnosis not present

## 2022-06-01 MED ORDER — DIATRIZOATE MEGLUMINE & SODIUM 66-10 % PO SOLN
20.0000 mL | Freq: Once | ORAL | Status: AC
  Administered 2022-06-01: 20 mL

## 2022-06-01 NOTE — ED Notes (Signed)
Awaiting xray

## 2022-06-01 NOTE — Discharge Instructions (Addendum)
Thank you for choosing us for your health care today!  Please see your primary doctor this week for a follow up appointment.   Sometimes, in the early stages of certain disease courses it is difficult to detect in the emergency department evaluation -- so, it is important that you continue to monitor your symptoms and call your doctor right away or return to the emergency department if you develop any new or worsening symptoms.  Please go to the following website to schedule new (and existing) patient appointments:   [email or web address]  If you do not have a primary doctor try calling the following clinics to establish care:  If you have insurance:  [facility] [phone] [address] [place] [address]   [facility]  [phone] [address] [place] [address] [place] [address]   If you do not have insurance:  [facility]  [phone] [address] [place] [address]   The following is another list of primary care offices in the area who are accepting new patients at this time.  Please reach out to one of them directly and let them know you would like to schedule an appointment to follow up on an Emergency Department visit, and/or to establish a new primary care provider (PCP).  There are likely other primary care clinics in the are who are accepting new patients, but this is an excellent place to start:  [facility] Lead physician: Dr Angela Bacigalupo [address] [place], [place] [address] [phone]  [facility] Lead Physician: Dr Krichna Sowles [address], [place], [place] [address] [phone]  [facility]  Lead Physician: Dr Megan Johnson [address], [place], [place] [address] [phone]  [facility] [place] [facility] Lead Physician: Dr Alex Karamalegos [address], [place], [place] [address] [phone]  [facility] [place] Lead Physician: Dr Laura Berglund [address], [place], [place] [address] [phone]   It was my pleasure to care for you today.   Isaac Dubie S. Josemanuel Eakins, MD  

## 2022-06-01 NOTE — ED Provider Notes (Signed)
[facility] Provider Note    Event Date/Time   First MD Initiated Contact with Patient 06/01/22 0050     (approximate)   History   Feeding tube came out   HPI  Kaitlyn Ruiz is a 67 y.o. female   Past medical history of CVA bedbound nonverbal with G-tube who presents with G-tube dislodgment.  From facility.  Facility reports otherwise mentation is baseline no other acute medical complaints.    History was obtained via limited by patient due to her baseline mental status, reports from facility as above.  Reviewed external medical notes including a December 2023 emergency department visit for G-tube displacement which was replaced without complication, noted at that time documents frequent displacement of G-tube.      Physical Exam   Triage Vital Signs: ED Triage Vitals  Enc Vitals Group     BP 05/31/22 2332 (!) 151/78     Pulse Rate 05/31/22 2332 83     Resp 05/31/22 2332 18     Temp 05/31/22 2332 98.4 F (36.9 C)     Temp Source 05/31/22 2332 Axillary     SpO2 05/31/22 2332 97 %     Weight 05/31/22 2333 249 lb 1.9 oz (113 kg)     Height --      Head Circumference --      Peak Flow --      Pain Score --      Pain Loc --      Pain Edu? --      Excl. in [place]? --     Most recent vital signs: Vitals:   06/01/22 0200 06/01/22 0345  BP: 133/72 129/73  Pulse: 96 91  Resp: 17 19  Temp:    SpO2: 95% 96%    General: Awake, no distress.  CV:  Good peripheral perfusion.  Resp:  Normal effort.  Abd:  No distention.  Other:  Awake alert, no distress, hemodynamics appropriate reassuring, balloon is just inflated on G-tube but is intact when I tested 14 [place] site appears clean dry and intact, replaced without complication see procedure note   ED Results / Procedures / Treatments   Labs (all labs ordered are listed, but only abnormal results are displayed) Labs Reviewed - No data to display  RADIOLOGY I independently reviewed and  interpreted abdominal x-ray and see appropriate placement of G-tube   PROCEDURES:  Critical Care performed: No  Gastrostomy tube replacement  Date/Time: 06/01/2022 1:02 AM  Performed by: Lucillie Garfinkel, MD Authorized by: Lucillie Garfinkel, MD  Relevant documents: relevant documents present and verified Patient identity confirmed: arm band Time out: Immediately prior to procedure a "time out" was called to verify the correct patient, procedure, equipment, support staff and site/side marked as required. Preparation: Patient was prepped and draped in the usual sterile fashion. Local anesthesia used: no  Anesthesia: Local anesthesia used: no  Sedation: Patient sedated: no  Patient tolerance: patient tolerated the procedure well with no immediate complications      MEDICATIONS ORDERED IN ED: Medications  diatrizoate meglumine-sodium (GASTROGRAFIN) 66-10 % solution 20 mL (20 mLs Per Tube Given 06/01/22 0344)     IMPRESSION / MDM / ASSESSMENT AND PLAN / ED COURSE  I reviewed the triage vital signs and the nursing notes.                              Differential diagnosis includes, but is not  limited to, G-tube dislodgment, considered other acute emergent medical complaints but with no reports and otherwise patient in baseline status, doubtful.   MDM: Tube was replaced as above, mature tract, imaging ordered and will be reviewed prior to discharge.   [name] is most consistent with acute presentation with potential threat to life or bodily function.       FINAL CLINICAL IMPRESSION(S) / ED DIAGNOSES   Final diagnoses:  Dislodged gastrostomy tube     Rx / DC Orders   ED Discharge Orders     None        Note:  This document was prepared using Dragon voice recognition software and may include unintentional dictation errors.    Lucillie Garfinkel, MD 06/01/22 [phone]

## 2022-06-01 NOTE — ED Notes (Signed)
Patient drooling and unable to cough up secretions effectively, pt suctioned at bedside

## 2022-06-01 NOTE — ED Notes (Signed)
Patient transported to x-ray. ?

## 2022-06-03 ENCOUNTER — Other Ambulatory Visit: Payer: Self-pay | Admitting: Gerontology

## 2022-06-03 DIAGNOSIS — K9423 Gastrostomy malfunction: Secondary | ICD-10-CM

## 2022-06-04 ENCOUNTER — Other Ambulatory Visit: Payer: Self-pay | Admitting: Gerontology

## 2022-06-04 ENCOUNTER — Ambulatory Visit
Admission: RE | Admit: 2022-06-04 | Discharge: 2022-06-04 | Disposition: A | Discharge status: Home / Self Care | Payer: Medicare Other | Source: Ambulatory Visit | Attending: Gerontology | Admitting: Gerontology

## 2022-06-04 DIAGNOSIS — K9423 Gastrostomy malfunction: Secondary | ICD-10-CM | POA: Insufficient documentation

## 2022-06-04 HISTORY — PX: IR CM INJ ANY COLONIC TUBE W/FLUORO: IMG2336

## 2022-06-04 MED ORDER — IOHEXOL 300 MG/ML  SOLN
50.0000 mL | Freq: Once | INTRAMUSCULAR | Status: AC | PRN
  Administered 2022-06-04: 10 mL

## 2022-06-04 MED ORDER — STERILE WATER FOR INJECTION IJ SOLN
INTRAMUSCULAR | Status: AC
  Filled 2022-06-04: qty 20

## 2022-06-11 ENCOUNTER — Non-Acute Institutional Stay: Payer: Medicare Other | Admitting: Nurse Practitioner

## 2022-06-11 ENCOUNTER — Encounter: Payer: Self-pay | Admitting: Nurse Practitioner

## 2022-06-11 DIAGNOSIS — J449 Chronic obstructive pulmonary disease, unspecified: Secondary | ICD-10-CM

## 2022-06-11 DIAGNOSIS — Z515 Encounter for palliative care: Secondary | ICD-10-CM

## 2022-06-11 DIAGNOSIS — R0602 Shortness of breath: Secondary | ICD-10-CM
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
## 2022-06-24 ENCOUNTER — Emergency Department: Payer: Medicare Other

## 2022-06-24 ENCOUNTER — Other Ambulatory Visit: Payer: Self-pay

## 2022-06-24 ENCOUNTER — Inpatient Hospital Stay: Payer: Medicare Other

## 2022-06-24 ENCOUNTER — Inpatient Hospital Stay
Admission: EM | Admit: 2022-06-24 | Discharge: 2022-06-28 | DRG: 871 | Disposition: A | Discharge status: Skilled Nursing Facility | Payer: Medicare Other | Source: Skilled Nursing Facility | Attending: Osteopathic Medicine | Admitting: Osteopathic Medicine

## 2022-06-24 DIAGNOSIS — J440 Chronic obstructive pulmonary disease with acute lower respiratory infection: Secondary | ICD-10-CM | POA: Diagnosis present

## 2022-06-24 DIAGNOSIS — N390 Urinary tract infection, site not specified: Secondary | ICD-10-CM | POA: Diagnosis not present

## 2022-06-24 DIAGNOSIS — R059 Cough, unspecified: Secondary | ICD-10-CM

## 2022-06-24 DIAGNOSIS — G40909 Epilepsy, unspecified, not intractable, without status epilepticus: Secondary | ICD-10-CM | POA: Diagnosis present

## 2022-06-24 DIAGNOSIS — Z6839 Body mass index (BMI) 39.0-39.9, adult: Secondary | ICD-10-CM

## 2022-06-24 DIAGNOSIS — Z87891 Personal history of nicotine dependence: Secondary | ICD-10-CM

## 2022-06-24 DIAGNOSIS — Z794 Long term (current) use of insulin: Secondary | ICD-10-CM

## 2022-06-24 DIAGNOSIS — E1142 Type 2 diabetes mellitus with diabetic polyneuropathy: Secondary | ICD-10-CM | POA: Diagnosis present

## 2022-06-24 DIAGNOSIS — Z7189 Other specified counseling: Secondary | ICD-10-CM | POA: Diagnosis not present

## 2022-06-24 DIAGNOSIS — T85528A Displacement of other gastrointestinal prosthetic devices, implants and grafts, initial encounter: Secondary | ICD-10-CM

## 2022-06-24 DIAGNOSIS — E871 Hypo-osmolality and hyponatremia: Secondary | ICD-10-CM | POA: Diagnosis present

## 2022-06-24 DIAGNOSIS — Z66 Do not resuscitate: Secondary | ICD-10-CM | POA: Diagnosis present

## 2022-06-24 DIAGNOSIS — Z515 Encounter for palliative care: Secondary | ICD-10-CM

## 2022-06-24 DIAGNOSIS — J9601 Acute respiratory failure with hypoxia: Secondary | ICD-10-CM | POA: Diagnosis present

## 2022-06-24 DIAGNOSIS — J189 Pneumonia, unspecified organism: Secondary | ICD-10-CM | POA: Diagnosis present

## 2022-06-24 DIAGNOSIS — R54 Age-related physical debility: Secondary | ICD-10-CM | POA: Diagnosis present

## 2022-06-24 DIAGNOSIS — Z7982 Long term (current) use of aspirin: Secondary | ICD-10-CM

## 2022-06-24 DIAGNOSIS — I69354 Hemiplegia and hemiparesis following cerebral infarction affecting left non-dominant side: Secondary | ICD-10-CM | POA: Diagnosis not present

## 2022-06-24 DIAGNOSIS — E785 Hyperlipidemia, unspecified: Secondary | ICD-10-CM | POA: Insufficient documentation

## 2022-06-24 DIAGNOSIS — I69320 Aphasia following cerebral infarction: Secondary | ICD-10-CM | POA: Diagnosis not present

## 2022-06-24 DIAGNOSIS — I10 Essential (primary) hypertension: Secondary | ICD-10-CM | POA: Diagnosis present

## 2022-06-24 DIAGNOSIS — Z431 Encounter for attention to gastrostomy: Secondary | ICD-10-CM | POA: Diagnosis not present

## 2022-06-24 DIAGNOSIS — E861 Hypovolemia: Secondary | ICD-10-CM | POA: Diagnosis present

## 2022-06-24 DIAGNOSIS — J449 Chronic obstructive pulmonary disease, unspecified: Secondary | ICD-10-CM | POA: Insufficient documentation

## 2022-06-24 DIAGNOSIS — J690 Pneumonitis due to inhalation of food and vomit: Secondary | ICD-10-CM

## 2022-06-24 DIAGNOSIS — J9811 Atelectasis: Secondary | ICD-10-CM | POA: Diagnosis present

## 2022-06-24 DIAGNOSIS — R1310 Dysphagia, unspecified: Secondary | ICD-10-CM | POA: Diagnosis present

## 2022-06-24 DIAGNOSIS — A419 Sepsis, unspecified organism: Principal | ICD-10-CM | POA: Diagnosis present

## 2022-06-24 DIAGNOSIS — Z79899 Other long term (current) drug therapy: Secondary | ICD-10-CM

## 2022-06-24 DIAGNOSIS — U071 COVID-19: Secondary | ICD-10-CM | POA: Diagnosis present

## 2022-06-24 DIAGNOSIS — F0150 Vascular dementia without behavioral disturbance: Secondary | ICD-10-CM | POA: Diagnosis present

## 2022-06-24 DIAGNOSIS — Z7951 Long term (current) use of inhaled steroids: Secondary | ICD-10-CM

## 2022-06-24 DIAGNOSIS — J1282 Pneumonia due to coronavirus disease 2019: Secondary | ICD-10-CM | POA: Diagnosis present

## 2022-06-24 DIAGNOSIS — I69391 Dysphagia following cerebral infarction: Secondary | ICD-10-CM

## 2022-06-24 LAB — CBC WITH DIFFERENTIAL/PLATELET
Abs Immature Granulocytes: 0.03 10*3/uL (ref 0.00–0.07)
Basophils Absolute: 0 10*3/uL (ref 0.0–0.1)
Basophils Relative: 0 %
Eosinophils Absolute: 0 10*3/uL (ref 0.0–0.5)
Eosinophils Relative: 0 %
HCT: 42.7 % (ref 36.0–46.0)
Hemoglobin: 14.4 g/dL (ref 12.0–15.0)
Immature Granulocytes: 0 %
Lymphocytes Relative: 12 %
Lymphs Abs: 1 10*3/uL (ref 0.7–4.0)
MCH: 30.1 pg (ref 26.0–34.0)
MCHC: 33.7 g/dL (ref 30.0–36.0)
MCV: 89.1 fL (ref 80.0–100.0)
Monocytes Absolute: 0.7 10*3/uL (ref 0.1–1.0)
Monocytes Relative: 8 %
Neutro Abs: 6.4 10*3/uL (ref 1.7–7.7)
Neutrophils Relative %: 80 %
Platelets: 156 10*3/uL (ref 150–400)
RBC: 4.79 MIL/uL (ref 3.87–5.11)
RDW: 12.9 % (ref 11.5–15.5)
WBC: 8.1 10*3/uL (ref 4.0–10.5)
nRBC: 0 % (ref 0.0–0.2)

## 2022-06-24 LAB — COMPREHENSIVE METABOLIC PANEL
ALT: 34 U/L (ref 0–44)
AST: 52 U/L — ABNORMAL HIGH (ref 15–41)
Albumin: 3.1 g/dL — ABNORMAL LOW (ref 3.5–5.0)
Alkaline Phosphatase: 171 U/L — ABNORMAL HIGH (ref 38–126)
Anion gap: 10 (ref 5–15)
BUN: 16 mg/dL (ref 8–23)
CO2: 22 mmol/L (ref 22–32)
Calcium: 8.8 mg/dL — ABNORMAL LOW (ref 8.9–10.3)
Chloride: 94 mmol/L — ABNORMAL LOW (ref 98–111)
Creatinine, Ser: 0.56 mg/dL (ref 0.44–1.00)
GFR, Estimated: >60 mL/min (ref >60)
Glucose, Bld: 114 mg/dL — ABNORMAL HIGH (ref 70–99)
Potassium: 4 mmol/L (ref 3.5–5.1)
Sodium: 126 mmol/L — ABNORMAL LOW (ref 135–145)
Total Bilirubin: 0.7 mg/dL (ref 0.3–1.2)
Total Protein: 8.6 g/dL — ABNORMAL HIGH (ref 6.5–8.1)

## 2022-06-24 LAB — URINALYSIS, COMPLETE (UACMP) WITH MICROSCOPIC
Bilirubin Urine: NEGATIVE
Glucose, UA: NEGATIVE mg/dL
Hgb urine dipstick: NEGATIVE
Ketones, ur: NEGATIVE mg/dL
Leukocytes,Ua: NEGATIVE
Nitrite: NEGATIVE
Protein, ur: NEGATIVE mg/dL
Specific Gravity, Urine: 1.016 (ref 1.005–1.030)
pH: 7 (ref 5.0–8.0)

## 2022-06-24 LAB — PROTIME-INR
INR: 1.3 — ABNORMAL HIGH (ref 0.8–1.2)
Prothrombin Time: 16.4 seconds — ABNORMAL HIGH (ref 11.4–15.2)

## 2022-06-24 LAB — CBG MONITORING, ED
Glucose-Capillary: 101 mg/dL — ABNORMAL HIGH (ref 70–99)
Glucose-Capillary: 108 mg/dL — ABNORMAL HIGH (ref 70–99)
Glucose-Capillary: 111 mg/dL — ABNORMAL HIGH (ref 70–99)
Glucose-Capillary: 111 mg/dL — ABNORMAL HIGH (ref 70–99)
Glucose-Capillary: 92 mg/dL (ref 70–99)

## 2022-06-24 LAB — APTT: aPTT: 41 seconds — ABNORMAL HIGH (ref 24–36)

## 2022-06-24 LAB — HEMOGLOBIN A1C
Hgb A1c MFr Bld: 5.6 % (ref 4.8–5.6)
Mean Plasma Glucose: 114.02 mg/dL

## 2022-06-24 LAB — RESP PANEL BY RT-PCR (RSV, FLU A&B, COVID)  RVPGX2
Influenza A by PCR: NEGATIVE
Influenza B by PCR: NEGATIVE
Resp Syncytial Virus by PCR: NEGATIVE
SARS Coronavirus 2 by RT PCR: POSITIVE — AB

## 2022-06-24 LAB — OSMOLALITY: Osmolality: 275 mOsm/kg (ref 275–295)

## 2022-06-24 LAB — TSH: TSH: 1.286 u[IU]/mL (ref 0.350–4.500)

## 2022-06-24 LAB — PHOSPHORUS: Phosphorus: 3.6 mg/dL (ref 2.5–4.6)

## 2022-06-24 LAB — NA AND K (SODIUM & POTASSIUM), RAND UR
Potassium Urine: 86 mmol/L
Sodium, Ur: 59 mmol/L

## 2022-06-24 LAB — OSMOLALITY, URINE: Osmolality, Ur: 616 mOsm/kg (ref 300–900)

## 2022-06-24 LAB — LACTIC ACID, PLASMA: Lactic Acid, Venous: 1 mmol/L (ref 0.5–1.9)

## 2022-06-24 LAB — PROCALCITONIN: Procalcitonin: <0.1 ng/mL

## 2022-06-24 LAB — CREATININE, SERUM
Creatinine, Ser: 0.62 mg/dL (ref 0.44–1.00)
GFR, Estimated: >60 mL/min (ref >60)

## 2022-06-24 LAB — MAGNESIUM: Magnesium: 1.7 mg/dL (ref 1.7–2.4)

## 2022-06-24 MED ORDER — PRAVASTATIN SODIUM 20 MG PO TABS
20.0000 mg | ORAL_TABLET | Freq: Every day | ORAL | Status: DC
  Administered 2022-06-24: 20 mg
  Filled 2022-06-24: qty 1

## 2022-06-24 MED ORDER — IPRATROPIUM-ALBUTEROL 20-100 MCG/ACT IN AERS
1.0000 | INHALATION_SPRAY | Freq: Four times a day (QID) | RESPIRATORY_TRACT | Status: DC
  Administered 2022-06-24 – 2022-06-28 (×15): 1 via RESPIRATORY_TRACT
  Filled 2022-06-24 (×2): qty 4

## 2022-06-24 MED ORDER — POLYETHYLENE GLYCOL 3350 17 G PO PACK: 17.0000 g | PACK | Freq: Every day | ORAL | Status: DC | PRN

## 2022-06-24 MED ORDER — FLUDROCORTISONE ACETATE 0.1 MG PO TABS
0.0500 mg | ORAL_TABLET | Freq: Every day | ORAL | Status: DC
  Administered 2022-06-24 – 2022-06-28 (×5): 0.05 mg
  Filled 2022-06-24 (×6): qty 0.5

## 2022-06-24 MED ORDER — LACTATED RINGERS IV SOLN: INTRAVENOUS | Status: DC

## 2022-06-24 MED ORDER — SODIUM CHLORIDE 0.9 % IV SOLN
2.0000 g | INTRAVENOUS | Status: DC
  Administered 2022-06-24: 2 g via INTRAVENOUS
  Filled 2022-06-24: qty 20

## 2022-06-24 MED ORDER — GABAPENTIN 250 MG/5ML PO SOLN
300.0000 mg | Freq: Three times a day (TID) | ORAL | Status: DC
  Administered 2022-06-24 – 2022-06-28 (×11): 300 mg
  Filled 2022-06-24 (×16): qty 6

## 2022-06-24 MED ORDER — TRAZODONE HCL 50 MG PO TABS
25.0000 mg | ORAL_TABLET | Freq: Every evening | ORAL | Status: DC | PRN
  Administered 2022-06-25: 25 mg
  Filled 2022-06-24: qty 1

## 2022-06-24 MED ORDER — ONDANSETRON HCL 4 MG PO TABS: 4.0000 mg | ORAL_TABLET | Freq: Four times a day (QID) | ORAL | Status: DC | PRN

## 2022-06-24 MED ORDER — ACETAMINOPHEN 650 MG RE SUPP
650.0000 mg | Freq: Once | RECTAL | Status: AC
  Administered 2022-06-24: 650 mg via RECTAL
  Filled 2022-06-24: qty 1

## 2022-06-24 MED ORDER — MAGNESIUM HYDROXIDE 400 MG/5ML PO SUSP: 30.0000 mL | Freq: Every day | ORAL | Status: DC | PRN

## 2022-06-24 MED ORDER — ENOXAPARIN SODIUM 60 MG/0.6ML IJ SOSY
0.5000 mg/kg | PREFILLED_SYRINGE | INTRAMUSCULAR | Status: DC
  Administered 2022-06-24 – 2022-06-28 (×5): 55 mg via SUBCUTANEOUS
  Filled 2022-06-24 (×4): qty 0.6

## 2022-06-24 MED ORDER — METRONIDAZOLE 500 MG/100ML IV SOLN
500.0000 mg | Freq: Once | INTRAVENOUS | Status: AC
  Administered 2022-06-24: 500 mg via INTRAVENOUS
  Filled 2022-06-24: qty 100

## 2022-06-24 MED ORDER — LACTATED RINGERS IV BOLUS (SEPSIS)
1000.0000 mL | Freq: Once | INTRAVENOUS | Status: AC
  Administered 2022-06-24: 1000 mL via INTRAVENOUS

## 2022-06-24 MED ORDER — DOCUSATE SODIUM 50 MG/5ML PO LIQD
100.0000 mg | Freq: Two times a day (BID) | ORAL | Status: DC
  Administered 2022-06-24 – 2022-06-27 (×7): 100 mg
  Filled 2022-06-24 (×9): qty 10

## 2022-06-24 MED ORDER — ONDANSETRON HCL 4 MG/2ML IJ SOLN: 4.0000 mg | Freq: Four times a day (QID) | INTRAMUSCULAR | Status: DC | PRN

## 2022-06-24 MED ORDER — PROSOURCE TF PO LIQD
45.0000 mL | Freq: Every day | ORAL | Status: DC
  Administered 2022-06-25 – 2022-06-28 (×4): 45 mL

## 2022-06-24 MED ORDER — ACETAMINOPHEN 325 MG PO TABS: 650.0000 mg | ORAL_TABLET | Freq: Four times a day (QID) | ORAL | Status: DC | PRN

## 2022-06-24 MED ORDER — FREE WATER
150.0000 mL | Freq: Four times a day (QID) | Status: DC
  Administered 2022-06-24 – 2022-06-28 (×16): 150 mL
  Filled 2022-06-24 (×8): qty 150

## 2022-06-24 MED ORDER — IOHEXOL 300 MG/ML  SOLN
100.0000 mL | Freq: Once | INTRAMUSCULAR | Status: AC | PRN
  Administered 2022-06-24: 100 mL via INTRAVENOUS

## 2022-06-24 MED ORDER — GUAIFENESIN 100 MG/5ML PO LIQD
5.0000 mL | Freq: Four times a day (QID) | ORAL | Status: DC
  Administered 2022-06-24 (×2): 5 mL via ORAL
  Filled 2022-06-24 (×2): qty 10

## 2022-06-24 MED ORDER — VALPROIC ACID 250 MG/5ML PO SOLN
125.0000 mg | Freq: Two times a day (BID) | ORAL | Status: DC
  Administered 2022-06-24 – 2022-06-28 (×9): 125 mg
  Filled 2022-06-24 (×9): qty 5

## 2022-06-24 MED ORDER — IPRATROPIUM-ALBUTEROL 0.5-2.5 (3) MG/3ML IN SOLN: 3.0000 mL | Freq: Four times a day (QID) | RESPIRATORY_TRACT | Status: DC

## 2022-06-24 MED ORDER — VANCOMYCIN HCL IN DEXTROSE 1-5 GM/200ML-% IV SOLN
1000.0000 mg | Freq: Once | INTRAVENOUS | Status: AC
  Administered 2022-06-24: 1000 mg via INTRAVENOUS

## 2022-06-24 MED ORDER — VANCOMYCIN HCL IN DEXTROSE 1-5 GM/200ML-% IV SOLN
1000.0000 mg | Freq: Once | INTRAVENOUS | Status: DC
  Filled 2022-06-24: qty 200

## 2022-06-24 MED ORDER — LOPERAMIDE HCL 2 MG/15ML PO SOLN: 2.0000 mg | Freq: Four times a day (QID) | ORAL | Status: DC | PRN

## 2022-06-24 MED ORDER — TRAZODONE HCL 50 MG PO TABS: 25.0000 mg | ORAL_TABLET | Freq: Every evening | ORAL | Status: DC | PRN

## 2022-06-24 MED ORDER — BUDESONIDE 0.25 MG/2ML IN SUSP
0.2500 mg | Freq: Two times a day (BID) | RESPIRATORY_TRACT | Status: DC
  Administered 2022-06-24: 0.25 mg via RESPIRATORY_TRACT
  Filled 2022-06-24 (×3): qty 2

## 2022-06-24 MED ORDER — CITALOPRAM HYDROBROMIDE 20 MG PO TABS
10.0000 mg | ORAL_TABLET | Freq: Every day | ORAL | Status: DC
  Administered 2022-06-24 – 2022-06-28 (×5): 10 mg
  Filled 2022-06-24 (×6): qty 1

## 2022-06-24 MED ORDER — ONDANSETRON HCL 4 MG/2ML IJ SOLN
4.0000 mg | Freq: Once | INTRAMUSCULAR | Status: AC
  Administered 2022-06-24: 4 mg via INTRAVENOUS
  Filled 2022-06-24: qty 2

## 2022-06-24 MED ORDER — ACETAMINOPHEN 325 MG PO TABS
650.0000 mg | ORAL_TABLET | Freq: Four times a day (QID) | ORAL | Status: DC | PRN
  Administered 2022-06-25 – 2022-06-27 (×2): 650 mg
  Filled 2022-06-24 (×2): qty 2

## 2022-06-24 MED ORDER — ACETAMINOPHEN 650 MG RE SUPP: 650.0000 mg | Freq: Four times a day (QID) | RECTAL | Status: DC | PRN

## 2022-06-24 MED ORDER — GUAIFENESIN 100 MG/5ML PO LIQD
5.0000 mL | Freq: Four times a day (QID) | ORAL | Status: DC
  Administered 2022-06-24 – 2022-06-28 (×14): 5 mL
  Filled 2022-06-24 (×13): qty 10

## 2022-06-24 MED ORDER — ASPIRIN 81 MG PO CHEW
81.0000 mg | CHEWABLE_TABLET | Freq: Every day | ORAL | Status: DC
  Administered 2022-06-24 – 2022-06-28 (×5): 81 mg
  Filled 2022-06-24 (×5): qty 1

## 2022-06-24 MED ORDER — SODIUM CHLORIDE 0.9 % IV SOLN: INTRAVENOUS | Status: DC

## 2022-06-24 MED ORDER — VANCOMYCIN HCL IN DEXTROSE 1-5 GM/200ML-% IV SOLN
1000.0000 mg | Freq: Once | INTRAVENOUS | Status: AC
  Administered 2022-06-24: 1000 mg via INTRAVENOUS
  Filled 2022-06-24: qty 200

## 2022-06-24 MED ORDER — SODIUM CHLORIDE 0.9 % IV SOLN
2.0000 g | Freq: Once | INTRAVENOUS | Status: AC
  Administered 2022-06-24: 2 g via INTRAVENOUS
  Filled 2022-06-24: qty 12.5

## 2022-06-24 MED ORDER — FLORANEX PO PACK
1.0000 g | PACK | Freq: Three times a day (TID) | ORAL | Status: DC
  Administered 2022-06-25 – 2022-06-28 (×10): 1 g
  Filled 2022-06-24 (×11): qty 1

## 2022-06-24 MED ORDER — INSULIN ASPART 100 UNIT/ML IJ SOLN: 0.0000 [IU] | INTRAMUSCULAR | Status: DC

## 2022-06-24 MED ORDER — SODIUM CHLORIDE 0.9 % IV SOLN
500.0000 mg | INTRAVENOUS | Status: AC
  Administered 2022-06-24 – 2022-06-28 (×5): 500 mg via INTRAVENOUS
  Filled 2022-06-24 (×2): qty 5
  Filled 2022-06-24: qty 500
  Filled 2022-06-24: qty 5
  Filled 2022-06-24: qty 500

## 2022-06-24 NOTE — Plan of Care (Signed)
Patient was seen and examined at bedside, patient presented with sepsis due to aspiration pneumonia.  Patient has PEG tube which was replaced in the ED, CT scan shows appropriate placement, no acute findings in the abdomen pelvis, patient does have right lower lobe pneumonia most likely due to aspiration, we will continue current medications.  Aspiration precautions, deep suctioning was advised to RN and RT.  Turn patient frequently, continue fall precautions.

## 2022-06-24 NOTE — Assessment & Plan Note (Signed)
-  This is likely the culprit for #1. - Management as above.

## 2022-06-24 NOTE — Progress Notes (Signed)
PHARMACY -  BRIEF ANTIBIOTIC NOTE   Pharmacy has received consult(s) for Vancomycin from an ED provider.  The patient's profile has been reviewed for ht/wt/allergies/indication/available labs.    One time order(s) placed for Vancomycin 2 gm per pt wt > 100 kg.  Further antibiotics/pharmacy consults should be ordered by admitting physician if indicated.                       Thank you, Renda Rolls, PharmD, [facility] 06/24/2022 4:50 AM

## 2022-06-24 NOTE — Assessment & Plan Note (Signed)
-  We will continue liquid valproate.
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
## 2022-06-24 NOTE — Assessment & Plan Note (Signed)
His G-tube was replaced in the ER.
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
## 2022-06-24 NOTE — Progress Notes (Signed)
CODE SEPSIS - PHARMACY COMMUNICATION  **Broad Spectrum Antibiotics should be administered within 1 hour of Sepsis diagnosis**  Time Code Sepsis Called/Page Received: 0444  Antibiotics Ordered: Cefepime, Flagyl, Vancomycin  Time of 1st antibiotic administration: Jim Hogg, PharmD, [facility] 06/24/2022 5:50 AM

## 2022-06-24 NOTE — ED Notes (Addendum)
Patient has copious amount of secretions, patient unable to cough up secretions out of mouth. Patient continuously suctioned

## 2022-06-24 NOTE — Assessment & Plan Note (Signed)
-  Sepsis manifested by tachycardia, fever and tachypnea. - The patient was admitted to a progressive unit bed. - Will continue antibiotic therapy with IV Rocephin and Zithromax. - We will continue hydration with IV normal saline. - Given the high likelihood of aspiration pneumonia we will hold off her G-tube feedings for now.

## 2022-06-24 NOTE — ED Triage Notes (Signed)
Pt presents to ER via [facility] from [facility].  Ems was called out d/t pts PEG tube becoming dislodged by accident.  On arrival, ems states pt was hypoxic, with O2 sats of 88% on RA.  Pt does not wear O2 at home and states that pt has hx of aspiration PNA.  Pt noted to have crackles on assessment.  Pt is alert to her baseline per facility staff and ems.

## 2022-06-24 NOTE — Assessment & Plan Note (Signed)
-  We will continue statin therapy. 

## 2022-06-24 NOTE — Assessment & Plan Note (Signed)
-  This is likely hypovolemic. - Will obtain hyponatremia workup - The patient will be hydrated with IV normal saline and will follow BMP.

## 2022-06-24 NOTE — Assessment & Plan Note (Signed)
-  The patient will be placed on supplemental coverage with NovoLog. - We will continue his Neurontin.
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
## 2022-06-24 NOTE — ED Notes (Signed)
I received this patient, patient cleaned due to incontinence. Patient diaper, sheets, and gown saturated in urine. Patient also had a BM. Patient gown changed due to green dried secretions on gown. Patient placed on purewick and new diaper applied.

## 2022-06-24 NOTE — ED Notes (Signed)
Patient becomes easily agitated when nurse attempts to help patient with anything

## 2022-06-24 NOTE — Sepsis Progress Note (Signed)
Elink monitoring for the code sepsis protocol.  

## 2022-06-24 NOTE — Assessment & Plan Note (Signed)
-  We will place the patient on DuoNebs 4 times daily and every 4 hours as needed and resume his Pulmicort.

## 2022-06-24 NOTE — Progress Notes (Signed)
PHARMACIST - PHYSICIAN COMMUNICATION  CONCERNING:  Enoxaparin (Lovenox) for DVT Prophylaxis    RECOMMENDATION: Patient was prescribed enoxaprin 40mg  q24 hours for VTE prophylaxis.   Filed Weights   06/24/22 0444  Weight: 110 kg (242 lb 8.1 oz)    Body mass index is 39.14 kg/m.  Estimated Creatinine Clearance: 89.3 mL/min (by C-G formula based on SCr of 0.56 mg/dL).   Based on [place] patient is candidate for enoxaparin 0.5mg /kg TBW SQ every 24 hours based on BMI being >30.  DESCRIPTION: Pharmacy has adjusted enoxaparin dose per [facility] policy.  Patient is now receiving enoxaparin 0.5 mg/kg every 24 hours   Renda Rolls, PharmD, [facility] 06/24/2022 6:10 AM

## 2022-06-25 LAB — PROTIME-INR
INR: 1.2 (ref 0.8–1.2)
Prothrombin Time: 14.8 seconds (ref 11.4–15.2)

## 2022-06-25 LAB — BASIC METABOLIC PANEL
Anion gap: 4 — ABNORMAL LOW (ref 5–15)
BUN: 10 mg/dL (ref 8–23)
CO2: 29 mmol/L (ref 22–32)
Calcium: 8 mg/dL — ABNORMAL LOW (ref 8.9–10.3)
Chloride: 100 mmol/L (ref 98–111)
Creatinine, Ser: 0.58 mg/dL (ref 0.44–1.00)
GFR, Estimated: >60 mL/min (ref >60)
Glucose, Bld: 97 mg/dL (ref 70–99)
Potassium: 3.6 mmol/L (ref 3.5–5.1)
Sodium: 133 mmol/L — ABNORMAL LOW (ref 135–145)

## 2022-06-25 LAB — CBG MONITORING, ED
Glucose-Capillary: 93 mg/dL (ref 70–99)
Glucose-Capillary: 99 mg/dL (ref 70–99)
Glucose-Capillary: 98 mg/dL (ref 70–99)
Glucose-Capillary: 97 mg/dL (ref 70–99)
Glucose-Capillary: 128 mg/dL — ABNORMAL HIGH (ref 70–99)
Glucose-Capillary: 113 mg/dL — ABNORMAL HIGH (ref 70–99)

## 2022-06-25 LAB — MAGNESIUM: Magnesium: 1.7 mg/dL (ref 1.7–2.4)

## 2022-06-25 LAB — URINE CULTURE: Culture: >=100000 — AB

## 2022-06-25 LAB — URINALYSIS, W/ REFLEX TO CULTURE (INFECTION SUSPECTED)
Bacteria, UA: NONE SEEN
Bilirubin Urine: NEGATIVE
Glucose, UA: NEGATIVE mg/dL
Ketones, ur: NEGATIVE mg/dL
Leukocytes,Ua: NEGATIVE
Nitrite: NEGATIVE
Protein, ur: NEGATIVE mg/dL
RBC / HPF: >50 RBC/hpf (ref 0–5)
Specific Gravity, Urine: 1.019 (ref 1.005–1.030)
Squamous Epithelial / HPF: NONE SEEN /HPF (ref 0–5)
WBC, UA: NONE SEEN WBC/hpf (ref 0–5)
pH: 5 (ref 5.0–8.0)

## 2022-06-25 LAB — CBC
HCT: 41.5 % (ref 36.0–46.0)
Hemoglobin: 13.4 g/dL (ref 12.0–15.0)
MCH: 29.7 pg (ref 26.0–34.0)
MCHC: 32.3 g/dL (ref 30.0–36.0)
MCV: 92 fL (ref 80.0–100.0)
Platelets: 137 10*3/uL — ABNORMAL LOW (ref 150–400)
RBC: 4.51 MIL/uL (ref 3.87–5.11)
RDW: 13.1 % (ref 11.5–15.5)
WBC: 8.9 10*3/uL (ref 4.0–10.5)
nRBC: 0 % (ref 0.0–0.2)

## 2022-06-25 LAB — CORTISOL-AM, BLOOD: Cortisol - AM: 14.9 ug/dL (ref 6.7–22.6)

## 2022-06-25 LAB — PHOSPHORUS: Phosphorus: 2.9 mg/dL (ref 2.5–4.6)

## 2022-06-25 LAB — PROCALCITONIN: Procalcitonin: <0.1 ng/mL

## 2022-06-25 MED ORDER — SODIUM CHLORIDE 0.9 % IV SOLN
100.0000 mg | Freq: Every day | INTRAVENOUS | Status: AC
  Administered 2022-06-26 – 2022-06-27 (×2): 100 mg via INTRAVENOUS
  Filled 2022-06-25 (×2): qty 20

## 2022-06-25 MED ORDER — ATROPINE SULFATE 1 % OP SOLN
2.0000 [drp] | Freq: Four times a day (QID) | OPHTHALMIC | Status: DC | PRN
  Filled 2022-06-25: qty 2

## 2022-06-25 MED ORDER — SODIUM CHLORIDE 0.9 % IV SOLN
3.0000 g | Freq: Four times a day (QID) | INTRAVENOUS | Status: DC
  Administered 2022-06-25 – 2022-06-28 (×12): 3 g via INTRAVENOUS
  Filled 2022-06-25: qty 8
  Filled 2022-06-25: qty 3
  Filled 2022-06-25 (×12): qty 8

## 2022-06-25 MED ORDER — NIRMATRELVIR/RITONAVIR (PAXLOVID)TABLET
3.0000 | ORAL_TABLET | Freq: Two times a day (BID) | ORAL | Status: DC
  Filled 2022-06-25: qty 30

## 2022-06-25 MED ORDER — GLUCERNA 1.5 CAL PO LIQD: 1000.0000 mL | ORAL | Status: DC

## 2022-06-25 MED ORDER — SODIUM CHLORIDE 0.9 % IV SOLN
200.0000 mg | Freq: Once | INTRAVENOUS | Status: AC
  Administered 2022-06-25: 200 mg via INTRAVENOUS
  Filled 2022-06-25: qty 200

## 2022-06-25 NOTE — ED Notes (Addendum)
Patient repositioned on left side  

## 2022-06-25 NOTE — ED Notes (Signed)
Patient has dark red bloody urine noted in suction canister

## 2022-06-25 NOTE — ED Notes (Signed)
Called dietary at 1654 for glucerna and profeed and called equipment at 1703 for kangaroo pump.
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
## 2022-06-25 NOTE — Progress Notes (Signed)
SLP Follow up Note  Patient Details Name: Kaitlyn Ruiz MRN: [id number] DOB: 11/02/1955  ST consulted as pt appears to be struggling with secretion management. Unfortunately, pt continues to present with chronic profound dysphagia and decreased secretion management is a symptom of such.   At this time, skilled ST intervention is unable to reverse chronic nature of pt's dysphagia.   Kiyoshi Schaab B. Rutherford Nail, M.S., CCC-SLP, Mining engineer Certified Brain Injury [place] Office [phone] Ascom [phone] Fax [phone]

## 2022-06-25 NOTE — ED Notes (Signed)
Respiratory was called to evaluate pt at this time and agrees that pt is struggling with secretions due to the inability to swallow. Pt has trouble swallowing the secretions or coughing them out.

## 2022-06-25 NOTE — ED Notes (Signed)
Awaiting kangaroo pump to be brought to ED to administer feeding

## 2022-06-25 NOTE — ED Notes (Signed)
Secretary called for transport 

## 2022-06-25 NOTE — ED Notes (Signed)
Called dietary at this time for supplemental feeding for g-tube at this time.

## 2022-06-25 NOTE — ED Notes (Signed)
Speech Therapy was recommenced by RT so pt can be evaluated by them. Speech Therapy has been contacted at this time.

## 2022-06-25 NOTE — ED Notes (Signed)
56F in/out cath used to obtain UA with culture per Dr. Dwyane Dee due to dark blood noted in pure wick suction container. Urine obtained, yellow urine noted. No evidence of blood noted in diaper. I assessed patient perineal area, wiped vaginal region and buttock and no evidence of blood. Dr. Dwyane Dee notified

## 2022-06-25 NOTE — Consult Note (Signed)
Remdesivir - Pharmacy Brief Note   O:  ALT: 34 CXR: Linear opacities in the lung bases bilaterally favored to predominantly reflect subsegmental atelectasis.  SpO2: 97-100% on 2L [place]   A/P:  Remdesivir 200 mg IVPB once followed by 100 mg IVPB daily x 4 days.   Mikeria Valin Rodriguez-Guzman PharmD, BCPS 06/25/2022 12:34 PM

## 2022-06-25 NOTE — ED Notes (Signed)
This nurse was walking down the hallway passing the pt's room when this nurse heard a gurgling sound.This nurse immediately went into the room to see that the patient is having trouble breathing and swallowing. This nurse gave pt emesis bag and told pt to spit in the bag. Pt was able to spit and at first refused to be suctioned. This nurse told the pt that it needs to be done to the pt can breathe easier.

## 2022-06-26 DIAGNOSIS — J189 Pneumonia, unspecified organism: Secondary | ICD-10-CM | POA: Diagnosis not present

## 2022-06-26 DIAGNOSIS — A419 Sepsis, unspecified organism: Secondary | ICD-10-CM | POA: Diagnosis not present

## 2022-06-26 LAB — BASIC METABOLIC PANEL
Anion gap: 6 (ref 5–15)
BUN: 13 mg/dL (ref 8–23)
CO2: 32 mmol/L (ref 22–32)
Calcium: 8 mg/dL — ABNORMAL LOW (ref 8.9–10.3)
Chloride: 99 mmol/L (ref 98–111)
Creatinine, Ser: 0.47 mg/dL (ref 0.44–1.00)
GFR, Estimated: >60 mL/min (ref >60)
Glucose, Bld: 92 mg/dL (ref 70–99)
Potassium: 3.5 mmol/L (ref 3.5–5.1)
Sodium: 137 mmol/L (ref 135–145)

## 2022-06-26 LAB — CBC
HCT: 36.6 % (ref 36.0–46.0)
Hemoglobin: 12.1 g/dL (ref 12.0–15.0)
MCH: 30 pg (ref 26.0–34.0)
MCHC: 33.1 g/dL (ref 30.0–36.0)
MCV: 90.8 fL (ref 80.0–100.0)
Platelets: 117 10*3/uL — ABNORMAL LOW (ref 150–400)
RBC: 4.03 MIL/uL (ref 3.87–5.11)
RDW: 12.9 % (ref 11.5–15.5)
WBC: 5.7 10*3/uL (ref 4.0–10.5)
nRBC: 0 % (ref 0.0–0.2)

## 2022-06-26 LAB — GLUCOSE, CAPILLARY
Glucose-Capillary: 100 mg/dL — ABNORMAL HIGH (ref 70–99)
Glucose-Capillary: 103 mg/dL — ABNORMAL HIGH (ref 70–99)
Glucose-Capillary: 109 mg/dL — ABNORMAL HIGH (ref 70–99)
Glucose-Capillary: 81 mg/dL (ref 70–99)
Glucose-Capillary: 87 mg/dL (ref 70–99)
Glucose-Capillary: 92 mg/dL (ref 70–99)
Glucose-Capillary: 95 mg/dL (ref 70–99)

## 2022-06-26 LAB — MAGNESIUM: Magnesium: 1.7 mg/dL (ref 1.7–2.4)

## 2022-06-26 LAB — PHOSPHORUS: Phosphorus: 1.7 mg/dL — ABNORMAL LOW (ref 2.5–4.6)

## 2022-06-26 MED ORDER — FLUTICASONE PROPIONATE HFA 44 MCG/ACT IN AERO
1.0000 | INHALATION_SPRAY | Freq: Two times a day (BID) | RESPIRATORY_TRACT | Status: DC
  Administered 2022-06-26 – 2022-06-28 (×5): 1 via RESPIRATORY_TRACT
  Filled 2022-06-26 (×2): qty 10.6
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
## 2022-06-26 NOTE — Plan of Care (Signed)
  Problem: Clinical Measurements: Goal: Diagnostic test results will improve Outcome: Progressing   Problem: Clinical Measurements: Goal: Signs and symptoms of infection will decrease Outcome: Progressing   Problem: Respiratory: Goal: Ability to maintain adequate ventilation will improve Outcome: Progressing   
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
## 2022-06-27 DIAGNOSIS — A419 Sepsis, unspecified organism: Secondary | ICD-10-CM | POA: Diagnosis not present

## 2022-06-27 DIAGNOSIS — J189 Pneumonia, unspecified organism: Secondary | ICD-10-CM | POA: Diagnosis not present

## 2022-06-27 LAB — CBC
HCT: 38.3 % (ref 36.0–46.0)
Hemoglobin: 12.6 g/dL (ref 12.0–15.0)
MCH: 30.5 pg (ref 26.0–34.0)
MCHC: 32.9 g/dL (ref 30.0–36.0)
MCV: 92.7 fL (ref 80.0–100.0)
Platelets: 97 10*3/uL — ABNORMAL LOW (ref 150–400)
RBC: 4.13 MIL/uL (ref 3.87–5.11)
RDW: 13 % (ref 11.5–15.5)
WBC: 4.9 10*3/uL (ref 4.0–10.5)
nRBC: 0.4 % — ABNORMAL HIGH (ref 0.0–0.2)

## 2022-06-27 LAB — BASIC METABOLIC PANEL
Anion gap: 5 (ref 5–15)
BUN: 11 mg/dL (ref 8–23)
CO2: 31 mmol/L (ref 22–32)
Calcium: 8.1 mg/dL — ABNORMAL LOW (ref 8.9–10.3)
Chloride: 102 mmol/L (ref 98–111)
Creatinine, Ser: 0.52 mg/dL (ref 0.44–1.00)
GFR, Estimated: >60 mL/min (ref >60)
Glucose, Bld: 94 mg/dL (ref 70–99)
Potassium: 4.3 mmol/L (ref 3.5–5.1)
Sodium: 138 mmol/L (ref 135–145)

## 2022-06-27 LAB — GLUCOSE, CAPILLARY
Glucose-Capillary: 101 mg/dL — ABNORMAL HIGH (ref 70–99)
Glucose-Capillary: 85 mg/dL (ref 70–99)
Glucose-Capillary: 101 mg/dL — ABNORMAL HIGH (ref 70–99)
Glucose-Capillary: 102 mg/dL — ABNORMAL HIGH (ref 70–99)
Glucose-Capillary: 107 mg/dL — ABNORMAL HIGH (ref 70–99)
Glucose-Capillary: 117 mg/dL — ABNORMAL HIGH (ref 70–99)

## 2022-06-27 LAB — MAGNESIUM: Magnesium: 2.3 mg/dL (ref 1.7–2.4)

## 2022-06-27 LAB — PHOSPHORUS: Phosphorus: 2.1 mg/dL — ABNORMAL LOW (ref 2.5–4.6)
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
## 2022-06-27 NOTE — TOC Initial Note (Signed)
Transition of Care [facility]) - Initial/Assessment Note    Patient Details  Name: Kaitlyn Ruiz MRN: [id number] Date of Birth: 04-08-1956  Transition of Care [facility]) CM/SW Contact:    Harriet Masson, RN Phone [phone] 06/27/2022, 2:46 PM  Clinical Narrative:                 Pt from [place] attempted to contact [place]  [phone] or [phone] to inquire on return status for COVID isolation patients. Only able to leave a message requesting a call back.   TOC will continue outreach calls of inquiries for pt's return to [facility].   Expected Discharge Plan: Long Term Nursing Home Barriers to Discharge: Continued Medical Work up   Patient Goals and CMS Choice            Expected Discharge Plan and Services   Discharge Planning Services: CM Consult   Living arrangements for the past 2 months:  (LTC [facility])                                      Prior Living Arrangements/Services Living arrangements for the past 2 months:  (Education officer, museum) Lives with:: Facility Resident Engineer, agricultural)                   Activities of Daily Living Home Assistive Devices/Equipment: None ADL Screening (condition at time of admission) Patient's cognitive ability adequate to safely complete daily activities?: No Is the patient deaf or have difficulty hearing?: No Does the patient have difficulty seeing, even when wearing glasses/contacts?: No Does the patient have difficulty concentrating, remembering, or making decisions?: Yes Patient able to express need for assistance with ADLs?: No Does the patient have difficulty dressing or bathing?: Yes Independently performs ADLs?: No Communication: Needs assistance Is this a change from baseline?: Pre-admission baseline Dressing (OT): Dependent Is this a change from baseline?: Pre-admission baseline Feeding: Dependent Is this a change from baseline?: Pre-admission baseline Bathing: Dependent Is this a change from  baseline?: Pre-admission baseline Toileting: Dependent Is this a change from baseline?: Pre-admission baseline In/Out Bed: Dependent Is this a change from baseline?: Pre-admission baseline Does the patient have difficulty walking or climbing stairs?: Yes Weakness of Legs: Both Weakness of Arms/Hands: Both  Permission Sought/Granted                  Emotional Assessment       Orientation: : Oriented to Place, Oriented to Self Alcohol / Substance Use: Not Applicable    Admission diagnosis:  Hyponatremia [E87.1] Dislodged gastrostomy tube [T85.528A] Acute respiratory failure with hypoxia ([place]) [J96.01] Sepsis due to pneumonia ([place]) [J18.9, A41.9] Acute sepsis ([place]) [A41.9] Cough, unspecified type [R05.9] Patient Active Problem List   Diagnosis Date Noted   Sepsis due to pneumonia ([place]) 06/24/2022   Aspiration pneumonia ([place]) 06/24/2022   Chronic obstructive pulmonary disease (COPD) ([place]) 06/24/2022   Type 2 diabetes mellitus with peripheral neuropathy ([place]) 06/24/2022   Dyslipidemia 06/24/2022   Seizure disorder ([place]) 06/24/2022   Dislodged gastrostomy tube 06/24/2022   Feeding tube dysfunction 09/03/2021   Sepsis ([place]) 08/27/2021   HCAP,  (healthcare-associated pneumonia) 08/27/2021   Recent mechanical ventilation 3/14-3/15/23 08/27/2021   Chronic hypotension 08/27/2021   Hypernatremia 08/22/2021   MSSA (methicillin susceptible Staphylococcus aureus) pneumonia ([place]) 08/22/2021   Diarrhea 08/22/2021   (HFpEF) heart failure with preserved ejection fraction ([place]) 08/22/2021   History of CVA (cerebrovascular  accident) 08/22/2021   Hypokalemia 08/22/2021   Acute respiratory failure with hypoxemia ([place]) 08/11/2021   Cerebral artery occlusion with cerebral infarction ([place]) 08/04/2021   Malfunction of percutaneous endoscopic gastrostomy (PEG) tube ([place]) 08/03/2021   Chronic hepatitis C ([place]) 08/03/2021   Depression 08/03/2021   Tobacco use disorder 08/03/2021    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   Acute on chronic respiratory failure with hypoxia ([place]) 05/26/2019   COPD with chronic bronchitis 05/26/2019   Hyponatremia 05/26/2019   UTI (urinary tract infection) 05/26/2019   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) 05/26/2019   Dysphagia as late effect of cerebrovascular accident (CVA) 05/26/2019   Appetite impaired 08/15/2018   Anorexia 06/05/2018   Weakness generalized 06/05/2018   Esophageal dysphagia    Oropharyngeal dysphagia    Neurological dysfunction    Endotracheal tube present    Palliative care encounter    Acute metabolic encephalopathy    Septic shock ([place]) 04/10/2017   HTN (hypertension) 12/28/2013   PCP:  Patient, No Pcp Per Pharmacy:   [place], [place] [facility] [place] [address] [place] [address] Phone: [phone] Fax: [phone]     Social Determinants of Health (SDOH) Social History: SDOH Screenings   Tobacco Use: Medium Risk (06/24/2022)   SDOH Interventions:     Readmission Risk Interventions    08/14/2021   10:11 AM  Readmission Risk Prevention Plan  Transportation Screening Complete  PCP or Specialist Appt within 3-5 Days Complete  HRI or Home Care Consult Complete  Social Work Consult for [place] Planning/Counseling Complete  Medication Review Press photographer) Complete

## 2022-06-28 ENCOUNTER — Encounter: Payer: Self-pay | Admitting: Family Medicine

## 2022-06-28 DIAGNOSIS — Z7189 Other specified counseling: Secondary | ICD-10-CM

## 2022-06-28 DIAGNOSIS — A419 Sepsis, unspecified organism: Secondary | ICD-10-CM | POA: Diagnosis not present

## 2022-06-28 DIAGNOSIS — Z515 Encounter for palliative care: Secondary | ICD-10-CM

## 2022-06-28 DIAGNOSIS — J189 Pneumonia, unspecified organism: Secondary | ICD-10-CM | POA: Diagnosis not present

## 2022-06-28 DIAGNOSIS — J690 Pneumonitis due to inhalation of food and vomit: Secondary | ICD-10-CM | POA: Diagnosis not present

## 2022-06-28 LAB — PHOSPHORUS: Phosphorus: 2.9 mg/dL (ref 2.5–4.6)

## 2022-06-28 LAB — CBC
HCT: 37 % (ref 36.0–46.0)
Hemoglobin: 12.2 g/dL (ref 12.0–15.0)
MCH: 30 pg (ref 26.0–34.0)
MCHC: 33 g/dL (ref 30.0–36.0)
MCV: 90.9 fL (ref 80.0–100.0)
Platelets: 132 10*3/uL — ABNORMAL LOW (ref 150–400)
RBC: 4.07 MIL/uL (ref 3.87–5.11)
RDW: 12.6 % (ref 11.5–15.5)
WBC: 5.6 10*3/uL (ref 4.0–10.5)
nRBC: 0 % (ref 0.0–0.2)

## 2022-06-28 LAB — BASIC METABOLIC PANEL
Anion gap: 7 (ref 5–15)
BUN: 13 mg/dL (ref 8–23)
CO2: 36 mmol/L — ABNORMAL HIGH (ref 22–32)
Calcium: 8.1 mg/dL — ABNORMAL LOW (ref 8.9–10.3)
Chloride: 96 mmol/L — ABNORMAL LOW (ref 98–111)
Creatinine, Ser: 0.48 mg/dL (ref 0.44–1.00)
GFR, Estimated: >60 mL/min (ref >60)
Glucose, Bld: 107 mg/dL — ABNORMAL HIGH (ref 70–99)
Potassium: 3.6 mmol/L (ref 3.5–5.1)
Sodium: 139 mmol/L (ref 135–145)

## 2022-06-28 LAB — GLUCOSE, CAPILLARY
Glucose-Capillary: 107 mg/dL — ABNORMAL HIGH (ref 70–99)
Glucose-Capillary: 104 mg/dL — ABNORMAL HIGH (ref 70–99)
Glucose-Capillary: 91 mg/dL (ref 70–99)

## 2022-06-28 LAB — MAGNESIUM: Magnesium: 1.9 mg/dL (ref 1.7–2.4)

## 2022-06-28 MED ORDER — LISINOPRIL 2.5 MG PO TABS: 2.5000 mg | ORAL_TABLET | Freq: Every day | ORAL | Status: DC

## 2022-06-28 MED ORDER — GUAIFENESIN 100 MG/5ML PO LIQD: 5.0000 mL | Freq: Four times a day (QID) | ORAL | 0 refills | Status: AC

## 2022-06-28 MED ORDER — AMOXICILLIN-POT CLAVULANATE 400-57 MG/5ML PO SUSR: 875.0000 mg | Freq: Two times a day (BID) | ORAL | 0 refills | Status: AC

## 2022-06-28 NOTE — Progress Notes (Signed)
Patient report called to [facility] RN who verbalized acceptance and understanding. EMS notified and patient is 3rd on list. Awaiting transport.

## 2022-06-28 NOTE — TOC Transition Note (Addendum)
Transition of Care [facility]) - CM/SW Discharge Note   Patient Details  Name: Kaitlyn Ruiz MRN: [id number] Date of Birth: 04-Feb-1956  Transition [facility] Care [facility]) CM/SW Contact:  Beverly Sessions, RN Phone Number: 06/28/2022, 1:45 PM   Clinical Narrative:    Patient will DC to: [place] date: 06/28/22  Family notified: Sister [name]  Transport by: Johnanna Schneiders  Per MD patient ready for DC to . RN, patient's family, and facility notified of DC. Discharge Summary sent to facility. RN given number for report. DC packet on chart. Ambulance transport requested for patient.   Confirmed with MD patient is DNR  status, signed DNR on chart  TOC signing off.  Isaias Cowman [facility] [phone]    Final next level of care: Long Term Nursing Home Barriers to Discharge: Continued Medical Work up   Patient Goals and CMS Choice      Discharge Placement                         Discharge Plan and Services Additional resources added to the After Visit Summary for     Discharge Planning Services: CM Consult                                 Social Determinants of Health (SDOH) Interventions SDOH Screenings   Tobacco Use: Medium Risk (06/24/2022)     Readmission Risk Interventions    08/14/2021   10:11 AM  Readmission Risk Prevention Plan  Transportation Screening Complete  PCP or Specialist Appt within 3-5 Days Complete  HRI or [place] Complete  Social Work Consult for [place] Planning/Counseling Complete  Medication Review Press photographer) Complete
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
## 2022-06-28 NOTE — TOC Initial Note (Signed)
Transition of Care [facility]) - Initial/Assessment Note    Patient Details  Name: Kaitlyn Ruiz MRN: [id number] Date of Birth: 12-08-1955  Transition of Care [facility]) CM/SW Contact:    Beverly Sessions, RN Phone Number: 06/28/2022, 11:36 AM  Clinical Narrative:                 Damaris Schooner with Audry Pili at [employer].  He confirms that patient is LTC resident, and will not have to complete covid isolation prior to return. MD updated   Expected Discharge Plan: Long Term Nursing Home Barriers to Discharge: Continued Medical Work up   Patient Goals and CMS Choice            Expected Discharge Plan and Services   Discharge Planning Services: CM Consult   Living arrangements for the past 2 months:  (LTC [facility])                                      Prior Living Arrangements/Services Living arrangements for the past 2 months:  (Education officer, museum) Lives with:: Facility Resident Engineer, agricultural)                   Activities of Daily Living Home Assistive Devices/Equipment: None ADL Screening (condition at time of admission) Patient's cognitive ability adequate to safely complete daily activities?: No Is the patient deaf or have difficulty hearing?: No Does the patient have difficulty seeing, even when wearing glasses/contacts?: No Does the patient have difficulty concentrating, remembering, or making decisions?: Yes Patient able to express need for assistance with ADLs?: No Does the patient have difficulty dressing or bathing?: Yes Independently performs ADLs?: No Communication: Needs assistance Is this a change from baseline?: Pre-admission baseline Dressing (OT): Dependent Is this a change from baseline?: Pre-admission baseline Feeding: Dependent Is this a change from baseline?: Pre-admission baseline Bathing: Dependent Is this a change from baseline?: Pre-admission baseline Toileting: Dependent Is this a change from baseline?: Pre-admission baseline In/Out Bed:  Dependent Is this a change from baseline?: Pre-admission baseline Does the patient have difficulty walking or climbing stairs?: Yes Weakness of Legs: Both Weakness of Arms/Hands: Both  Permission Sought/Granted                  Emotional Assessment       Orientation: : Oriented to Place, Oriented to Self Alcohol / Substance Use: Not Applicable    Admission diagnosis:  Hyponatremia [E87.1] Dislodged gastrostomy tube [T85.528A] Acute respiratory failure with hypoxia ([place]) [J96.01] Sepsis due to pneumonia ([place]) [J18.9, A41.9] Acute sepsis ([place]) [A41.9] Cough, unspecified type [R05.9] Patient Active Problem List   Diagnosis Date Noted   Sepsis due to pneumonia ([place]) 06/24/2022   Aspiration pneumonia ([place]) 06/24/2022   Chronic obstructive pulmonary disease (COPD) ([place]) 06/24/2022   Type 2 diabetes mellitus with peripheral neuropathy ([place]) 06/24/2022   Dyslipidemia 06/24/2022   Seizure disorder ([place]) 06/24/2022   Dislodged gastrostomy tube 06/24/2022   Feeding tube dysfunction 09/03/2021   Sepsis ([place]) 08/27/2021   HCAP,  (healthcare-associated pneumonia) 08/27/2021   Recent mechanical ventilation 3/14-3/15/23 08/27/2021   Chronic hypotension 08/27/2021   Hypernatremia 08/22/2021   MSSA (methicillin susceptible Staphylococcus aureus) pneumonia ([place]) 08/22/2021   Diarrhea 08/22/2021   (HFpEF) heart failure with preserved ejection fraction ([place]) 08/22/2021   History of CVA (cerebrovascular accident) 08/22/2021   Hypokalemia 08/22/2021   Acute respiratory failure with hypoxemia ([place]) 08/11/2021   Cerebral artery occlusion  with cerebral infarction ([place]) 08/04/2021   Malfunction of percutaneous endoscopic gastrostomy (PEG) tube ([place]) 08/03/2021   Chronic hepatitis C ([place]) 08/03/2021   Depression 08/03/2021   Tobacco use disorder 08/03/2021   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[place]) 05/26/2019   Acute on chronic respiratory failure with hypoxia ([place]) 05/26/2019   COPD with chronic  bronchitis 05/26/2019   Hyponatremia 05/26/2019   UTI (urinary tract infection) 05/26/2019   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) 05/26/2019   Dysphagia as late effect of cerebrovascular accident (CVA) 05/26/2019   Appetite impaired 08/15/2018   Anorexia 06/05/2018   Weakness generalized 06/05/2018   Esophageal dysphagia    Oropharyngeal dysphagia    Neurological dysfunction    Endotracheal tube present    Palliative care encounter    Acute metabolic encephalopathy    Septic shock ([place]) 04/10/2017   HTN (hypertension) 12/28/2013   PCP:  Patient, No Pcp Per Pharmacy:   [place], [place] - [place] [address] [place] [address] Phone: [phone] Fax: [phone]     Social Determinants of Health (SDOH) Social History: SDOH Screenings   Tobacco Use: Medium Risk (06/24/2022)   SDOH Interventions:     Readmission Risk Interventions    08/14/2021   10:11 AM  Readmission Risk Prevention Plan  Transportation Screening Complete  PCP or Specialist Appt within 3-5 Days Complete  HRI or [place] Complete  Social Work Consult for [place] Planning/Counseling Complete  Medication Review Press photographer) Complete
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
## 2022-06-28 NOTE — NC FL2 (Addendum)
[place] LEVEL OF CARE FORM     IDENTIFICATION  Patient Name: Kaitlyn Ruiz Birthdate: May 27, 1956 Sex: female Admission Date (Current Location): 06/24/2022  [facility] and [place] Number:  Engineering geologist and Address:  [facility], [address] [place], [place] [address]      Provider Number: [id number]  Attending Physician Name and Address:  Emeterio Reeve, DO  Relative Name and Phone Number:       Current Level of Care: Hospital Recommended Level of Care: Nursing Facility Prior Approval Number:    Date Approved/Denied:   PASRR Number: [id number]  Discharge Plan: SNF    Current Diagnoses: Patient Active Problem List   Diagnosis Date Noted   Sepsis due to pneumonia ([place]) 06/24/2022   Aspiration pneumonia ([place]) 06/24/2022   Chronic obstructive pulmonary disease (COPD) ([place]) 06/24/2022   Type 2 diabetes mellitus with peripheral neuropathy ([place]) 06/24/2022   Dyslipidemia 06/24/2022   Seizure disorder ([place]) 06/24/2022   Dislodged gastrostomy tube 06/24/2022   Feeding tube dysfunction 09/03/2021   Sepsis ([place]) 08/27/2021   HCAP,  (healthcare-associated pneumonia) 08/27/2021   Recent mechanical ventilation 3/14-3/15/23 08/27/2021   Chronic hypotension 08/27/2021   Hypernatremia 08/22/2021   MSSA (methicillin susceptible Staphylococcus aureus) pneumonia ([place]) 08/22/2021   Diarrhea 08/22/2021   (HFpEF) heart failure with preserved ejection fraction ([place]) 08/22/2021   History of CVA (cerebrovascular accident) 08/22/2021   Hypokalemia 08/22/2021   Acute respiratory failure with hypoxemia ([place]) 08/11/2021   Cerebral artery occlusion with cerebral infarction ([place]) 08/04/2021   Malfunction of percutaneous endoscopic gastrostomy (PEG) tube ([place]) 08/03/2021   Chronic hepatitis C ([place]) 08/03/2021   Depression 08/03/2021   Tobacco use disorder 08/03/2021   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   Acute on chronic respiratory  failure with hypoxia ([place]) 05/26/2019   COPD with chronic bronchitis 05/26/2019   Hyponatremia 05/26/2019   UTI (urinary tract infection) 05/26/2019   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) 05/26/2019   Dysphagia as late effect of cerebrovascular accident (CVA) 05/26/2019   Appetite impaired 08/15/2018   Anorexia 06/05/2018   Weakness generalized 06/05/2018   Esophageal dysphagia    Oropharyngeal dysphagia    Neurological dysfunction    Endotracheal tube present    Palliative care encounter    Acute metabolic encephalopathy    Septic shock ([place]) 04/10/2017   HTN (hypertension) 12/28/2013    Orientation RESPIRATION BLADDER Height & Weight     Self, Situation  O2 (1L [place]) Incontinent Weight: 110 kg Height:  5\' 6"  (167.6 cm)  BEHAVIORAL SYMPTOMS/MOOD NEUROLOGICAL BOWEL NUTRITION STATUS      Incontinent Feeding tube  AMBULATORY STATUS COMMUNICATION OF NEEDS Skin     Verbally Normal                       Personal Care Assistance Level of Assistance              Functional Limitations Info             SPECIAL CARE FACTORS FREQUENCY                       Contractures Contractures Info: Not present    Additional Factors Info  Code Status, Allergies Code Status info:  DNR Allergies Info: Nkda      Airborne isolation - covid      Current Medications (06/28/2022):  This is the current hospital active medication list Current Facility-Administered Medications  Medication Dose Route Frequency Provider Last Rate Last Admin   acetaminophen (TYLENOL) tablet 650 mg  650 mg Per Tube Q6H PRN Gillis Santa, MD   650 mg at 06/27/22 0522   Or   acetaminophen (TYLENOL) suppository 650 mg  650 mg Rectal Q6H PRN Gillis Santa, MD       Ampicillin-Sulbactam (UNASYN) 3 g in sodium chloride 0.9 % 100 mL IVPB  3 g Intravenous Q6H Gillis Santa, MD   Stopped at 06/28/22 0615   aspirin chewable tablet 81 mg  81 mg Per Tube Daily Mansy, Jan A,  MD   81 mg at 06/28/22 0906   atropine 1 % ophthalmic solution 2 drop  2 drop Sublingual QID PRN Gillis Santa, MD       citalopram (CELEXA) tablet 10 mg  10 mg Per Tube Daily Mansy, Jan A, MD   10 mg at 06/28/22 0906   docusate (COLACE) 50 MG/5ML liquid 100 mg  100 mg Per Tube BID Mansy, Jan A, MD   100 mg at 06/27/22 2110   enoxaparin (LOVENOX) injection 55 mg  0.5 mg/kg Subcutaneous Q24H Mansy, Jan A, MD   55 mg at 06/27/22 1143   feeding supplement (GLUCERNA 1.5 CAL) liquid 1,000 mL  1,000 mL Per Tube Continuous Gillis Santa, MD       feeding supplement (PROSource TF) liquid 45 mL  45 mL Per Tube Daily Mansy, Jan A, MD   45 mL at 06/27/22 1110   fludrocortisone (FLORINEF) tablet 0.05 mg  0.05 mg Per Tube Daily Mansy, Jan A, MD   0.05 mg at 06/28/22 0906   fluticasone (FLOVENT HFA) 44 MCG/ACT inhaler 1 puff  1 puff Inhalation BID Otelia Sergeant, RPH   1 puff at 06/27/22 1947   free water 150 mL  150 mL Per Tube Q6H Mansy, Jan A, MD   150 mL at 06/28/22 0525   gabapentin (NEURONTIN) 250 MG/5ML solution 300 mg  300 mg Per Tube Q8H Mansy, Jan A, MD   300 mg at 06/28/22 0525   guaiFENesin (ROBITUSSIN) 100 MG/5ML liquid 5 mL  5 mL Per Tube QID Gillis Santa, MD   5 mL at 06/28/22 0906   insulin aspart (novoLOG) injection 0-9 Units  0-9 Units Subcutaneous Q4H Mansy, Jan A, MD       Ipratropium-Albuterol (COMBIVENT) respimat 1 puff  1 puff Inhalation QID Sharen Hones, RPH   1 puff at 06/28/22 0913   lactobacillus (FLORANEX/LACTINEX) granules 1 g  1 g Per Tube TID Mansy, Jan A, MD   1 g at 06/28/22 5638   loperamide HCl (IMODIUM) 2 MG/15ML solution 2 mg  2 mg Per Tube QID PRN Mansy, Jan A, MD       magnesium hydroxide (MILK OF MAGNESIA) suspension 30 mL  30 mL Per Tube Daily PRN Gillis Santa, MD       ondansetron [facility]) tablet 4 mg  4 mg Oral Q6H PRN Mansy, Jan A, MD       Or   ondansetron [facility]) injection 4 mg  4 mg Intravenous Q6H PRN Mansy, Jan A, MD       polyethylene glycol (MIRALAX /  GLYCOLAX) packet 17 g  17 g Per Tube Daily PRN Mansy, Jan A, MD       traZODone (DESYREL) tablet 25 mg  25 mg Per Tube QHS PRN Gillis Santa, MD   25 mg at 06/25/22 2134   valproic acid (DEPAKENE) 250 MG/5ML solution 125 mg  125 mg Per Tube BID Mansy, Arvella Merles, MD   125 mg at 06/28/22 0907     Discharge Medications: Please see discharge summary for a list of discharge medications.  Relevant Imaging Results:  Relevant Lab Results:   Additional Information [id number]  Beverly Sessions, RN

## 2022-06-29 LAB — CULTURE, BLOOD (ROUTINE X 2)
Culture: NO GROWTH
Culture: NO GROWTH
Special Requests: ADEQUATE
Special Requests: ADEQUATE

## 2022-07-09 ENCOUNTER — Non-Acute Institutional Stay: Payer: Medicare Other | Admitting: Nurse Practitioner

## 2022-07-09 DIAGNOSIS — Z515 Encounter for palliative care: Secondary | ICD-10-CM

## 2022-07-09 DIAGNOSIS — R0602 Shortness of breath: Secondary | ICD-10-CM

## 2022-07-09 DIAGNOSIS — J449 Chronic obstructive pulmonary disease, unspecified: Secondary | ICD-10-CM

## 2022-07-09 DIAGNOSIS — R634 Abnormal weight loss: Secondary | ICD-10-CM
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
## 2022-07-16 ENCOUNTER — Emergency Department
Admission: EM | Admit: 2022-07-16 | Discharge: 2022-07-16 | Disposition: A | Discharge status: Home / Self Care | Payer: Medicare Other | Attending: Emergency Medicine | Admitting: Emergency Medicine

## 2022-07-16 ENCOUNTER — Encounter: Payer: Self-pay | Admitting: Emergency Medicine

## 2022-07-16 ENCOUNTER — Emergency Department: Payer: Medicare Other

## 2022-07-16 ENCOUNTER — Other Ambulatory Visit: Payer: Self-pay

## 2022-07-16 DIAGNOSIS — E119 Type 2 diabetes mellitus without complications: Secondary | ICD-10-CM | POA: Diagnosis not present

## 2022-07-16 DIAGNOSIS — I5030 Unspecified diastolic (congestive) heart failure: Secondary | ICD-10-CM | POA: Diagnosis not present

## 2022-07-16 DIAGNOSIS — I10 Essential (primary) hypertension: Secondary | ICD-10-CM | POA: Diagnosis not present

## 2022-07-16 DIAGNOSIS — K9423 Gastrostomy malfunction: Secondary | ICD-10-CM | POA: Insufficient documentation

## 2022-07-16 DIAGNOSIS — I110 Hypertensive heart disease with heart failure: Secondary | ICD-10-CM | POA: Insufficient documentation

## 2022-07-16 DIAGNOSIS — J449 Chronic obstructive pulmonary disease, unspecified: Secondary | ICD-10-CM | POA: Insufficient documentation

## 2022-07-16 DIAGNOSIS — F0150 Vascular dementia without behavioral disturbance: Secondary | ICD-10-CM | POA: Insufficient documentation

## 2022-07-16 DIAGNOSIS — Z720 Tobacco use: Secondary | ICD-10-CM | POA: Diagnosis not present
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
## 2022-07-16 NOTE — ED Notes (Signed)
[facility] called for transport to [facility]

## 2022-07-16 NOTE — Discharge Instructions (Signed)
Your PEG tube was replaced with a 14 French tube.
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
## 2022-07-16 NOTE — ED Notes (Signed)
Edwin Dada, RN given report at [facility] and informed pt will be coming back to facility. All questions answers. Pt in NAD at this time, resting in bed quietly.

## 2022-07-16 NOTE — ED Notes (Signed)
Kaitlyn Ruiz from SNF updated on pt and informed pt will be coming back to facility once G tube placement confirmed

## 2022-07-17 ENCOUNTER — Emergency Department
Admission: EM | Admit: 2022-07-17 | Discharge: 2022-07-17 | Disposition: A | Discharge status: Home / Self Care | Payer: Medicare Other | Attending: Emergency Medicine | Admitting: Emergency Medicine

## 2022-07-17 ENCOUNTER — Emergency Department: Payer: Medicare Other

## 2022-07-17 DIAGNOSIS — F0154 Vascular dementia, unspecified severity, with anxiety: Secondary | ICD-10-CM | POA: Insufficient documentation

## 2022-07-17 DIAGNOSIS — K9423 Gastrostomy malfunction: Secondary | ICD-10-CM

## 2022-07-17 MED ORDER — LIDOCAINE HCL URETHRAL/MUCOSAL 2 % EX GEL
1.0000 | Freq: Once | CUTANEOUS | Status: AC
  Administered 2022-07-17: 1 via TOPICAL
  Filled 2022-07-17: qty 6

## 2022-07-17 NOTE — ED Triage Notes (Signed)
Pt to ED for G tube out of place since possibly 7pm last night (noticed today at 1000). From [facility] via [place]. VSS. Pt has yellow DNR form with her. Pt does not respond verbally. Nods to questions.

## 2022-07-17 NOTE — ED Provider Notes (Signed)
[facility] Provider Note    Event Date/Time   First MD Initiated Contact with Patient 07/17/22 1108     (approximate)   History   Chief Complaint g tube displaced   HPI  Kaitlyn Ruiz is a 67 y.o. female with past medical history of stroke, vascular dementia, and PEG tube dependence who presents to the ED for displaced G-tube.  History is limited as patient is nonverbal at baseline.  Staff at [facility] reported to EMS that patient's PEG tube came out sometime overnight last night.  It was reportedly in place around 7 PM but patient was found earlier this morning with the tube out of place.  Facility had attempted to replace the tube but was unsuccessful.  Patient shakes her head "no" when asked if she is any pain.      Physical Exam   Triage Vital Signs: ED Triage Vitals [07/17/22 1113]  Enc Vitals Group     BP 115/62     Pulse Rate 83     Resp 20     Temp 97.6 F (36.4 C)     Temp Source Oral     SpO2 97 %     Weight 242 lb 8.1 oz (110 kg)     Height 5' 6"$  (1.676 m)     Head Circumference      Peak Flow      Pain Score      Pain Loc      Pain Edu?      Excl. in [place]?     Most recent vital signs: Vitals:   07/17/22 1113  BP: 115/62  Pulse: 83  Resp: 20  Temp: 97.6 F (36.4 C)  SpO2: 97%    Constitutional: Alert and oriented. Eyes: Conjunctivae are normal. Head: Atraumatic. Nose: No congestion/rhinnorhea. Mouth/Throat: Mucous membranes are moist.  Cardiovascular: Normal rate, regular rhythm. Grossly normal heart sounds.  2+ radial pulses bilaterally. Respiratory: Normal respiratory effort.  No retractions. Lungs CTAB. Gastrointestinal: Soft and nontender. No distention.  PEG tube site intact, no erythema or warmth noted. Musculoskeletal: No lower extremity tenderness nor edema.  Neurologic: Nonverbal at baseline.  Bilateral upper extremity contractures noted with drooling and aphasia.    ED Results / Procedures /  Treatments   Labs (all labs ordered are listed, but only abnormal results are displayed) Labs Reviewed - No data to display  RADIOLOGY Abdominal x-ray reviewed and interpreted by me with contrast within the stomach and colon, PEG tube appropriately positioned.  PROCEDURES:  Critical Care performed: No  Procedures   MEDICATIONS ORDERED IN ED: Medications  lidocaine (XYLOCAINE) 2 % jelly 1 Application (1 Application Topical Given 07/17/22 1141)     IMPRESSION / MDM / ASSESSMENT AND PLAN / ED COURSE  I reviewed the triage vital signs and the nursing notes.                              68 y.o. female with past medical history of stroke, vascular dementia, and PEG tube dependence who presents to the ED complaining of displaced PEG tube noted earlier this morning at her nursing facility.  [name] is most consistent with acute complicated illness / injury requiring diagnostic workup.  Differential diagnosis includes, but is not limited to, displaced PEG tube, PEG site infection, PEG tube malfunction.  Patient nontoxic-appearing and in no acute distress, vital signs are unremarkable patient.  She has  a benign abdominal exam with no signs of infection at the PEG tube site.  She was seen in the ED yesterday for a similar problem, had PEG tube replaced but contrast was injected into the balloon of the tube.  This likely resulted in rupture of the balloon and displaced tube last night.  Tube was replaced with similar 59 [place] as was placed yesterday, will follow-up abdominal x-ray with contrast to ensure appropriate position.  Abdominal x-ray shows contrast within the stomach and colon, PEG tube not confidently identified but radiology, however this was discussed with Dr. Leonia Reeves of radiology and with contrast exclusively in the lumen, PEG tube is appropriately positioned.  Patient remains comfortable with no pain at this time and is appropriate for discharge back to nursing  facility.      FINAL CLINICAL IMPRESSION(S) / ED DIAGNOSES   Final diagnoses:  PEG tube malfunction ([place])     Rx / DC Orders   ED Discharge Orders     None        Note:  This document was prepared using Dragon voice recognition software and may include unintentional dictation errors.   Blake Divine, MD 07/17/22 1255

## 2022-07-17 NOTE — ED Notes (Signed)
Pt report given to staff member Helene Kelp at [facility]

## 2022-07-17 NOTE — ED Notes (Signed)
Clotted blood was cleaned from  the PEG site. Patient tolerated procedure well. Dr. Charna Archer aware of blood clot at site. Sterile gauze was placed around PEG and patient was changed into a clean gown.  Patient tolerated procedure well.

## 2022-07-17 NOTE — ED Notes (Signed)
Pt groaning and yelling. Was provided with pillow, lights dimmed.

## 2022-08-10 ENCOUNTER — Non-Acute Institutional Stay: Payer: Medicare Other | Admitting: Nurse Practitioner

## 2022-08-10 ENCOUNTER — Encounter: Payer: Self-pay | Admitting: Nurse Practitioner

## 2022-08-10 DIAGNOSIS — Z515 Encounter for palliative care: Secondary | ICD-10-CM

## 2022-08-10 DIAGNOSIS — Z931 Gastrostomy status: Secondary | ICD-10-CM

## 2022-08-10 DIAGNOSIS — R634 Abnormal weight loss: Secondary | ICD-10-CM

## 2022-08-10 DIAGNOSIS — R0602 Shortness of breath: Secondary | ICD-10-CM

## 2022-08-10 DIAGNOSIS — J449 Chronic obstructive pulmonary disease, unspecified: Secondary | ICD-10-CM
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
## 2022-09-27 ENCOUNTER — Non-Acute Institutional Stay: Payer: Medicare Other | Admitting: Nurse Practitioner

## 2022-09-27 DIAGNOSIS — Z515 Encounter for palliative care: Secondary | ICD-10-CM

## 2022-09-27 DIAGNOSIS — J449 Chronic obstructive pulmonary disease, unspecified: Secondary | ICD-10-CM

## 2022-09-27 DIAGNOSIS — R0602 Shortness of breath: Secondary | ICD-10-CM
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
## 2022-11-04 ENCOUNTER — Emergency Department
Admission: EM | Admit: 2022-11-04 | Discharge: 2022-11-05 | Disposition: A | Discharge status: Home / Self Care | Payer: 59 | Attending: Emergency Medicine | Admitting: Emergency Medicine

## 2022-11-04 ENCOUNTER — Emergency Department: Payer: 59

## 2022-11-04 ENCOUNTER — Encounter: Payer: Self-pay | Admitting: Emergency Medicine

## 2022-11-04 DIAGNOSIS — Z8673 Personal history of transient ischemic attack (TIA), and cerebral infarction without residual deficits: Secondary | ICD-10-CM | POA: Diagnosis not present

## 2022-11-04 DIAGNOSIS — T85528A Displacement of other gastrointestinal prosthetic devices, implants and grafts, initial encounter: Secondary | ICD-10-CM | POA: Diagnosis present

## 2022-11-04 DIAGNOSIS — Y732 Prosthetic and other implants, materials and accessory gastroenterology and urology devices associated with adverse incidents: Secondary | ICD-10-CM | POA: Insufficient documentation

## 2022-11-04 DIAGNOSIS — E119 Type 2 diabetes mellitus without complications: Secondary | ICD-10-CM | POA: Insufficient documentation

## 2022-11-04 DIAGNOSIS — I10 Essential (primary) hypertension: Secondary | ICD-10-CM | POA: Insufficient documentation

## 2022-11-04 DIAGNOSIS — K9423 Gastrostomy malfunction: Secondary | ICD-10-CM

## 2022-11-04 NOTE — ED Triage Notes (Addendum)
Pt presents via [facility] from [facility] for G-tube removal. Unsure when it came out per facility, Pt is nonverbal at baseline - however denies pain. Pt had a 16f Gtube in place, per facility. Denies N/V/D, CP, or SOB.
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
## 2022-11-04 NOTE — ED Notes (Signed)
Waiting on [facility] to arrive ?

## 2022-11-12 ENCOUNTER — Non-Acute Institutional Stay: Payer: Medicare Other | Admitting: Nurse Practitioner

## 2022-11-12 DIAGNOSIS — R0602 Shortness of breath: Secondary | ICD-10-CM

## 2022-11-12 DIAGNOSIS — R634 Abnormal weight loss: Secondary | ICD-10-CM

## 2022-11-12 DIAGNOSIS — Z515 Encounter for palliative care: Secondary | ICD-10-CM

## 2022-11-12 DIAGNOSIS — J449 Chronic obstructive pulmonary disease, unspecified: Secondary | ICD-10-CM
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
## 2022-12-18 ENCOUNTER — Other Ambulatory Visit: Payer: Self-pay

## 2022-12-18 ENCOUNTER — Observation Stay: Payer: Medicare Other

## 2022-12-18 ENCOUNTER — Observation Stay
Admission: EM | Admit: 2022-12-18 | Discharge: 2022-12-20 | Disposition: A | Discharge status: Skilled Nursing Facility | Payer: Medicare Other | Attending: Emergency Medicine | Admitting: Emergency Medicine

## 2022-12-18 DIAGNOSIS — J449 Chronic obstructive pulmonary disease, unspecified: Secondary | ICD-10-CM | POA: Diagnosis present

## 2022-12-18 DIAGNOSIS — I110 Hypertensive heart disease with heart failure: Secondary | ICD-10-CM | POA: Diagnosis not present

## 2022-12-18 DIAGNOSIS — Z8673 Personal history of transient ischemic attack (TIA), and cerebral infarction without residual deficits: Secondary | ICD-10-CM | POA: Diagnosis not present

## 2022-12-18 DIAGNOSIS — R058 Other specified cough: Secondary | ICD-10-CM

## 2022-12-18 DIAGNOSIS — Z79899 Other long term (current) drug therapy: Secondary | ICD-10-CM | POA: Diagnosis not present

## 2022-12-18 DIAGNOSIS — Z945 Skin transplant status: Secondary | ICD-10-CM | POA: Insufficient documentation

## 2022-12-18 DIAGNOSIS — G40909 Epilepsy, unspecified, not intractable, without status epilepticus: Secondary | ICD-10-CM | POA: Diagnosis not present

## 2022-12-18 DIAGNOSIS — R4701 Aphasia: Secondary | ICD-10-CM | POA: Diagnosis not present

## 2022-12-18 DIAGNOSIS — Y732 Prosthetic and other implants, materials and accessory gastroenterology and urology devices associated with adverse incidents: Secondary | ICD-10-CM | POA: Diagnosis not present

## 2022-12-18 DIAGNOSIS — R059 Cough, unspecified: Secondary | ICD-10-CM | POA: Insufficient documentation

## 2022-12-18 DIAGNOSIS — R1310 Dysphagia, unspecified: Secondary | ICD-10-CM | POA: Diagnosis not present

## 2022-12-18 DIAGNOSIS — I5030 Unspecified diastolic (congestive) heart failure: Secondary | ICD-10-CM | POA: Diagnosis present

## 2022-12-18 DIAGNOSIS — T85528A Displacement of other gastrointestinal prosthetic devices, implants and grafts, initial encounter: Principal | ICD-10-CM

## 2022-12-18 DIAGNOSIS — T300 Burn of unspecified body region, unspecified degree: Secondary | ICD-10-CM

## 2022-12-18 DIAGNOSIS — I5032 Chronic diastolic (congestive) heart failure: Secondary | ICD-10-CM | POA: Diagnosis not present

## 2022-12-18 DIAGNOSIS — F0150 Vascular dementia without behavioral disturbance: Secondary | ICD-10-CM | POA: Diagnosis not present

## 2022-12-18 DIAGNOSIS — Z87891 Personal history of nicotine dependence: Secondary | ICD-10-CM | POA: Diagnosis not present

## 2022-12-18 DIAGNOSIS — E1142 Type 2 diabetes mellitus with diabetic polyneuropathy: Secondary | ICD-10-CM | POA: Diagnosis present

## 2022-12-18 DIAGNOSIS — I9589 Other hypotension: Secondary | ICD-10-CM | POA: Diagnosis present

## 2022-12-18 DIAGNOSIS — I10 Essential (primary) hypertension: Secondary | ICD-10-CM | POA: Diagnosis present

## 2022-12-18 DIAGNOSIS — E1140 Type 2 diabetes mellitus with diabetic neuropathy, unspecified: Secondary | ICD-10-CM | POA: Insufficient documentation

## 2022-12-18 LAB — CBC
HCT: 44.6 % (ref 36.0–46.0)
Hemoglobin: 15.3 g/dL — ABNORMAL HIGH (ref 12.0–15.0)
MCH: 30.5 pg (ref 26.0–34.0)
MCHC: 34.3 g/dL (ref 30.0–36.0)
MCV: 88.8 fL (ref 80.0–100.0)
Platelets: 171 10*3/uL (ref 150–400)
RBC: 5.02 MIL/uL (ref 3.87–5.11)
RDW: 13 % (ref 11.5–15.5)
WBC: 8.3 10*3/uL (ref 4.0–10.5)
nRBC: 0 % (ref 0.0–0.2)

## 2022-12-18 LAB — COMPREHENSIVE METABOLIC PANEL
ALT: 49 U/L — ABNORMAL HIGH (ref 0–44)
AST: 92 U/L — ABNORMAL HIGH (ref 15–41)
Albumin: 3.1 g/dL — ABNORMAL LOW (ref 3.5–5.0)
Alkaline Phosphatase: 135 U/L — ABNORMAL HIGH (ref 38–126)
Anion gap: 8 (ref 5–15)
BUN: 12 mg/dL (ref 8–23)
CO2: 23 mmol/L (ref 22–32)
Calcium: 9 mg/dL (ref 8.9–10.3)
Chloride: 97 mmol/L — ABNORMAL LOW (ref 98–111)
Creatinine, Ser: 0.53 mg/dL (ref 0.44–1.00)
GFR, Estimated: >60 mL/min (ref >60)
Glucose, Bld: 76 mg/dL (ref 70–99)
Potassium: 4.7 mmol/L (ref 3.5–5.1)
Sodium: 128 mmol/L — ABNORMAL LOW (ref 135–145)
Total Bilirubin: 1.3 mg/dL — ABNORMAL HIGH (ref 0.3–1.2)
Total Protein: 8.4 g/dL — ABNORMAL HIGH (ref 6.5–8.1)

## 2022-12-18 MED ORDER — FREE WATER
150.0000 mL | Freq: Four times a day (QID) | Status: DC
  Administered 2022-12-19 – 2022-12-20 (×4): 150 mL

## 2022-12-18 MED ORDER — LACTATED RINGERS IV SOLN: INTRAVENOUS | Status: AC

## 2022-12-18 MED ORDER — ACETAMINOPHEN 325 MG PO TABS: 650.0000 mg | ORAL_TABLET | Freq: Four times a day (QID) | ORAL | Status: DC | PRN

## 2022-12-18 MED ORDER — FLUDROCORTISONE ACETATE 0.1 MG PO TABS
0.0500 mg | ORAL_TABLET | Freq: Every day | ORAL | Status: DC
  Administered 2022-12-19: 0.05 mg
  Filled 2022-12-18 (×3): qty 0.5

## 2022-12-18 MED ORDER — ACETAMINOPHEN 160 MG/5ML PO SOLN: 650.0000 mg | Freq: Four times a day (QID) | ORAL | Status: DC | PRN

## 2022-12-18 MED ORDER — LIDOCAINE HCL URETHRAL/MUCOSAL 2 % EX GEL
1.0000 | Freq: Once | CUTANEOUS | Status: AC
  Administered 2022-12-18: 1
  Filled 2022-12-18: qty 6

## 2022-12-18 MED ORDER — VALPROIC ACID 250 MG/5ML PO SOLN
125.0000 mg | Freq: Two times a day (BID) | ORAL | Status: DC
  Administered 2022-12-19 (×2): 125 mg
  Filled 2022-12-18 (×4): qty 5

## 2022-12-18 MED ORDER — ACETAMINOPHEN 650 MG RE SUPP: 650.0000 mg | Freq: Four times a day (QID) | RECTAL | Status: DC | PRN

## 2022-12-18 MED ORDER — BENZONATATE 100 MG PO CAPS: 100.0000 mg | ORAL_CAPSULE | Freq: Three times a day (TID) | ORAL | Status: DC

## 2022-12-18 MED ORDER — CITALOPRAM HYDROBROMIDE 20 MG PO TABS
10.0000 mg | ORAL_TABLET | Freq: Every day | ORAL | Status: DC
  Administered 2022-12-19: 10 mg
  Filled 2022-12-18: qty 1

## 2022-12-18 MED ORDER — WHITE PETROLATUM EX OINT
1.0000 | TOPICAL_OINTMENT | Freq: Three times a day (TID) | CUTANEOUS | Status: DC
  Administered 2022-12-19 (×3): 1 via TOPICAL
  Filled 2022-12-18 (×7): qty 5

## 2022-12-18 MED ORDER — ASPIRIN 81 MG PO CHEW
81.0000 mg | CHEWABLE_TABLET | Freq: Every day | ORAL | Status: DC
  Administered 2022-12-19: 81 mg
  Filled 2022-12-18: qty 1

## 2022-12-18 MED ORDER — SCOPOLAMINE 1 MG/3DAYS TD PT72
1.0000 | MEDICATED_PATCH | TRANSDERMAL | Status: DC
  Administered 2022-12-19: 1.5 mg via TRANSDERMAL
  Filled 2022-12-18: qty 1

## 2022-12-18 MED ORDER — RISAQUAD PO CAPS
1.0000 | ORAL_CAPSULE | Freq: Three times a day (TID) | ORAL | Status: DC
  Administered 2022-12-19 (×3): 1 via ORAL
  Filled 2022-12-18 (×3): qty 1

## 2022-12-18 MED ORDER — LISINOPRIL 5 MG PO TABS
2.5000 mg | ORAL_TABLET | Freq: Every day | ORAL | Status: DC
  Administered 2022-12-19: 2.5 mg
  Filled 2022-12-18 (×3): qty 0.5

## 2022-12-18 MED ORDER — IPRATROPIUM-ALBUTEROL 0.5-2.5 (3) MG/3ML IN SOLN: 3.0000 mL | RESPIRATORY_TRACT | Status: DC | PRN

## 2022-12-18 MED ORDER — ONDANSETRON HCL 4 MG/2ML IJ SOLN: 4.0000 mg | Freq: Four times a day (QID) | INTRAMUSCULAR | Status: DC | PRN

## 2022-12-18 MED ORDER — GABAPENTIN 250 MG/5ML PO SOLN
300.0000 mg | Freq: Three times a day (TID) | ORAL | Status: DC
  Administered 2022-12-19 – 2022-12-20 (×3): 300 mg
  Filled 2022-12-18 (×8): qty 6

## 2022-12-18 MED ORDER — GUAIFENESIN ER 600 MG PO TB12: 600.0000 mg | ORAL_TABLET | Freq: Two times a day (BID) | ORAL | Status: DC

## 2022-12-18 MED ORDER — POLYETHYLENE GLYCOL 3350 17 G PO PACK: 17.0000 g | PACK | Freq: Every day | ORAL | Status: DC | PRN

## 2022-12-18 MED ORDER — ONDANSETRON HCL 4 MG PO TABS: 4.0000 mg | ORAL_TABLET | Freq: Four times a day (QID) | ORAL | Status: DC | PRN

## 2022-12-18 MED ORDER — PRAVASTATIN SODIUM 20 MG PO TABS
20.0000 mg | ORAL_TABLET | Freq: Every day | ORAL | Status: DC
  Administered 2022-12-19: 20 mg
  Filled 2022-12-18: qty 1

## 2022-12-18 NOTE — Assessment & Plan Note (Addendum)
-   Continue home regimen once G-tube is functional

## 2022-12-18 NOTE — ED Triage Notes (Signed)
Pt from [facility] care, staff found g tube was out this morning and called EMS. No sign of distress from pt

## 2022-12-18 NOTE — ED Provider Notes (Signed)
[facility] Provider Note    Event Date/Time   First MD Initiated Contact with Patient 12/18/22 [phone]     (approximate)   History   G tube   HPI  Kaitlyn Ruiz is a 67 y.o. female with history of COPD, hyponatremia, chronic hepatitis, hypertension, CVA, seizure disorder, type 2 diabetes and as listed in EMR presents to the emergency department for replacement of G-tube.  Nursing facility staff found that it was dislodged this morning.  Patient is unable to communicate history or symptoms of concern due to baseline aphasia.      Physical Exam   Triage Vital Signs: ED Triage Vitals  Encounter Vitals Group     BP      Systolic BP Percentile      Diastolic BP Percentile      Pulse      Resp      Temp      Temp src      SpO2      Weight      Height      Head Circumference      Peak Flow      Pain Score      Pain Loc      Pain Education      Exclude from Growth Chart     Most recent vital signs: Vitals:   12/18/22 0917 12/18/22 1613  BP: 133/61 (!) 123/56  Pulse: 70 71  Resp: 15 16  Temp: 98.3 F (36.8 C) 98.3 F (36.8 C)  SpO2: 96% 94%    General: Awake, no distress.  CV:  Good peripheral perfusion.  Resp:  Normal effort.  Abd:  No distention.  Other:  Abdomen is soft.  G-tube site superior to the umbilicus noted with scant amount of bleeding and dried blood surrounding the area.   ED Results / Procedures / Treatments   Labs (all labs ordered are listed, but only abnormal results are displayed) Labs Reviewed  COMPREHENSIVE METABOLIC PANEL     EKG  Not indicated   RADIOLOGY  Image and radiology report reviewed and interpreted by me. Radiology report consistent with the same.  Not indicated.  PROCEDURES:  Critical Care performed: No  Procedures   MEDICATIONS ORDERED IN ED:  Medications  lidocaine (XYLOCAINE) 2 % jelly 1 Application (1 Application Other Given 12/18/22 1330)     IMPRESSION / MDM /  ASSESSMENT AND PLAN / ED COURSE   I have reviewed the triage note.  Differential diagnosis includes, but is not limited to, displaced G-tube  [name] is most consistent with acute complicated illness / injury requiring diagnostic workup.  67 year old female presenting to the emergency department for treatment and evaluation after G-tube dislodged sometime between yesterday evening and this morning.  See HPI for further details.  Based on review of ED note from November 04, 2022 G-tube was replaced with some difficulty.  ED provider was able to insert 14-gauge after dilating site with 12-gauge NG tube.  Same process as above was attempted without success.  There is some bleeding from the site of the G-tube.  Gauze and pressure applied and hemostasis achieved.  ED attending, Dr. Modesto Charon, who had replaced it earlier in the year has agreed to, and reattempt.  Dr. Modesto Charon also unable to reinsert Gtube. I was able to get an 36F in and out foley tube to pass with immediate return of stomach contents. This is now taped in place. Plan will be to try  and find a smaller Gtube.  No smaller Gtube in supply, OR, or women's and children floor. I was able to get a 50F foley in and attempted to dilate the opening and exchange with the 24F Gtube. 2 attempts made without success. 50F foley left in place and connected to foley tubing.   Discussed with Dr. Tonna Boehringer with general surgery.  He recommends admission and IR consult if Gtube can not be replaced.   Charge nurse and tech trying again tried find smaller Gtube without success. Plan will be to get some screening labs and call hospitalist for observation admission until IR is available to replace it.      FINAL CLINICAL IMPRESSION(S) / ED DIAGNOSES   Final diagnoses:  Dislodged gastrostomy tube     Rx / DC Orders   ED Discharge Orders     None        Note:  This document was prepared using Dragon voice recognition software and may include  unintentional dictation errors.   [name], FNP 12/18/22 1615    Jene Every, MD 12/18/22 1840

## 2022-12-18 NOTE — Assessment & Plan Note (Signed)
Patient endorses a new productive cough that began today.  No shortness of breath noted.  At risk for chronic aspiration given dysphagia, PEG tube dependent.  - Continuous pulse oximetry - Chest x-ray ordered - Mucinex and Tessalon Perles as needed

## 2022-12-18 NOTE — Assessment & Plan Note (Addendum)
Diet controlled at this time.  Patient does not appear to be on any antiglycemic agents at home.  Last A1c of 5.6%.  No indication for SSI at this time  - Continue home gabapentin once G-tube is functional
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
## 2022-12-18 NOTE — Assessment & Plan Note (Signed)
Patient reporting increased cough but no shortness of breath.  No wheezing on examination.  - DuoNebs as needed

## 2022-12-18 NOTE — ED Notes (Signed)
Supply chain was contacted about bringing a smaller GT tube than a 14 Fr. Supply chain was shown what to look for and said she would be back if they have it.

## 2022-12-18 NOTE — Assessment & Plan Note (Addendum)
Patient presenting after her G-tube was dislodged this morning.  A Foley catheter has been able to be placed with return of gastric contents, however unable to place previous 14 [place].  - IR consulted; appreciate their recommendations - N.p.o. - Hold off on tube feeds until replacement - IV fluids as needed to maintain hydration
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
## 2022-12-18 NOTE — Progress Notes (Signed)
Pt arrived onto unit. Pt alert to self place and month. Respirations even and unlabored on RA. Pt has no pain at this time. IV fluids started. Vital signs stable. Pt has no complaints at this time

## 2022-12-18 NOTE — Assessment & Plan Note (Signed)
Patient appears euvolemic at this time.

## 2022-12-18 NOTE — ED Notes (Signed)
Transport called for pt to go to rm 211.  Spoke with Talbert Forest

## 2022-12-19 DIAGNOSIS — T85528A Displacement of other gastrointestinal prosthetic devices, implants and grafts, initial encounter: Principal | ICD-10-CM

## 2022-12-19 LAB — COMPREHENSIVE METABOLIC PANEL
ALT: 55 U/L — ABNORMAL HIGH (ref 0–44)
AST: 93 U/L — ABNORMAL HIGH (ref 15–41)
Albumin: 3.2 g/dL — ABNORMAL LOW (ref 3.5–5.0)
Alkaline Phosphatase: 138 U/L — ABNORMAL HIGH (ref 38–126)
Anion gap: 7 (ref 5–15)
BUN: 10 mg/dL (ref 8–23)
CO2: 28 mmol/L (ref 22–32)
Calcium: 9.1 mg/dL (ref 8.9–10.3)
Chloride: 98 mmol/L (ref 98–111)
Creatinine, Ser: 0.54 mg/dL (ref 0.44–1.00)
GFR, Estimated: >60 mL/min (ref >60)
Glucose, Bld: 68 mg/dL — ABNORMAL LOW (ref 70–99)
Potassium: 4.6 mmol/L (ref 3.5–5.1)
Sodium: 133 mmol/L — ABNORMAL LOW (ref 135–145)
Total Bilirubin: 0.9 mg/dL (ref 0.3–1.2)
Total Protein: 8.9 g/dL — ABNORMAL HIGH (ref 6.5–8.1)

## 2022-12-19 LAB — CBC
HCT: 42.8 % (ref 36.0–46.0)
Hemoglobin: 14.9 g/dL (ref 12.0–15.0)
MCH: 30.4 pg (ref 26.0–34.0)
MCHC: 34.8 g/dL (ref 30.0–36.0)
MCV: 87.3 fL (ref 80.0–100.0)
Platelets: 152 10*3/uL (ref 150–400)
RBC: 4.9 MIL/uL (ref 3.87–5.11)
RDW: 12.9 % (ref 11.5–15.5)
WBC: 7.7 10*3/uL (ref 4.0–10.5)
nRBC: 0 % (ref 0.0–0.2)

## 2022-12-19 LAB — VALPROIC ACID LEVEL: Valproic Acid Lvl: 29 ug/mL — ABNORMAL LOW (ref 50.0–100.0)

## 2022-12-19 LAB — HIV ANTIBODY (ROUTINE TESTING W REFLEX): HIV Screen 4th Generation wRfx: NONREACTIVE

## 2022-12-19 MED ORDER — ENOXAPARIN SODIUM 40 MG/0.4ML IJ SOSY
40.0000 mg | PREFILLED_SYRINGE | INTRAMUSCULAR | Status: DC
  Administered 2022-12-19: 40 mg via SUBCUTANEOUS
  Filled 2022-12-19: qty 0.4

## 2022-12-19 MED ORDER — SODIUM CHLORIDE 0.9 % IV SOLN: INTRAVENOUS | Status: DC

## 2022-12-19 NOTE — Plan of Care (Signed)
  Problem: Clinical Measurements: Goal: Will remain free from infection Outcome: Progressing   Problem: Clinical Measurements: Goal: Diagnostic test results will improve Outcome: Progressing   Problem: Clinical Measurements: Goal: Respiratory complications will improve Outcome: Progressing   
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
## 2022-12-20 ENCOUNTER — Observation Stay: Payer: Medicare Other | Admitting: Radiology

## 2022-12-20 ENCOUNTER — Observation Stay: Payer: Medicare Other

## 2022-12-20 DIAGNOSIS — T85528A Displacement of other gastrointestinal prosthetic devices, implants and grafts, initial encounter: Secondary | ICD-10-CM | POA: Diagnosis not present

## 2022-12-20 HISTORY — PX: IR REPLACE G-TUBE SIMPLE WO FLUORO: IMG2323

## 2022-12-20 LAB — COMPREHENSIVE METABOLIC PANEL
ALT: 47 U/L — ABNORMAL HIGH (ref 0–44)
AST: 87 U/L — ABNORMAL HIGH (ref 15–41)
Albumin: 2.7 g/dL — ABNORMAL LOW (ref 3.5–5.0)
Alkaline Phosphatase: 122 U/L (ref 38–126)
Anion gap: 12 (ref 5–15)
BUN: 10 mg/dL (ref 8–23)
CO2: 19 mmol/L — ABNORMAL LOW (ref 22–32)
Calcium: 8.5 mg/dL — ABNORMAL LOW (ref 8.9–10.3)
Chloride: 101 mmol/L (ref 98–111)
Creatinine, Ser: 0.54 mg/dL (ref 0.44–1.00)
GFR, Estimated: >60 mL/min (ref >60)
Glucose, Bld: 49 mg/dL — ABNORMAL LOW (ref 70–99)
Potassium: 4.6 mmol/L (ref 3.5–5.1)
Sodium: 132 mmol/L — ABNORMAL LOW (ref 135–145)
Total Bilirubin: 0.8 mg/dL (ref 0.3–1.2)
Total Protein: 7.8 g/dL (ref 6.5–8.1)

## 2022-12-20 LAB — GLUCOSE, CAPILLARY
Glucose-Capillary: 55 mg/dL — ABNORMAL LOW (ref 70–99)
Glucose-Capillary: 145 mg/dL — ABNORMAL HIGH (ref 70–99)

## 2022-12-20 MED ORDER — DIATRIZOATE MEGLUMINE & SODIUM 66-10 % PO SOLN: 30.0000 mL | Freq: Once | ORAL | Status: DC

## 2022-12-20 MED ORDER — DEXTROSE 50 % IV SOLN
INTRAVENOUS | Status: AC
  Filled 2022-12-20: qty 50

## 2022-12-20 MED ORDER — DIATRIZOATE MEGLUMINE & SODIUM 66-10 % PO SOLN
30.0000 mL | Freq: Once | ORAL | Status: AC
  Administered 2022-12-20: 15 mL

## 2022-12-20 MED ORDER — DEXTROSE 50 % IV SOLN
25.0000 g | INTRAVENOUS | Status: AC
  Administered 2022-12-20: 25 g via INTRAVENOUS
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
## 2023-02-14 ENCOUNTER — Emergency Department: Payer: Medicare Other

## 2023-02-14 ENCOUNTER — Emergency Department
Admission: EM | Admit: 2023-02-14 | Discharge: 2023-02-14 | Disposition: A | Discharge status: Home / Self Care | Payer: Medicare Other | Attending: Emergency Medicine | Admitting: Emergency Medicine

## 2023-02-14 ENCOUNTER — Other Ambulatory Visit: Payer: Self-pay

## 2023-02-14 DIAGNOSIS — F0390 Unspecified dementia without behavioral disturbance: Secondary | ICD-10-CM | POA: Diagnosis not present

## 2023-02-14 DIAGNOSIS — Z931 Gastrostomy status: Secondary | ICD-10-CM | POA: Insufficient documentation

## 2023-02-14 DIAGNOSIS — E119 Type 2 diabetes mellitus without complications: Secondary | ICD-10-CM | POA: Insufficient documentation

## 2023-02-14 NOTE — ED Provider Notes (Signed)
   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 Patient presents from [facility].  NP there reported that patient had her G-tube replaced, but they were unable to confirm placement.  Patient tells me that this was changed 2 days ago.  She denies any abdominal pain.  Denies other complaints.      Physical Exam   Triage Vital Signs: ED Triage Vitals [02/14/23 2056]  Encounter Vitals Group     BP (!) 140/76     Systolic BP Percentile      Diastolic BP Percentile      Pulse Rate 86     Resp 16     Temp 98.4 F (36.9 C)     Temp Source Oral     SpO2 98 %     Weight 180 lb (81.6 kg)     Height 5\' 5"  (1.651 m)     Head Circumference      Peak Flow      Pain Score 0     Pain Loc      Pain Education      Exclude from Growth Chart     Most recent vital signs: Vitals:   02/14/23 2056 02/14/23 2247  BP: (!) 140/76 (!) 141/74  Pulse: 86 88  Resp: 16 18  Temp: 98.4 F (36.9 C) 98.4 F (36.9 C)  SpO2: 98% 97%     General: Awake, interactive CV:  Regular rate, good peripheral perfusion.  Resp:  Lungs clear, unlabored respirations.  Abd:  Soft, nondistended, G-tube in place without surrounding drainage, erythema Neuro:  Aphasic, but able to nod yes and no to questions appropriately, moving extremity spontaneously   ED Results / Procedures / Treatments   Labs (all labs ordered are listed, but only abnormal results are displayed) Labs Reviewed - No data to display   EKG EKG independently reviewed interpreted by myself (ER attending) demonstrates:    RADIOLOGY Imaging independently reviewed and  interpreted by myself demonstrates:  Abdominal x-Wray Goehring demonstrates tip of G-tube in the stomach without extravasation  PROCEDURES:  Critical Care performed: No  Procedures   MEDICATIONS ORDERED IN ED: Medications - No data to display   IMPRESSION / MDM / ASSESSMENT AND PLAN / ED COURSE  I reviewed the triage vital signs and the nursing notes.  Differential diagnosis includes, but is not limited to, appropriately placed G-tube, complication of recent placement  [name] is most consistent with acute, uncomplicated illness.  67 year old here to confirm recent G-tube placement.  X-Taiana Temkin ordered demonstrating appropriate positioning.  Patient discharged in stable condition.      FINAL CLINICAL IMPRESSION(S) / ED DIAGNOSES   Final diagnoses:  Gastrostomy tube in place [facility])     Rx / DC Orders   ED Discharge Orders     None        Note:  This document was prepared using Dragon voice recognition software and may include unintentional dictation errors.   Trinna Post, MD 02/14/23 [phone]

## 2023-02-14 NOTE — Discharge Instructions (Signed)
Your x-Lolitha Tortora showed appropriate positioning of your G-tube.  Keep your scheduled follow-up as directed.  Return to the ER for new or worsening symptoms.

## 2023-02-14 NOTE — ED Triage Notes (Signed)
Pt to ED via EMS from [facility], per facility pt had G tube placed and they need to verify placement. Pt denies any complaints.

## 2023-02-14 NOTE — ED Triage Notes (Signed)
[facility] NP REPORT: PT HAD G-TUBE REPLACED WITH FOLEY CATH AND FACILITY NOT ABLE TO CONFIRM PLACEMENT. PT SENT TO ED FOR CONFIRMATION OF PROPER PLACEMENT OF G-TUBE.

## 2023-02-14 NOTE — ED Notes (Signed)
Pts brief cleaned at this time

## 2023-02-25 ENCOUNTER — Other Ambulatory Visit: Payer: Self-pay

## 2023-02-25 ENCOUNTER — Emergency Department
Admission: EM | Admit: 2023-02-25 | Discharge: 2023-02-25 | Disposition: A | Discharge status: Home / Self Care | Payer: Medicare Other | Attending: Emergency Medicine | Admitting: Emergency Medicine

## 2023-02-25 DIAGNOSIS — K9423 Gastrostomy malfunction: Secondary | ICD-10-CM | POA: Diagnosis present

## 2023-02-25 DIAGNOSIS — T85528A Displacement of other gastrointestinal prosthetic devices, implants and grafts, initial encounter: Secondary | ICD-10-CM

## 2023-02-25 NOTE — ED Notes (Signed)
Placed a gauze around G tube to protect skin.

## 2023-02-25 NOTE — ED Notes (Signed)
Provider at bedside

## 2023-02-25 NOTE — ED Triage Notes (Signed)
Pt bib [facility] from [facility] facility. Facility reports removing the G tube and they do not have replacement tubes at their facility. No acute distress noted.

## 2023-02-25 NOTE — Discharge Instructions (Addendum)
A new 14-french Kangaroo PEG tube was placed.

## 2023-02-25 NOTE — ED Provider Notes (Signed)
   [facility] Provider Note    Event Date/Time   First MD Initiated Contact with Patient 02/25/23 1510     (approximate)   History   G tube Replacement   HPI  Kaitlyn Ruiz is a 67 y.o. female sent to the ED after her SNF removed her current PEG tube in tending to replace it, but they did not have replacement supplies, so they left the gastrostomy stoma open.  Sent to ED for PEG tube replacement.     Physical Exam   Triage Vital Signs: ED Triage Vitals  Encounter Vitals Group     BP 02/25/23 1511 (!) 116/58     Systolic BP Percentile --      Diastolic BP Percentile --      Pulse --      Resp --      Temp 02/25/23 1511 97.6 F (36.4 C)     Temp Source 02/25/23 1511 Axillary     SpO2 02/25/23 1511 97 %     Weight 02/25/23 1513 179 lb 14.3 oz (81.6 kg)     Height 02/25/23 1513 5\' 5"  (1.651 m)     Head Circumference --      Peak Flow --      Pain Score --      Pain Loc --      Pain Education --      Exclude from Growth Chart --     Most recent vital signs: Vitals:   02/25/23 1511  BP: (!) 116/58  Temp: 97.6 F (36.4 C)  SpO2: 97%    General: Awake, no distress.  CV:  Good peripheral perfusion.  Resp:  Normal effort.  Abd:  No distention.  Soft nontender    ED Results / Procedures / Treatments   Labs (all labs ordered are listed, but only abnormal results are displayed) Labs Reviewed - No data to display   RADIOLOGY    PROCEDURES:  Gastrostomy tube replacement  Date/Time: 02/25/2023 3:22 PM  Performed by: Sharman Cheek, MD Authorized by: Sharman Cheek, MD  Consent: The procedure was performed in an emergent situation. Patient identity confirmed: arm band Preparation: Patient was prepped and draped in the usual sterile fashion. Local anesthesia used: no  Anesthesia: Local anesthesia used: no  Sedation: Patient sedated: no  Patient tolerance: patient tolerated the procedure well with no immediate  complications      MEDICATIONS ORDERED IN ED: Medications - No data to display   IMPRESSION / MDM / ASSESSMENT AND PLAN / ED COURSE  I reviewed the triage vital signs and the nursing notes.                             Patient sent to the ED due to her PEG tube being removed.  New PEG tube was placed by me, 14 [place], 5 mL in the balloon, uncomplicated.       FINAL CLINICAL IMPRESSION(S) / ED DIAGNOSES   Final diagnoses:  Dislodged gastrostomy tube     Rx / DC Orders   ED Discharge Orders     None        Note:  This document was prepared using Dragon voice recognition software and may include unintentional dictation errors.   Sharman Cheek, MD 02/25/23 [phone]

## 2023-04-04 ENCOUNTER — Inpatient Hospital Stay: Payer: Medicare Other

## 2023-04-04 ENCOUNTER — Ambulatory Visit: Payer: Medicare Other

## 2023-04-04 ENCOUNTER — Other Ambulatory Visit: Payer: Self-pay

## 2023-04-04 ENCOUNTER — Emergency Department: Payer: Medicare Other

## 2023-04-04 ENCOUNTER — Inpatient Hospital Stay
Admission: EM | Admit: 2023-04-04 | Discharge: 2023-04-06 | DRG: 065 | Disposition: A | Discharge status: Other Acute Inpatient Hospital | Payer: Medicare Other | Source: Skilled Nursing Facility | Attending: Student | Admitting: Student

## 2023-04-04 DIAGNOSIS — Z8673 Personal history of transient ischemic attack (TIA), and cerebral infarction without residual deficits: Secondary | ICD-10-CM

## 2023-04-04 DIAGNOSIS — R569 Unspecified convulsions: Secondary | ICD-10-CM

## 2023-04-04 DIAGNOSIS — G40909 Epilepsy, unspecified, not intractable, without status epilepticus: Secondary | ICD-10-CM

## 2023-04-04 DIAGNOSIS — G40901 Epilepsy, unspecified, not intractable, with status epilepticus: Secondary | ICD-10-CM | POA: Diagnosis present

## 2023-04-04 DIAGNOSIS — Z931 Gastrostomy status: Secondary | ICD-10-CM

## 2023-04-04 DIAGNOSIS — J449 Chronic obstructive pulmonary disease, unspecified: Secondary | ICD-10-CM | POA: Diagnosis present

## 2023-04-04 DIAGNOSIS — R748 Abnormal levels of other serum enzymes: Secondary | ICD-10-CM | POA: Diagnosis present

## 2023-04-04 DIAGNOSIS — E119 Type 2 diabetes mellitus without complications: Secondary | ICD-10-CM | POA: Diagnosis present

## 2023-04-04 DIAGNOSIS — R29728 NIHSS score 28: Secondary | ICD-10-CM | POA: Diagnosis present

## 2023-04-04 DIAGNOSIS — F0390 Unspecified dementia without behavioral disturbance: Secondary | ICD-10-CM | POA: Diagnosis present

## 2023-04-04 DIAGNOSIS — I619 Nontraumatic intracerebral hemorrhage, unspecified: Secondary | ICD-10-CM | POA: Diagnosis not present

## 2023-04-04 DIAGNOSIS — I119 Hypertensive heart disease without heart failure: Secondary | ICD-10-CM | POA: Diagnosis present

## 2023-04-04 DIAGNOSIS — E871 Hypo-osmolality and hyponatremia: Secondary | ICD-10-CM | POA: Diagnosis present

## 2023-04-04 DIAGNOSIS — Z7982 Long term (current) use of aspirin: Secondary | ICD-10-CM

## 2023-04-04 DIAGNOSIS — R1310 Dysphagia, unspecified: Secondary | ICD-10-CM | POA: Diagnosis present

## 2023-04-04 DIAGNOSIS — Z79899 Other long term (current) drug therapy: Secondary | ICD-10-CM | POA: Diagnosis not present

## 2023-04-04 DIAGNOSIS — G8194 Hemiplegia, unspecified affecting left nondominant side: Secondary | ICD-10-CM | POA: Diagnosis present

## 2023-04-04 DIAGNOSIS — Z66 Do not resuscitate: Secondary | ICD-10-CM | POA: Diagnosis present

## 2023-04-04 DIAGNOSIS — I611 Nontraumatic intracerebral hemorrhage in hemisphere, cortical: Principal | ICD-10-CM | POA: Diagnosis present

## 2023-04-04 DIAGNOSIS — R0603 Acute respiratory distress: Secondary | ICD-10-CM | POA: Diagnosis present

## 2023-04-04 DIAGNOSIS — R7401 Elevation of levels of liver transaminase levels: Secondary | ICD-10-CM | POA: Diagnosis present

## 2023-04-04 DIAGNOSIS — E875 Hyperkalemia: Secondary | ICD-10-CM | POA: Diagnosis present

## 2023-04-04 DIAGNOSIS — Z794 Long term (current) use of insulin: Secondary | ICD-10-CM | POA: Diagnosis not present

## 2023-04-04 DIAGNOSIS — Z7952 Long term (current) use of systemic steroids: Secondary | ICD-10-CM

## 2023-04-04 DIAGNOSIS — R4701 Aphasia: Secondary | ICD-10-CM | POA: Diagnosis present

## 2023-04-04 DIAGNOSIS — G9341 Metabolic encephalopathy: Secondary | ICD-10-CM | POA: Diagnosis not present

## 2023-04-04 DIAGNOSIS — I10 Essential (primary) hypertension: Secondary | ICD-10-CM | POA: Diagnosis not present

## 2023-04-04 DIAGNOSIS — Z87891 Personal history of nicotine dependence: Secondary | ICD-10-CM | POA: Diagnosis not present

## 2023-04-04 DIAGNOSIS — Z515 Encounter for palliative care: Secondary | ICD-10-CM

## 2023-04-04 DIAGNOSIS — R29810 Facial weakness: Secondary | ICD-10-CM | POA: Diagnosis present

## 2023-04-04 DIAGNOSIS — F329 Major depressive disorder, single episode, unspecified: Secondary | ICD-10-CM | POA: Diagnosis present

## 2023-04-04 DIAGNOSIS — Z7401 Bed confinement status: Secondary | ICD-10-CM

## 2023-04-04 DIAGNOSIS — I69322 Dysarthria following cerebral infarction: Secondary | ICD-10-CM | POA: Diagnosis not present

## 2023-04-04 DIAGNOSIS — I69391 Dysphagia following cerebral infarction: Secondary | ICD-10-CM | POA: Diagnosis not present

## 2023-04-04 LAB — COMPREHENSIVE METABOLIC PANEL
ALT: 37 U/L (ref 0–44)
AST: 68 U/L — ABNORMAL HIGH (ref 15–41)
Albumin: 3.3 g/dL — ABNORMAL LOW (ref 3.5–5.0)
Alkaline Phosphatase: 181 U/L — ABNORMAL HIGH (ref 38–126)
Anion gap: 8 (ref 5–15)
BUN: 16 mg/dL (ref 8–23)
CO2: 28 mmol/L (ref 22–32)
Calcium: 9 mg/dL (ref 8.9–10.3)
Chloride: 92 mmol/L — ABNORMAL LOW (ref 98–111)
Creatinine, Ser: 0.6 mg/dL (ref 0.44–1.00)
GFR, Estimated: >60 mL/min (ref >60)
Glucose, Bld: 181 mg/dL — ABNORMAL HIGH (ref 70–99)
Potassium: 4 mmol/L (ref 3.5–5.1)
Sodium: 128 mmol/L — ABNORMAL LOW (ref 135–145)
Total Bilirubin: 1 mg/dL (ref <1.2)
Total Protein: 9.2 g/dL — ABNORMAL HIGH (ref 6.5–8.1)

## 2023-04-04 LAB — CBC WITH DIFFERENTIAL/PLATELET
Abs Immature Granulocytes: 0.03 10*3/uL (ref 0.00–0.07)
Basophils Absolute: 0 10*3/uL (ref 0.0–0.1)
Basophils Relative: 0 %
Eosinophils Absolute: 0.1 10*3/uL (ref 0.0–0.5)
Eosinophils Relative: 1 %
HCT: 48.5 % — ABNORMAL HIGH (ref 36.0–46.0)
Hemoglobin: 16.3 g/dL — ABNORMAL HIGH (ref 12.0–15.0)
Immature Granulocytes: 0 %
Lymphocytes Relative: 20 %
Lymphs Abs: 2 10*3/uL (ref 0.7–4.0)
MCH: 30.8 pg (ref 26.0–34.0)
MCHC: 33.6 g/dL (ref 30.0–36.0)
MCV: 91.7 fL (ref 80.0–100.0)
Monocytes Absolute: 0.7 10*3/uL (ref 0.1–1.0)
Monocytes Relative: 7 %
Neutro Abs: 7.4 10*3/uL (ref 1.7–7.7)
Neutrophils Relative %: 72 %
Platelets: 204 10*3/uL (ref 150–400)
RBC: 5.29 MIL/uL — ABNORMAL HIGH (ref 3.87–5.11)
RDW: 13.3 % (ref 11.5–15.5)
WBC: 10.3 10*3/uL (ref 4.0–10.5)
nRBC: 0 % (ref 0.0–0.2)

## 2023-04-04 LAB — CBG MONITORING, ED: Glucose-Capillary: 184 mg/dL — ABNORMAL HIGH (ref 70–99)

## 2023-04-04 LAB — ETHANOL: Alcohol, Ethyl (B): <10 mg/dL (ref <10)

## 2023-04-04 LAB — APTT: aPTT: 28 s (ref 24–36)

## 2023-04-04 LAB — GLUCOSE, CAPILLARY
Glucose-Capillary: 152 mg/dL — ABNORMAL HIGH (ref 70–99)
Glucose-Capillary: 89 mg/dL (ref 70–99)

## 2023-04-04 LAB — LIPID PANEL
Cholesterol: 123 mg/dL (ref 0–200)
HDL: 43 mg/dL (ref >40)
LDL Cholesterol: 63 mg/dL (ref 0–99)
Total CHOL/HDL Ratio: 2.9 {ratio}
Triglycerides: 86 mg/dL (ref <150)
VLDL: 17 mg/dL (ref 0–40)

## 2023-04-04 LAB — HEMOGLOBIN A1C
Hgb A1c MFr Bld: 5.5 % (ref 4.8–5.6)
Mean Plasma Glucose: 111.15 mg/dL

## 2023-04-04 LAB — MRSA NEXT GEN BY PCR, NASAL: MRSA by PCR Next Gen: NOT DETECTED

## 2023-04-04 LAB — MAGNESIUM: Magnesium: 1.8 mg/dL (ref 1.7–2.4)

## 2023-04-04 LAB — PROTIME-INR
INR: 1 (ref 0.8–1.2)
Prothrombin Time: 13.8 s (ref 11.4–15.2)

## 2023-04-04 LAB — PHOSPHORUS: Phosphorus: 3.9 mg/dL (ref 2.5–4.6)

## 2023-04-04 MED ORDER — NOREPINEPHRINE 4 MG/250ML-% IV SOLN: 2.0000 ug/min | INTRAVENOUS | Status: DC

## 2023-04-04 MED ORDER — ORAL CARE MOUTH RINSE: 15.0000 mL | OROMUCOSAL | Status: DC | PRN

## 2023-04-04 MED ORDER — LORAZEPAM 2 MG/ML IJ SOLN
1.0000 mg | Freq: Once | INTRAMUSCULAR | Status: AC
  Administered 2023-04-04: 1 mg via INTRAVENOUS
  Filled 2023-04-04: qty 1

## 2023-04-04 MED ORDER — INSULIN ASPART 100 UNIT/ML IJ SOLN
0.0000 [IU] | INTRAMUSCULAR | Status: DC
  Administered 2023-04-04: 2 [IU] via SUBCUTANEOUS
  Administered 2023-04-05 – 2023-04-06 (×4): 1 [IU] via SUBCUTANEOUS
  Filled 2023-04-04 (×3): qty 1

## 2023-04-04 MED ORDER — NICARDIPINE HCL IN NACL 20-0.86 MG/200ML-% IV SOLN: 0.0000 mg/h | INTRAVENOUS | Status: DC

## 2023-04-04 MED ORDER — LEVETIRACETAM IN NACL 500 MG/100ML IV SOLN
500.0000 mg | Freq: Two times a day (BID) | INTRAVENOUS | Status: DC
  Administered 2023-04-04 – 2023-04-05 (×2): 500 mg via INTRAVENOUS
  Filled 2023-04-04 (×2): qty 100

## 2023-04-04 MED ORDER — LEVETIRACETAM IN NACL 1000 MG/100ML IV SOLN
1000.0000 mg | Freq: Once | INTRAVENOUS | Status: AC
  Administered 2023-04-04: 1000 mg via INTRAVENOUS
  Filled 2023-04-04: qty 100

## 2023-04-04 MED ORDER — ACETAMINOPHEN 160 MG/5ML PO SOLN: 650.0000 mg | ORAL | Status: DC | PRN

## 2023-04-04 MED ORDER — ACETAMINOPHEN 650 MG RE SUPP: 650.0000 mg | RECTAL | Status: DC | PRN

## 2023-04-04 MED ORDER — LABETALOL HCL 5 MG/ML IV SOLN
10.0000 mg | INTRAVENOUS | Status: DC | PRN
  Filled 2023-04-04: qty 4

## 2023-04-04 MED ORDER — SENNOSIDES-DOCUSATE SODIUM 8.6-50 MG PO TABS
1.0000 | ORAL_TABLET | Freq: Two times a day (BID) | ORAL | Status: DC
  Administered 2023-04-05: 1 via ORAL
  Filled 2023-04-04: qty 1

## 2023-04-04 MED ORDER — SODIUM CHLORIDE 0.9 % IV BOLUS
500.0000 mL | Freq: Once | INTRAVENOUS | Status: AC
  Administered 2023-04-04: 500 mL via INTRAVENOUS

## 2023-04-04 MED ORDER — CHLORHEXIDINE GLUCONATE CLOTH 2 % EX PADS
6.0000 | MEDICATED_PAD | Freq: Every day | CUTANEOUS | Status: DC
  Administered 2023-04-04 – 2023-04-06 (×3): 6 via TOPICAL

## 2023-04-04 MED ORDER — ACETAMINOPHEN 325 MG PO TABS
650.0000 mg | ORAL_TABLET | ORAL | Status: DC | PRN
  Administered 2023-04-06: 650 mg via ORAL
  Filled 2023-04-04: qty 2

## 2023-04-04 MED ORDER — ORAL CARE MOUTH RINSE
15.0000 mL | OROMUCOSAL | Status: DC
  Administered 2023-04-04 – 2023-04-06 (×7): 15 mL via OROMUCOSAL

## 2023-04-04 MED ORDER — MAGNESIUM SULFATE 2 GM/50ML IV SOLN
2.0000 g | Freq: Once | INTRAVENOUS | Status: AC
  Administered 2023-04-04: 2 g via INTRAVENOUS
  Filled 2023-04-04: qty 50

## 2023-04-04 MED ORDER — PANTOPRAZOLE SODIUM 40 MG IV SOLR
40.0000 mg | Freq: Every day | INTRAVENOUS | Status: DC
  Administered 2023-04-04 – 2023-04-05 (×2): 40 mg via INTRAVENOUS
  Filled 2023-04-04 (×2): qty 10

## 2023-04-04 MED ORDER — STROKE: EARLY STAGES OF RECOVERY BOOK
Freq: Once | Status: AC
Start: 2023-04-05 — End: 2023-04-05

## 2023-04-04 MED ORDER — SODIUM CHLORIDE 0.9 % IV SOLN
250.0000 mL | INTRAVENOUS | Status: AC
  Administered 2023-04-04 – 2023-04-05 (×2): 250 mL via INTRAVENOUS

## 2023-04-04 NOTE — IPAL (Signed)
  Interdisciplinary Goals of Care Family Meeting   Date carried out: 04/04/2023  Location of the meeting: Bedside  Member's involved: Physician, Nurse Practitioner, Bedside Registered Nurse, and Family Member or next of kin    GOALS OF CARE DISCUSSION  The Clinical status was relayed to family in detail- Sister and Aunt  Updated and notified of patients medical condition- Patient remains unresponsive and will not open eyes to command.   Patient with increased WOB and using accessory muscles to breathe Explained to family course of therapy and the modalities   Patient with Progressive multiorgan failure with a very high probablity of a very minimal chance of meaningful recovery despite all aggressive and optimal medical therapy.   PATIENT REMAINS DNR/DNI status  Family understands the situation.  Severe Brain Damage, bleeding into the brain Patient may die tonight  Family are satisfied with Plan of action and management. All questions answered  Additional CC time 35 mins   Deklin Bieler Santiago Glad, M.D.  [facility]  Medical Director [facility] [place] Day Medical Director [facility]

## 2023-04-04 NOTE — Procedures (Addendum)
Patient Name: Kaitlyn Ruiz  MRN: [id number]  Epilepsy Attending: Charlsie Quest  Referring Physician/Provider: Milon Dikes, MD  Date: 04/04/2023 Duration: 26.31 mins  Patient history:  67 y.o. female with above past medical history presented with left-sided focal seizures and unresponsiveness and noted to have a right frontal intraparenchymal hemorrhage. EEG to evaluate for seizure  Level of alertness: comatose  AEDs during EEG study: LEV, ativan  Technical aspects: This EEG study was done with scalp electrodes positioned according to the 10-20 International system of electrode placement. Electrical activity was reviewed with band pass filter of 1-70Hz , sensitivity of 7 uV/mm, display speed of 34mm/sec with a 60Hz  notched filter applied as appropriate. EEG data were recorded continuously and digitally stored.  Video monitoring was available and reviewed as appropriate.  Description: EEG showed continuous 3 to 6 Hz theta-delta slowing in left hemisphere as well as low amplitude 2-3Hz  delta slowing in right hemisphere. Hyperventilation and photic stimulation were not performed.     ABNORMALITY - Continuous slow, generalized and lateralized right hemisphere   IMPRESSION: This study is suggestive of cortical dysfunction arising from right hemisphere likely secondary to underlying ICH. Additionally there is severe diffuse encephalopathy. No seizures or epileptiform discharges were seen throughout the recording.  Rachard Isidro Annabelle Harman

## 2023-04-04 NOTE — ED Notes (Signed)
Pt transported to ICU at this time by EMT-P

## 2023-04-04 NOTE — ED Provider Notes (Signed)
[facility] Provider Note    Event Date/Time   First MD Initiated Contact with Patient 04/04/23 1417     (approximate)   History   Seizures   HPI  Kaitlyn Ruiz is a 67 y.o. female   Past medical history of prior stroke, hypertension, depression, type II diabetic, here from nursing facility for first-time seizure.  Had left-sided whole body shaking witnessed by facility staff around 12:30 PM.  Reportedly otherwise has been in her regular state of health.  She is bedbound at baseline.  EMS reports that facility gave 2 mg of Ativan with no resolution of seizures, ongoing seizures when medics arrived and gave another IV Versed after which seizure activity stopped.  Total seizure activity time proximately 20 to 30 minutes.  Independent Historian contributed to assessment above: I spoke with the sister and POA [name] Bruna Potter -apparently the patient's baseline is that she is minimally conversant, bedbound, but has no focal deficits from prior stroke.  I also reviewed her DNR status for which the patient came with paperwork.  I also confirmed with her POA that this patient is DNI as well.      Physical Exam   Triage Vital Signs: ED Triage Vitals  Encounter Vitals Group     BP 04/04/23 1358 (!) 147/91     Systolic BP Percentile --      Diastolic BP Percentile --      Pulse Rate 04/04/23 1358 (!) 141     Resp 04/04/23 1400 19     Temp --      Temp src --      SpO2 04/04/23 1358 92 %     Weight --      Height --      Head Circumference --      Peak Flow --      Pain Score --      Pain Loc --      Pain Education --      Exclude from Growth Chart --     Most recent vital signs: Vitals:   04/04/23 1510 04/04/23 1520  BP: 111/73 106/74  Pulse: (!) 114 (!) 109  Resp: (!) 28 (!) 29  Temp:    SpO2: 99% 99%    General: Awake, no distress.  CV:  Good peripheral perfusion.  Resp:  Normal effort.  Abd:  No distention.  Other:  Leftward gaze  deviation.  Contracted and spastic left upper extremity.  Not responding to commands.  Left-sided facial droop.  Winces to painful stimulus   ED Results / Procedures / Treatments   Labs (all labs ordered are listed, but only abnormal results are displayed) Labs Reviewed  COMPREHENSIVE METABOLIC PANEL - Abnormal; Notable for the following components:      Result Value   Sodium 128 (*)    Chloride 92 (*)    Glucose, Bld 181 (*)    Total Protein 9.2 (*)    Albumin 3.3 (*)    AST 68 (*)    Alkaline Phosphatase 181 (*)    All other components within normal limits  CBC WITH DIFFERENTIAL/PLATELET - Abnormal; Notable for the following components:   RBC 5.29 (*)    Hemoglobin 16.3 (*)    HCT 48.5 (*)    All other components within normal limits  CBG MONITORING, ED - Abnormal; Notable for the following components:   Glucose-Capillary 184 (*)    All other components within normal limits  MRSA NEXT GEN  BY PCR, NASAL  ETHANOL  PROTIME-INR  APTT  CBC  DIFFERENTIAL  URINE DRUG SCREEN, QUALITATIVE ([facility] ONLY)  URINALYSIS, ROUTINE W REFLEX MICROSCOPIC  CBC  LIPID PANEL  HEMOGLOBIN A1C     I ordered and reviewed the above labs they are notable for blood glucose is 180  RADIOLOGY I independently reviewed and interpreted CT of the head and see a right-sided head bleed intraparenchymal I also reviewed radiologist's formal read.   PROCEDURES:  Critical Care performed: Yes, see critical care procedure note(s)  .Critical Care  Performed by: Pilar Jarvis, MD Authorized by: Pilar Jarvis, MD   Critical care provider statement:    Critical care time (minutes):  30   Critical care was time spent personally by me on the following activities:  Development of treatment plan with patient or surrogate, discussions with consultants, evaluation of patient's response to treatment, examination of patient, ordering and review of laboratory studies, ordering and review of radiographic studies, ordering  and performing treatments and interventions, pulse oximetry, re-evaluation of patient's condition and review of old charts    MEDICATIONS ORDERED IN ED: Medications  labetalol (NORMODYNE) injection 10 mg (has no administration in time range)   stroke: early stages of recovery book (has no administration in time range)  acetaminophen (TYLENOL) tablet 650 mg (has no administration in time range)    Or  acetaminophen (TYLENOL) 160 MG/5ML solution 650 mg (has no administration in time range)    Or  acetaminophen (TYLENOL) suppository 650 mg (has no administration in time range)  senna-docusate (Senokot-S) tablet 1 tablet (has no administration in time range)  pantoprazole (PROTONIX) injection 40 mg (has no administration in time range)  nicardipine (CARDENE) 20mg  in 0.86% saline IV infusion (0.1 mg/ml) (has no administration in time range)  levETIRAcetam (KEPPRA) IVPB 500 mg/100 mL premix (has no administration in time range)  levETIRAcetam (KEPPRA) IVPB 1000 mg/100 mL premix (0 mg Intravenous Stopped 04/04/23 1448)  LORazepam (ATIVAN) injection 1 mg (1 mg Intravenous Given 04/04/23 1424)  levETIRAcetam (KEPPRA) IVPB 1000 mg/100 mL premix (0 mg Intravenous Stopped 04/04/23 1448)    External physician / consultants:  I spoke with neurology consultant Dr. Wilford Corner Regarding care plan for this patient.   IMPRESSION / MDM / ASSESSMENT AND PLAN / ED COURSE  I reviewed the triage vital signs and the nursing notes.                                [name] is most consistent with acute presentation with potential threat to life or bodily function.  Differential diagnosis includes, but is not limited to, seizure, electrolyte disturbance, ICH, stroke   The patient is on the cardiac monitor to evaluate for evidence of arrhythmia and/or significant heart rate changes.  MDM:    Stroke code for this patient with first-time seizure with focal deficits.  Found to have  intraparenchymal right-sided bleed.  Blood pressure control, Keppra, admit to ICU.  DNR/DNI confirmed with sister over the phone, informed of findings.        FINAL CLINICAL IMPRESSION(S) / ED DIAGNOSES   Final diagnoses:  Right-sided nontraumatic intracerebral hemorrhage, unspecified cerebral location (HCC)  Seizure (HCC)     Rx / DC Orders   ED Discharge Orders          Ordered    Do not attempt resuscitation (DNR)  Status:  Canceled       Comments: Sister  and POA ([name] Benbow)   04/04/23 1419             Note:  This document was prepared using Dragon voice recognition software and may include unintentional dictation errors.    Pilar Jarvis, MD 04/04/23 [phone]

## 2023-04-04 NOTE — Plan of Care (Signed)
Patient minimally responsive, mute, and unable to follow commands or participate in care at this time.   Problem: Education: Goal: Knowledge of disease or condition will improve Outcome: Not Progressing Goal: Knowledge of secondary prevention will improve (MUST DOCUMENT ALL) Outcome: Not Progressing Goal: Knowledge of patient specific risk factors will improve Loraine Leriche N/A or DELETE if not current risk factor) Outcome: Not Progressing   Problem: Intracerebral Hemorrhage Tissue Perfusion: Goal: Complications of Intracerebral Hemorrhage will be minimized Outcome: Not Progressing   Problem: Coping: Goal: Will verbalize positive feelings about self Outcome: Not Progressing Goal: Will identify appropriate support needs Outcome: Not Progressing   Problem: Health Behavior/Discharge Planning: Goal: Ability to manage health-related needs will improve Outcome: Not Progressing Goal: Goals will be collaboratively established with patient/family Outcome: Not Progressing   Problem: Self-Care: Goal: Ability to participate in self-care as condition permits will improve Outcome: Not Progressing Goal: Verbalization of feelings and concerns over difficulty with self-care will improve Outcome: Not Progressing Goal: Ability to communicate needs accurately will improve Outcome: Not Progressing   Problem: Nutrition: Goal: Risk of aspiration will decrease Outcome: Not Progressing Goal: Dietary intake will improve Outcome: Not Progressing   Problem: Activity: Goal: Risk for activity intolerance will decrease Outcome: Not Progressing   Problem: Nutrition: Goal: Adequate nutrition will be maintained Outcome: Not Progressing
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
## 2023-04-04 NOTE — ED Notes (Signed)
EEG tech at bedside. 

## 2023-04-04 NOTE — ED Notes (Signed)
Called [facility] to initiate Code Stroke spoke to [place] @ 1402 to give demographics and symptoms. Patient to CT now
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
## 2023-04-04 NOTE — ED Triage Notes (Signed)
Pt arrived via [facility] Per EMS they witnessed 30 seconds left sided seizure-like activity with 10 second pauses in between that lasted 5 minutes. No hx of seizures. Pt unresponsive on arrival. Code stroke called per Modesto Charon, MD. Pt resides at [facility]. 1230 - LKW 1250 - Seizure like activity started per facility.  1310 - 2mg  Ativan IM 1320 - 2.5mg  Versed IV BG 177 20g Rt hand
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
## 2023-04-04 NOTE — Plan of Care (Signed)
  Problem: Education: Goal: Knowledge of disease or condition will improve Outcome: Progressing Goal: Knowledge of secondary prevention will improve (MUST DOCUMENT ALL) Outcome: Progressing Goal: Knowledge of patient specific risk factors will improve Loraine Leriche N/A or DELETE if not current risk factor) Outcome: Progressing   Problem: Intracerebral Hemorrhage Tissue Perfusion: Goal: Complications of Intracerebral Hemorrhage will be minimized Outcome: Progressing   Problem: Coping: Goal: Will verbalize positive feelings about self Outcome: Progressing Goal: Will identify appropriate support needs Outcome: Progressing   Problem: Health Behavior/Discharge Planning: Goal: Ability to manage health-related needs will improve Outcome: Progressing Goal: Goals will be collaboratively established with patient/family Outcome: Progressing   Problem: Self-Care: Goal: Ability to participate in self-care as condition permits will improve Outcome: Progressing Goal: Verbalization of feelings and concerns over difficulty with self-care will improve Outcome: Progressing Goal: Ability to communicate needs accurately will improve Outcome: Progressing   Problem: Nutrition: Goal: Risk of aspiration will decrease Outcome: Progressing Goal: Dietary intake will improve Outcome: Progressing   Problem: Education: Goal: Knowledge of General Education information will improve Description: Including pain rating scale, medication(s)/side effects and non-pharmacologic comfort measures Outcome: Progressing   Problem: Health Behavior/Discharge Planning: Goal: Ability to manage health-related needs will improve Outcome: Progressing   Problem: Clinical Measurements: Goal: Ability to maintain clinical measurements within normal limits will improve Outcome: Progressing Goal: Will remain free from infection Outcome: Progressing Goal: Diagnostic test results will improve Outcome: Progressing Goal:  Respiratory complications will improve Outcome: Progressing Goal: Cardiovascular complication will be avoided Outcome: Progressing   Problem: Activity: Goal: Risk for activity intolerance will decrease Outcome: Progressing   Problem: Nutrition: Goal: Adequate nutrition will be maintained Outcome: Progressing   Problem: Coping: Goal: Level of anxiety will decrease Outcome: Progressing   Problem: Elimination: Goal: Will not experience complications related to bowel motility Outcome: Progressing Goal: Will not experience complications related to urinary retention Outcome: Progressing   Problem: Pain Management: Goal: General experience of comfort will improve Outcome: Progressing   Problem: Safety: Goal: Ability to remain free from injury will improve Outcome: Progressing   Problem: Skin Integrity: Goal: Risk for impaired skin integrity will decrease Outcome: Progressing   Problem: Education: Goal: Ability to describe self-care measures that may prevent or decrease complications (Diabetes Survival Skills Education) will improve Outcome: Progressing Goal: Individualized Educational Video(s) Outcome: Progressing   Problem: Coping: Goal: Ability to adjust to condition or change in health will improve Outcome: Progressing   Problem: Fluid Volume: Goal: Ability to maintain a balanced intake and output will improve Outcome: Progressing   Problem: Health Behavior/Discharge Planning: Goal: Ability to identify and utilize available resources and services will improve Outcome: Progressing Goal: Ability to manage health-related needs will improve Outcome: Progressing   Problem: Metabolic: Goal: Ability to maintain appropriate glucose levels will improve Outcome: Progressing   Problem: Nutritional: Goal: Maintenance of adequate nutrition will improve Outcome: Progressing Goal: Progress toward achieving an optimal weight will improve Outcome: Progressing   Problem:  Skin Integrity: Goal: Risk for impaired skin integrity will decrease Outcome: Progressing   Problem: Tissue Perfusion: Goal: Adequacy of tissue perfusion will improve Outcome: Progressing

## 2023-04-04 NOTE — Progress Notes (Addendum)
Code Stroke activated @ 1402.  To CT @ 1406.  Dr. Wilford Corner paged @ [phone].  Dr. Wilford Corner visualized in CT @ 1407.  TSRN dismissed from cart @ 1409.  Josiah Lobo BSN, Occupational hygienist

## 2023-04-04 NOTE — Progress Notes (Signed)
EEG with left sided cortical dysfunction. No seizures.  Continue to monitor in the ICU. If there is clinical concern for ongoing seizures or breakthrough seizure, consider hooking her up to rapid EEG ([facility]). Please call on call neurologist for guidance as needed.  -- Milon Dikes, MD Neurologist

## 2023-04-04 NOTE — Progress Notes (Signed)
Eeg done 

## 2023-04-05 ENCOUNTER — Encounter: Payer: Self-pay | Admitting: Internal Medicine

## 2023-04-05 ENCOUNTER — Inpatient Hospital Stay: Payer: Medicare Other

## 2023-04-05 ENCOUNTER — Ambulatory Visit: Payer: Medicare Other

## 2023-04-05 ENCOUNTER — Inpatient Hospital Stay (HOSPITAL_COMMUNITY)
Admit: 2023-04-05 | Discharge: 2023-04-05 | Disposition: A | Discharge status: Home / Self Care | Payer: Medicare Other | Attending: Student in an Organized Health Care Education/Training Program

## 2023-04-05 DIAGNOSIS — I10 Essential (primary) hypertension: Secondary | ICD-10-CM | POA: Diagnosis not present

## 2023-04-05 DIAGNOSIS — R569 Unspecified convulsions: Secondary | ICD-10-CM | POA: Diagnosis not present

## 2023-04-05 DIAGNOSIS — I619 Nontraumatic intracerebral hemorrhage, unspecified: Secondary | ICD-10-CM

## 2023-04-05 LAB — ECHOCARDIOGRAM COMPLETE
AR max vel: 1.97 cm2
AV Area VTI: 2.52 cm2
AV Area mean vel: 2.37 cm2
AV Mean grad: 8 mm[Hg]
AV Peak grad: 19.4 mm[Hg]
Ao pk vel: 2.2 m/s
Area-P 1/2: 5.16 cm2
Height: 65 in
MV VTI: 3.36 cm2
S' Lateral: 2.2 cm
Weight: 2772.5 [oz_av]

## 2023-04-05 LAB — COMPREHENSIVE METABOLIC PANEL
ALT: 33 U/L (ref 0–44)
AST: 65 U/L — ABNORMAL HIGH (ref 15–41)
Albumin: 2.8 g/dL — ABNORMAL LOW (ref 3.5–5.0)
Alkaline Phosphatase: 123 U/L (ref 38–126)
Anion gap: 7 (ref 5–15)
BUN: 15 mg/dL (ref 8–23)
CO2: 27 mmol/L (ref 22–32)
Calcium: 8.8 mg/dL — ABNORMAL LOW (ref 8.9–10.3)
Chloride: 96 mmol/L — ABNORMAL LOW (ref 98–111)
Creatinine, Ser: 0.63 mg/dL (ref 0.44–1.00)
GFR, Estimated: >60 mL/min (ref >60)
Glucose, Bld: 73 mg/dL (ref 70–99)
Potassium: 5.3 mmol/L — ABNORMAL HIGH (ref 3.5–5.1)
Sodium: 130 mmol/L — ABNORMAL LOW (ref 135–145)
Total Bilirubin: 0.9 mg/dL (ref <1.2)
Total Protein: 7.8 g/dL (ref 6.5–8.1)

## 2023-04-05 LAB — CBC WITH DIFFERENTIAL/PLATELET
Abs Immature Granulocytes: 0.05 10*3/uL (ref 0.00–0.07)
Basophils Absolute: 0 10*3/uL (ref 0.0–0.1)
Basophils Relative: 0 %
Eosinophils Absolute: 0 10*3/uL (ref 0.0–0.5)
Eosinophils Relative: 0 %
HCT: 45.2 % (ref 36.0–46.0)
Hemoglobin: 14.9 g/dL (ref 12.0–15.0)
Immature Granulocytes: 1 %
Lymphocytes Relative: 28 %
Lymphs Abs: 3 10*3/uL (ref 0.7–4.0)
MCH: 30.6 pg (ref 26.0–34.0)
MCHC: 33 g/dL (ref 30.0–36.0)
MCV: 92.8 fL (ref 80.0–100.0)
Monocytes Absolute: 1.1 10*3/uL — ABNORMAL HIGH (ref 0.1–1.0)
Monocytes Relative: 10 %
Neutro Abs: 6.7 10*3/uL (ref 1.7–7.7)
Neutrophils Relative %: 61 %
Platelets: 196 10*3/uL (ref 150–400)
RBC: 4.87 MIL/uL (ref 3.87–5.11)
RDW: 13.4 % (ref 11.5–15.5)
WBC: 10.9 10*3/uL — ABNORMAL HIGH (ref 4.0–10.5)
nRBC: 0 % (ref 0.0–0.2)

## 2023-04-05 LAB — GLUCOSE, CAPILLARY
Glucose-Capillary: 104 mg/dL — ABNORMAL HIGH (ref 70–99)
Glucose-Capillary: 132 mg/dL — ABNORMAL HIGH (ref 70–99)
Glucose-Capillary: 145 mg/dL — ABNORMAL HIGH (ref 70–99)
Glucose-Capillary: 80 mg/dL (ref 70–99)
Glucose-Capillary: 81 mg/dL (ref 70–99)
Glucose-Capillary: 94 mg/dL (ref 70–99)
Glucose-Capillary: 95 mg/dL (ref 70–99)

## 2023-04-05 LAB — POTASSIUM: Potassium: 5 mmol/L (ref 3.5–5.1)

## 2023-04-05 LAB — MAGNESIUM: Magnesium: 2.4 mg/dL (ref 1.7–2.4)

## 2023-04-05 LAB — PHOSPHORUS: Phosphorus: 3.7 mg/dL (ref 2.5–4.6)

## 2023-04-05 MED ORDER — LEVETIRACETAM IN NACL 1000 MG/100ML IV SOLN
1000.0000 mg | Freq: Once | INTRAVENOUS | Status: AC
  Administered 2023-04-05: 1000 mg via INTRAVENOUS
  Filled 2023-04-05: qty 100

## 2023-04-05 MED ORDER — DEXTROSE 50 % IV SOLN
1.0000 | Freq: Once | INTRAVENOUS | Status: AC
  Administered 2023-04-05: 50 mL via INTRAVENOUS
  Filled 2023-04-05: qty 50

## 2023-04-05 MED ORDER — PROSOURCE TF20 ENFIT COMPATIBL EN LIQD
60.0000 mL | Freq: Every day | ENTERAL | Status: DC
  Administered 2023-04-06: 60 mL

## 2023-04-05 MED ORDER — JUVEN PO PACK
1.0000 | PACK | Freq: Two times a day (BID) | ORAL | Status: DC
  Administered 2023-04-06 (×2): 1

## 2023-04-05 MED ORDER — FREE WATER
50.0000 mL | Status: DC
  Administered 2023-04-05 – 2023-04-06 (×6): 50 mL

## 2023-04-05 MED ORDER — INSULIN ASPART 100 UNIT/ML IV SOLN
10.0000 [IU] | Freq: Once | INTRAVENOUS | Status: AC
  Administered 2023-04-05: 10 [IU] via INTRAVENOUS
  Filled 2023-04-05: qty 0.1

## 2023-04-05 MED ORDER — DIATRIZOATE MEGLUMINE & SODIUM 66-10 % PO SOLN
30.0000 mL | Freq: Once | ORAL | Status: AC
Start: 2023-04-05 — End: 2023-04-05
  Administered 2023-04-05: 30 mL

## 2023-04-05 MED ORDER — LEVETIRACETAM IN NACL 1000 MG/100ML IV SOLN
1000.0000 mg | Freq: Two times a day (BID) | INTRAVENOUS | Status: DC
  Administered 2023-04-05 – 2023-04-06 (×2): 1000 mg via INTRAVENOUS
  Filled 2023-04-05 (×3): qty 100

## 2023-04-05 MED ORDER — OSMOLITE 1.5 CAL PO LIQD
1000.0000 mL | ORAL | Status: DC
  Administered 2023-04-05: 1000 mL

## 2023-04-05 MED ORDER — ZINC OXIDE 40 % EX OINT
1.0000 | TOPICAL_OINTMENT | Freq: Three times a day (TID) | CUTANEOUS | Status: DC | PRN
  Administered 2023-04-05: 1 via TOPICAL
  Filled 2023-04-05: qty 113

## 2023-04-05 MED ORDER — HYDROCERIN EX CREA
TOPICAL_CREAM | Freq: Two times a day (BID) | CUTANEOUS | Status: DC
  Administered 2023-04-06: 1 via TOPICAL
  Filled 2023-04-05: qty 113

## 2023-04-05 MED ORDER — AMMONIUM LACTATE 12 % EX LOTN
1.0000 | TOPICAL_LOTION | Freq: Every day | CUTANEOUS | Status: DC
  Administered 2023-04-05 – 2023-04-06 (×2): 1 via TOPICAL
  Filled 2023-04-05: qty 400

## 2023-04-05 NOTE — Progress Notes (Signed)
OT Cancellation Note  Patient Details Name: Kaitlyn Ruiz MRN: [id number] DOB: Oct 01, 1955   Cancelled Treatment:    Reason Eval/Treat Not Completed: Other (comment) (pt OTF at this time, OT will re attempt as able and approrpiate) Oleta Mouse, OTD OTR/L  04/05/23, 2:49 PM

## 2023-04-05 NOTE — Procedures (Signed)
History: 67 yo F with R frontal IPH  Sedation: none  Patient State: Awake and drowsy  Technique: This EEG was acquired with electrodes placed according to the International 10-20 electrode system (including Fp1, Fp2, F3, F4, C3, C4, P3, P4, O1, O2, T3, T4, T5, T6, A1, A2, Fz, Cz, Pz). The following electrodes were missing or displaced: none.   Background: There is a posterior dominant rhythm of 8 to 9 Hz which is seen bilaterally.  In addition, there is generalized irregular delta and theta range activity intruding into the background.  There are frequent frontotemporal(Fp2, F4 > F8 > T8) sharp waves which at times do occur with periodicity never exceeding 0.5 Hz.  There is no overriding fast activity or rhythmicity, nor is there any evolution.   Photic stimulation: Physiologic driving is not performed  EEG Abnormalities: 1) lateralized periodic discharges in the right frontotemporal region with sharp wave morphology 2) generalized irregular slow activity  Clinical Interpretation: This EEG recorded an area of cortical irritability with the potential for seizure generation in the right frontotemporal region.  There was no definite seizure recorded.   Ritta Slot, MD [facility] [phone]  If 7pm- 7am, please page neurology on call as listed in AMION.
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
## 2023-04-05 NOTE — Progress Notes (Signed)
SLP Cancellation Note  Patient Details Name: Kaitlyn Ruiz MRN: [id number] DOB: 06/11/1955   Cancelled treatment:       Reason Eval/Treat Not Completed: Medical issues which prohibited therapy;Patient's level of consciousness;Patient not medically ready (chart reviewed)  Consulted NSG. Per chart, pt has been poorly responsive. NSG reported intermittent reactive responses during physical assessment this morning.  Attempted cognitive-communication evaluation w/ pt this morning; pt did Not respond to MAX verbal/tactile stim(sternal rub, calling name loudly) except to give eye flutter. Noted mouth breathing w/ slow breathing; no attempt to communicate.   Pt is not appropriate for a cognitive-communication assessment at this time. ST services will monitor pt's status over the next 2-3 days for improvement in neurological status; alertness to participate in assessment. NSG updated and agreed.       Jerilynn Som, MS, CCC-SLP Speech Language Pathologist Rehab Services; [facility] [phone] (ascom) Kennah Hehr 04/05/2023, 8:51 AM

## 2023-04-05 NOTE — Progress Notes (Signed)
Eeg done 

## 2023-04-05 NOTE — Progress Notes (Signed)
Updated pts aunt at bedside regarding pts condition and current plan of care.  All questions were answered.  Zada Girt, AGNP  Pulmonary/Critical Care Pager [phone] (please enter 7 digits) PCCM Consult Pager [phone] (please enter 7 digits)

## 2023-04-05 NOTE — Progress Notes (Signed)
Patient transported to MRI for brain MRI,  accompanied by RN and transporter without complications.
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
## 2023-04-05 NOTE — Progress Notes (Signed)
PT Cancellation Note  Patient Details Name: Kaitlyn Ruiz MRN: [id number] DOB: 25-Jan-1956   Cancelled Treatment:    Reason Eval/Treat Not Completed: Patient at procedure or test/unavailable (Patient has been off the floor for MRI with results pending. PT will hold and follow up as appropriate.)  Donna Bernard, PT, MPT   Ina Homes 04/05/2023, 2:59 PM

## 2023-04-05 NOTE — Progress Notes (Signed)
*  PRELIMINARY RESULTS* Echocardiogram 2D Echocardiogram has been performed.  Kaitlyn Ruiz 04/05/2023, 12:52 PM

## 2023-04-05 NOTE — Progress Notes (Signed)
Initial Nutrition Assessment  DOCUMENTATION CODES:   Not applicable  INTERVENTION:   Osmolite 1.5@50ml /hr + ProSource TF 20- Give 60ml daily via tube  Free water flushes 50ml q4 hours to maintain tube patency   Regimen provides 1880kcal/day, 95g/day protein and 1243ml/day of free water.   Pt at refeed risk; recommend monitor potassium, magnesium and phosphorus labs daily until stable  Juven Fruit Punch BID via tube, each serving provides 95kcal and 2.5g of protein (amino acids glutamine and arginine)  Daily weights   NUTRITION DIAGNOSIS:   Inadequate oral intake related to dysphagia as evidenced by NPO status (pt NPO with chronic G-tube).  GOAL:   Patient will meet greater than or equal to 90% of their needs  MONITOR:   Diet advancement, Labs, Weight trends, TF tolerance, I & O's, Skin  REASON FOR ASSESSMENT:   Consult Enteral/tube feeding initiation and management  ASSESSMENT:   67 y/o female with h/o COPD, DM, esophageal dysphagia with chronic G-tube, CVA, HTN, MDD, dementia, bedbound lives in SNF and frequent aspiration PNA who is admitted with  concern for seizures and new ICH.  RD unable to see patient today as pt having radiology procedure at time of RD visit. Pt with AMS and is unable to provide any history. Spoke with RN at [facility] who reports pt's home tube feed regimen is Isosource 1.5@40ml /hr continuous plus Prostat 30ml three time daily along with q 4 hour water flushes (provides 1740kal/day, 110g/day protein and 1976ml/day of free water). Pt does eat some pureed and ground foods for pleasure. Per RN, pt is able to have conversation at baseline. SLP is following. Will plan to initiate tube feeds once G-tube is confirmed in good position. Per chart, pt appears weight stable at baseline. RD will obtain NFPE at follow up.   Medications reviewed and include: insulin, protonix, senokot  Labs reviewed: Na 130(L), K 5.3(H), P 3.7 wnl, Mg 2.4 wnl Wbc-  10.9(H) Cbgs- 80, 81, 95 x 24 hrs  AIC 5.5 - 11/4  NUTRITION - FOCUSED PHYSICAL EXAM: Unable to perform at this time   Diet Order:   Diet Order             Diet NPO time specified  Diet effective now                  EDUCATION NEEDS:   No education needs have been identified at this time  Skin:  Skin Assessment: Reviewed RN Assessment (MASD)  Last BM:  11/5- type 7  Height:   Ht Readings from Last 1 Encounters:  04/04/23 5\' 5"  (1.651 m)    Weight:   Wt Readings from Last 1 Encounters:  04/04/23 78.6 kg    Ideal Body Weight:  56.8 kg  BMI:  Body mass index is 28.84 kg/m.  Estimated Nutritional Needs:   Kcal:  1700-2000kcal/day  Protein:  85-100g/day  Fluid:  1.7-2.0L/day  Betsey Holiday MS, RD, LDN Please refer to [facility] for RD and/or RD on-call/weekend/after hours pager

## 2023-04-05 NOTE — Progress Notes (Signed)
NEUROLOGY CONSULT FOLLOW UP NOTE   Date of service: April 05, 2023 Patient Name: Kaitlyn Ruiz MRN:  [id number] DOB:  1956-04-14  Brief HPI  Kaitlyn Ruiz is a 67 y.o. female with a history of previous stroke, MRS 5,  who presented on 04/04/2023 with  concern for seizures and new small ICH. At Baseline, she is bed bound, can mumble some, but not have a coherent conversation.    Interval Hx/subjective   No further seizures  Vitals   Vitals:   04/05/23 0400 04/05/23 0500 04/05/23 0600 04/05/23 0700  BP: (!) 96/53 (!) 98/52 (!) 86/46 (!) 101/55  Pulse: 88 81 84 85  Resp: (!) 22 (!) 27 (!) 28 (!) 23  Temp: 98.1 F (36.7 C)     TempSrc: Oral     SpO2: 100% 100% 100% 97%  Weight:      Height:         Body mass index is 28.84 kg/m.  Physical Exam   Gen: in bed, NAD  Neurologic Examination    Neuro: MS: opens eyes minimally to noxious stimulation, does not follow commands [id number] are slightlty dysconjugate, but not deviated. Left facial weakness when grimacing.  Motor: withdraws more on the right than left.  Sensory:as above.   Labs and Diagnostic Imaging   CBC:  Recent Labs  Lab 04/04/23 1406 04/05/23 0357  WBC 10.3 10.9*  NEUTROABS 7.4 6.7  HGB 16.3* 14.9  HCT 48.5* 45.2  MCV 91.7 92.8  PLT 204 196    Basic Metabolic Panel:  Lab Results  Component Value Date   NA 130 (L) 04/05/2023   K 5.3 (H) 04/05/2023   CO2 27 04/05/2023   GLUCOSE 73 04/05/2023   BUN 15 04/05/2023   CREATININE 0.63 04/05/2023   CALCIUM 8.8 (L) 04/05/2023   GFRNONAA >60 04/05/2023   GFRAA >60 05/29/2019   Lipid Panel:  Lab Results  Component Value Date   LDLCALC 63 04/04/2023   HgbA1c:  Lab Results  Component Value Date   HGBA1C 5.5 04/04/2023   Urine Drug Screen: No results found for: "LABOPIA", "COCAINSCRNUR", "LABBENZ", "AMPHETMU", "THCU", "LABBARB"   Imaging(Personally reviewed):    Impression   Kaitlyn Ruiz is a 67 y.o. female with small IPH in the high  right frontal region who presented with seizure activity. This appears to have since resolved, but she remains densely encephalopathic with  new ICH. I suspect this may be hemorrhagic conversion of an ischemci stroke and therefor eMRI would be helpful for prognosis.   Recommendations  Repeat EEG today.  MRI brain Continue keppra 500gm BID Neurology will follow.  ______________________________________________________________________   Thank you for the opportunity to take part in the care of this patient. If you have any further questions, please contact the neurology consultation team on call. Updated oncall schedule is listed on AMION.  Signed,  Ritta Slot Neurohospitalist
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
## 2023-04-05 NOTE — Plan of Care (Signed)
  Problem: Education: Goal: Knowledge of disease or condition will improve Outcome: Progressing Goal: Knowledge of secondary prevention will improve (MUST DOCUMENT ALL) Outcome: Progressing Goal: Knowledge of patient specific risk factors will improve Loraine Leriche N/A or DELETE if not current risk factor) Outcome: Progressing   Problem: Intracerebral Hemorrhage Tissue Perfusion: Goal: Complications of Intracerebral Hemorrhage will be minimized Outcome: Progressing   Problem: Coping: Goal: Will verbalize positive feelings about self Outcome: Progressing Goal: Will identify appropriate support needs Outcome: Progressing   Problem: Health Behavior/Discharge Planning: Goal: Ability to manage health-related needs will improve Outcome: Progressing Goal: Goals will be collaboratively established with patient/family Outcome: Progressing   Problem: Self-Care: Goal: Ability to participate in self-care as condition permits will improve Outcome: Progressing Goal: Verbalization of feelings and concerns over difficulty with self-care will improve Outcome: Progressing Goal: Ability to communicate needs accurately will improve Outcome: Progressing   Problem: Nutrition: Goal: Risk of aspiration will decrease Outcome: Progressing Goal: Dietary intake will improve Outcome: Progressing   Problem: Education: Goal: Knowledge of General Education information will improve Description: Including pain rating scale, medication(s)/side effects and non-pharmacologic comfort measures Outcome: Progressing   Problem: Health Behavior/Discharge Planning: Goal: Ability to manage health-related needs will improve Outcome: Progressing   Problem: Clinical Measurements: Goal: Ability to maintain clinical measurements within normal limits will improve Outcome: Progressing Goal: Will remain free from infection Outcome: Progressing Goal: Diagnostic test results will improve Outcome: Progressing Goal:  Respiratory complications will improve Outcome: Progressing Goal: Cardiovascular complication will be avoided Outcome: Progressing   Problem: Activity: Goal: Risk for activity intolerance will decrease Outcome: Progressing   Problem: Nutrition: Goal: Adequate nutrition will be maintained Outcome: Progressing   Problem: Coping: Goal: Level of anxiety will decrease Outcome: Progressing   Problem: Elimination: Goal: Will not experience complications related to bowel motility Outcome: Progressing Goal: Will not experience complications related to urinary retention Outcome: Progressing   Problem: Pain Management: Goal: General experience of comfort will improve Outcome: Progressing   Problem: Safety: Goal: Ability to remain free from injury will improve Outcome: Progressing   Problem: Skin Integrity: Goal: Risk for impaired skin integrity will decrease Outcome: Progressing   Problem: Education: Goal: Ability to describe self-care measures that may prevent or decrease complications (Diabetes Survival Skills Education) will improve Outcome: Progressing Goal: Individualized Educational Video(s) Outcome: Progressing   Problem: Coping: Goal: Ability to adjust to condition or change in health will improve Outcome: Progressing   Problem: Fluid Volume: Goal: Ability to maintain a balanced intake and output will improve Outcome: Progressing   Problem: Health Behavior/Discharge Planning: Goal: Ability to identify and utilize available resources and services will improve Outcome: Progressing Goal: Ability to manage health-related needs will improve Outcome: Progressing   Problem: Metabolic: Goal: Ability to maintain appropriate glucose levels will improve Outcome: Progressing   Problem: Nutritional: Goal: Maintenance of adequate nutrition will improve Outcome: Progressing Goal: Progress toward achieving an optimal weight will improve Outcome: Progressing   Problem:  Skin Integrity: Goal: Risk for impaired skin integrity will decrease Outcome: Progressing   Problem: Tissue Perfusion: Goal: Adequacy of tissue perfusion will improve Outcome: Progressing

## 2023-04-06 ENCOUNTER — Inpatient Hospital Stay: Payer: Medicare Other

## 2023-04-06 ENCOUNTER — Other Ambulatory Visit: Payer: Self-pay

## 2023-04-06 ENCOUNTER — Inpatient Hospital Stay (HOSPITAL_COMMUNITY)
Admission: AD | Admit: 2023-04-06 | Discharge: 2023-04-12 | DRG: 064 | Disposition: A | Discharge status: Skilled Nursing Facility | Payer: Medicare Other | Source: Skilled Nursing Facility | Attending: Internal Medicine | Admitting: Internal Medicine

## 2023-04-06 ENCOUNTER — Encounter (HOSPITAL_COMMUNITY): Payer: Self-pay | Admitting: Internal Medicine

## 2023-04-06 ENCOUNTER — Encounter (HOSPITAL_COMMUNITY): Payer: Self-pay

## 2023-04-06 DIAGNOSIS — N390 Urinary tract infection, site not specified: Secondary | ICD-10-CM | POA: Diagnosis present

## 2023-04-06 DIAGNOSIS — K8020 Calculus of gallbladder without cholecystitis without obstruction: Secondary | ICD-10-CM | POA: Diagnosis present

## 2023-04-06 DIAGNOSIS — G9341 Metabolic encephalopathy: Secondary | ICD-10-CM | POA: Diagnosis present

## 2023-04-06 DIAGNOSIS — I69391 Dysphagia following cerebral infarction: Secondary | ICD-10-CM

## 2023-04-06 DIAGNOSIS — Z66 Do not resuscitate: Secondary | ICD-10-CM | POA: Diagnosis present

## 2023-04-06 DIAGNOSIS — F0390 Unspecified dementia without behavioral disturbance: Secondary | ICD-10-CM | POA: Diagnosis present

## 2023-04-06 DIAGNOSIS — G9349 Other encephalopathy: Secondary | ICD-10-CM | POA: Diagnosis present

## 2023-04-06 DIAGNOSIS — K760 Fatty (change of) liver, not elsewhere classified: Secondary | ICD-10-CM | POA: Diagnosis present

## 2023-04-06 DIAGNOSIS — I69322 Dysarthria following cerebral infarction: Secondary | ICD-10-CM | POA: Diagnosis not present

## 2023-04-06 DIAGNOSIS — E871 Hypo-osmolality and hyponatremia: Secondary | ICD-10-CM | POA: Diagnosis present

## 2023-04-06 DIAGNOSIS — Z794 Long term (current) use of insulin: Secondary | ICD-10-CM | POA: Diagnosis not present

## 2023-04-06 DIAGNOSIS — Z87891 Personal history of nicotine dependence: Secondary | ICD-10-CM

## 2023-04-06 DIAGNOSIS — I611 Nontraumatic intracerebral hemorrhage in hemisphere, cortical: Secondary | ICD-10-CM

## 2023-04-06 DIAGNOSIS — I7389 Other specified peripheral vascular diseases: Secondary | ICD-10-CM | POA: Diagnosis present

## 2023-04-06 DIAGNOSIS — F329 Major depressive disorder, single episode, unspecified: Secondary | ICD-10-CM | POA: Diagnosis present

## 2023-04-06 DIAGNOSIS — J4489 Other specified chronic obstructive pulmonary disease: Secondary | ICD-10-CM | POA: Diagnosis present

## 2023-04-06 DIAGNOSIS — Z931 Gastrostomy status: Secondary | ICD-10-CM

## 2023-04-06 DIAGNOSIS — R569 Unspecified convulsions: Principal | ICD-10-CM

## 2023-04-06 DIAGNOSIS — G40909 Epilepsy, unspecified, not intractable, without status epilepticus: Secondary | ICD-10-CM

## 2023-04-06 DIAGNOSIS — G9345 Developmental and epileptic encephalopathy: Secondary | ICD-10-CM | POA: Diagnosis not present

## 2023-04-06 DIAGNOSIS — Z6828 Body mass index (BMI) 28.0-28.9, adult: Secondary | ICD-10-CM

## 2023-04-06 DIAGNOSIS — Z1152 Encounter for screening for COVID-19: Secondary | ICD-10-CM | POA: Diagnosis not present

## 2023-04-06 DIAGNOSIS — I10 Essential (primary) hypertension: Secondary | ICD-10-CM | POA: Diagnosis present

## 2023-04-06 DIAGNOSIS — Z79899 Other long term (current) drug therapy: Secondary | ICD-10-CM

## 2023-04-06 DIAGNOSIS — E119 Type 2 diabetes mellitus without complications: Secondary | ICD-10-CM | POA: Diagnosis present

## 2023-04-06 DIAGNOSIS — I619 Nontraumatic intracerebral hemorrhage, unspecified: Principal | ICD-10-CM | POA: Diagnosis present

## 2023-04-06 DIAGNOSIS — E440 Moderate protein-calorie malnutrition: Secondary | ICD-10-CM | POA: Diagnosis present

## 2023-04-06 DIAGNOSIS — R29810 Facial weakness: Secondary | ICD-10-CM | POA: Diagnosis present

## 2023-04-06 DIAGNOSIS — Z7401 Bed confinement status: Secondary | ICD-10-CM

## 2023-04-06 DIAGNOSIS — G8194 Hemiplegia, unspecified affecting left nondominant side: Secondary | ICD-10-CM | POA: Diagnosis present

## 2023-04-06 LAB — URINALYSIS, ROUTINE W REFLEX MICROSCOPIC
Bilirubin Urine: NEGATIVE
Glucose, UA: 50 mg/dL — AB
Hgb urine dipstick: NEGATIVE
Ketones, ur: NEGATIVE mg/dL
Nitrite: POSITIVE — AB
Protein, ur: NEGATIVE mg/dL
Specific Gravity, Urine: 1.021 (ref 1.005–1.030)
pH: 5 (ref 5.0–8.0)

## 2023-04-06 LAB — BASIC METABOLIC PANEL
Anion gap: 8 (ref 5–15)
BUN: 12 mg/dL (ref 8–23)
CO2: 27 mmol/L (ref 22–32)
Calcium: 8.5 mg/dL — ABNORMAL LOW (ref 8.9–10.3)
Chloride: 98 mmol/L (ref 98–111)
Creatinine, Ser: 0.58 mg/dL (ref 0.44–1.00)
GFR, Estimated: >60 mL/min (ref >60)
Glucose, Bld: 147 mg/dL — ABNORMAL HIGH (ref 70–99)
Potassium: 4.5 mmol/L (ref 3.5–5.1)
Sodium: 133 mmol/L — ABNORMAL LOW (ref 135–145)

## 2023-04-06 LAB — GLUCOSE, CAPILLARY
Glucose-Capillary: 110 mg/dL — ABNORMAL HIGH (ref 70–99)
Glucose-Capillary: 134 mg/dL — ABNORMAL HIGH (ref 70–99)
Glucose-Capillary: 144 mg/dL — ABNORMAL HIGH (ref 70–99)
Glucose-Capillary: 146 mg/dL — ABNORMAL HIGH (ref 70–99)
Glucose-Capillary: 152 mg/dL — ABNORMAL HIGH (ref 70–99)
Glucose-Capillary: 94 mg/dL (ref 70–99)

## 2023-04-06 LAB — URINE DRUG SCREEN, QUALITATIVE (ARMC ONLY)
Amphetamines, Ur Screen: NOT DETECTED
Barbiturates, Ur Screen: NOT DETECTED
Benzodiazepine, Ur Scrn: POSITIVE — AB
Cannabinoid 50 Ng, Ur ~~LOC~~: NOT DETECTED
Cocaine Metabolite,Ur ~~LOC~~: NOT DETECTED
MDMA (Ecstasy)Ur Screen: NOT DETECTED
Methadone Scn, Ur: NOT DETECTED
Opiate, Ur Screen: NOT DETECTED
Phencyclidine (PCP) Ur S: NOT DETECTED
Tricyclic, Ur Screen: NOT DETECTED

## 2023-04-06 LAB — PHOSPHORUS: Phosphorus: 1.8 mg/dL — ABNORMAL LOW (ref 2.5–4.6)

## 2023-04-06 LAB — MAGNESIUM: Magnesium: 1.7 mg/dL (ref 1.7–2.4)

## 2023-04-06 MED ORDER — LEVETIRACETAM IN NACL 1500 MG/100ML IV SOLN: 1500.0000 mg | Freq: Two times a day (BID) | INTRAVENOUS | Status: DC

## 2023-04-06 MED ORDER — INSULIN ASPART 100 UNIT/ML IJ SOLN: 0.0000 [IU] | Freq: Three times a day (TID) | INTRAMUSCULAR | Status: DC

## 2023-04-06 MED ORDER — ACETAMINOPHEN 650 MG RE SUPP: 650.0000 mg | Freq: Four times a day (QID) | RECTAL | Status: DC | PRN

## 2023-04-06 MED ORDER — LORAZEPAM 2 MG/ML IJ SOLN
INTRAMUSCULAR | Status: AC
  Administered 2023-04-06: 1 mg
  Filled 2023-04-06: qty 1

## 2023-04-06 MED ORDER — SODIUM CHLORIDE 0.9 % IV BOLUS
500.0000 mL | Freq: Once | INTRAVENOUS | Status: AC
  Administered 2023-04-06: 500 mL via INTRAVENOUS

## 2023-04-06 MED ORDER — INSULIN ASPART 100 UNIT/ML IJ SOLN: 0.0000 [IU] | Freq: Every day | INTRAMUSCULAR | Status: DC

## 2023-04-06 MED ORDER — ONDANSETRON HCL 4 MG/2ML IJ SOLN: 4.0000 mg | Freq: Four times a day (QID) | INTRAMUSCULAR | Status: DC | PRN

## 2023-04-06 MED ORDER — HYDRALAZINE HCL 20 MG/ML IJ SOLN
10.0000 mg | Freq: Four times a day (QID) | INTRAMUSCULAR | Status: DC | PRN
  Filled 2023-04-06: qty 1

## 2023-04-06 MED ORDER — PROSOURCE TF20 ENFIT COMPATIBL EN LIQD
60.0000 mL | Freq: Every day | ENTERAL | Status: DC
  Administered 2023-04-07 – 2023-04-12 (×6): 60 mL
  Filled 2023-04-06 (×6): qty 60

## 2023-04-06 MED ORDER — ALBUTEROL SULFATE (2.5 MG/3ML) 0.083% IN NEBU
2.5000 mg | INHALATION_SOLUTION | Freq: Four times a day (QID) | RESPIRATORY_TRACT | Status: DC | PRN
Start: 2023-04-06 — End: 2023-04-12
  Administered 2023-04-12: 2.5 mg via RESPIRATORY_TRACT
  Filled 2023-04-06: qty 3

## 2023-04-06 MED ORDER — MAGNESIUM SULFATE 2 GM/50ML IV SOLN
2.0000 g | Freq: Once | INTRAVENOUS | Status: DC
  Filled 2023-04-06: qty 50

## 2023-04-06 MED ORDER — LORAZEPAM 2 MG/ML IJ SOLN
1.0000 mg | Freq: Once | INTRAMUSCULAR | Status: AC
  Filled 2023-04-06: qty 1

## 2023-04-06 MED ORDER — HYDRALAZINE HCL 20 MG/ML IJ SOLN: 10.0000 mg | Freq: Four times a day (QID) | INTRAMUSCULAR | Status: DC | PRN

## 2023-04-06 MED ORDER — ACETAMINOPHEN 325 MG PO TABS
650.0000 mg | ORAL_TABLET | Freq: Four times a day (QID) | ORAL | Status: DC | PRN
  Administered 2023-04-07 – 2023-04-12 (×3): 650 mg via ORAL
  Filled 2023-04-06 (×3): qty 2

## 2023-04-06 MED ORDER — METOPROLOL TARTRATE 50 MG PO TABS: 50.0000 mg | ORAL_TABLET | Freq: Two times a day (BID) | ORAL | Status: DC

## 2023-04-06 MED ORDER — ENOXAPARIN SODIUM 40 MG/0.4ML IJ SOSY
40.0000 mg | PREFILLED_SYRINGE | INTRAMUSCULAR | Status: DC
Start: 2023-04-06 — End: 2023-04-09
  Administered 2023-04-06 – 2023-04-08 (×3): 40 mg via SUBCUTANEOUS
  Filled 2023-04-06 (×3): qty 0.4

## 2023-04-06 MED ORDER — LEVETIRACETAM IN NACL 1500 MG/100ML IV SOLN
1500.0000 mg | Freq: Two times a day (BID) | INTRAVENOUS | Status: DC
  Administered 2023-04-06 – 2023-04-11 (×11): 1500 mg via INTRAVENOUS
  Filled 2023-04-06 (×13): qty 100

## 2023-04-06 MED ORDER — SODIUM PHOSPHATES 45 MMOLE/15ML IV SOLN
30.0000 mmol | Freq: Once | INTRAVENOUS | Status: DC
  Administered 2023-04-06: 30 mmol via INTRAVENOUS
  Filled 2023-04-06: qty 10

## 2023-04-06 MED ORDER — SODIUM CHLORIDE 0.9 % IV SOLN: 100.0000 mg | Freq: Two times a day (BID) | INTRAVENOUS | Status: DC

## 2023-04-06 MED ORDER — FREE WATER
50.0000 mL | Status: DC
  Administered 2023-04-06 – 2023-04-12 (×34): 50 mL

## 2023-04-06 MED ORDER — INSULIN ASPART 100 UNIT/ML IJ SOLN
0.0000 [IU] | INTRAMUSCULAR | Status: DC
  Administered 2023-04-06: 1 [IU] via SUBCUTANEOUS
  Administered 2023-04-07 (×3): 2 [IU] via SUBCUTANEOUS
  Administered 2023-04-07: 1 [IU] via SUBCUTANEOUS
  Administered 2023-04-07 – 2023-04-08 (×3): 2 [IU] via SUBCUTANEOUS
  Administered 2023-04-08: 1 [IU] via SUBCUTANEOUS
  Administered 2023-04-08 (×3): 2 [IU] via SUBCUTANEOUS
  Administered 2023-04-09: 1 [IU] via SUBCUTANEOUS
  Administered 2023-04-09 (×2): 2 [IU] via SUBCUTANEOUS
  Administered 2023-04-09 (×2): 1 [IU] via SUBCUTANEOUS
  Administered 2023-04-09 – 2023-04-11 (×8): 2 [IU] via SUBCUTANEOUS
  Administered 2023-04-11: 3 [IU] via SUBCUTANEOUS
  Administered 2023-04-11 (×2): 1 [IU] via SUBCUTANEOUS
  Administered 2023-04-12: 3 [IU] via SUBCUTANEOUS

## 2023-04-06 MED ORDER — SODIUM CHLORIDE 0.9 % IV SOLN
100.0000 mg | Freq: Two times a day (BID) | INTRAVENOUS | Status: DC
  Administered 2023-04-06: 100 mg via INTRAVENOUS
  Filled 2023-04-06 (×2): qty 10

## 2023-04-06 MED ORDER — OSMOLITE 1.5 CAL PO LIQD
1000.0000 mL | ORAL | Status: DC
  Administered 2023-04-06 – 2023-04-12 (×4): 1000 mL
  Filled 2023-04-06 (×8): qty 1000

## 2023-04-06 MED ORDER — SODIUM CHLORIDE 0.9 % IV SOLN
2.0000 g | INTRAVENOUS | Status: DC
  Administered 2023-04-06: 2 g via INTRAVENOUS
  Filled 2023-04-06: qty 20

## 2023-04-06 MED ORDER — SODIUM CHLORIDE 0.9 % IV SOLN: 2.0000 g | INTRAVENOUS | Status: DC

## 2023-04-06 MED ORDER — ONDANSETRON HCL 4 MG PO TABS: 4.0000 mg | ORAL_TABLET | Freq: Four times a day (QID) | ORAL | Status: DC | PRN

## 2023-04-06 MED ORDER — IOHEXOL 350 MG/ML SOLN
75.0000 mL | Freq: Once | INTRAVENOUS | Status: AC | PRN
Start: 2023-04-06 — End: 2023-04-06
  Administered 2023-04-06: 75 mL via INTRAVENOUS

## 2023-04-06 MED ORDER — SODIUM CHLORIDE 0.9 % IV SOLN
100.0000 mg | Freq: Two times a day (BID) | INTRAVENOUS | Status: DC
  Administered 2023-04-06 – 2023-04-09 (×6): 100 mg via INTRAVENOUS
  Filled 2023-04-06 (×7): qty 10

## 2023-04-06 MED ORDER — METOPROLOL TARTRATE 50 MG PO TABS
50.0000 mg | ORAL_TABLET | Freq: Two times a day (BID) | ORAL | Status: DC
  Administered 2023-04-06 – 2023-04-12 (×12): 50 mg
  Filled 2023-04-06 (×12): qty 1

## 2023-04-06 MED ORDER — METOPROLOL TARTRATE 50 MG PO TABS
50.0000 mg | ORAL_TABLET | Freq: Two times a day (BID) | ORAL | Status: DC
  Administered 2023-04-06: 50 mg
  Filled 2023-04-06: qty 1

## 2023-04-06 MED ORDER — SODIUM CHLORIDE 0.9 % IV SOLN
2.0000 g | INTRAVENOUS | Status: DC
  Administered 2023-04-07 – 2023-04-09 (×3): 2 g via INTRAVENOUS
  Filled 2023-04-06 (×3): qty 20

## 2023-04-06 NOTE — Progress Notes (Signed)
SLP Cancellation Note  Patient Details Name: SUEZETTE LAFAVE MRN: [id number] DOB: [birth year]   Cancelled treatment:       Reason Eval/Treat Not Completed: SLP screened, no needs identified, will sign off (chart reviewed; consulted NSG re: pt's status today.)  Per NSG, pt is having seizures this morning; Ativan given, and pt is scheduled to Transfer to [facility] for continued care. Per chart, pt has been poorly responsive. NSG reported intermittent reactive responses during physical assessments.  A cognitive-communication evaluation is not appropriate at this time in setting of ongoing Acute issues of seizure/deficits w/ Baseline deficits from prior CVA.  ST services will sign off currently. Skilled ST services can be had for assessment of needs at pt's next venue of care when appropriate. NSG updated and agreed.     Jerilynn Som, MS, CCC-SLP Speech Language Pathologist Rehab Services; [facility] [phone] (ascom) Ashly Yepez 04/06/2023, 12:28 PM

## 2023-04-06 NOTE — Plan of Care (Signed)

## 2023-04-06 NOTE — Discharge Summary (Signed)
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 sinuses and mastoid air cells are clear. Unremarkable orbits. Other: None. ASPECTS [facility] Stroke Program Early CT Score) Not scored in the presence of acute hemorrhage. Critical Value/emergent results were called by telephone at the time of interpretation on 04/04/2023 at 2:23 pm to Dr. Milon Dikes, who verbally acknowledged these results. IMPRESSION: 1. 2.5 cm acute right frontoparietal parenchymal hemorrhage. 2. Extensive chronic small vessel ischemic disease. Electronically Signed   By: Sebastian Ache M.D.   On: 04/04/2023 14:24    Microbiology: Recent Results (from the past 240 hour(s))  MRSA Next Gen by PCR, Nasal     Status: None   Collection Time: 04/04/23  3:52 PM   Specimen: Nasal Mucosa; Nasal Swab  Result Value Ref Range Status   MRSA by PCR Next Gen NOT DETECTED NOT DETECTED Final    Comment: (NOTE) The GeneXpert MRSA Assay (FDA approved for NASAL specimens only), is one component of a comprehensive MRSA colonization  surveillance program. It is not intended to diagnose MRSA infection nor to guide or monitor treatment for MRSA infections. Test performance is not FDA approved in patients less than 78 years old. Performed at [facility], [address] [place], [place] [address]      Labs: CBC: Recent Labs  Lab 04/04/23 1406 04/05/23 0357  WBC 10.3 10.9*  NEUTROABS 7.4 6.7  HGB 16.3* 14.9  HCT 48.5* 45.2  MCV 91.7 92.8  PLT 204 196   Basic Metabolic Panel: Recent Labs  Lab 04/04/23 1406 04/05/23 0357 04/05/23 1455 04/06/23 0624  NA 128* 130*  --  133*  K 4.0 5.3* 5.0 4.5  CL 92* 96*  --  98  CO2 28 27  --  27  GLUCOSE 181* 73  --  147*  BUN 16 15  --  12  CREATININE 0.60 0.63  --  0.58  CALCIUM 9.0 8.8*  --  8.5*  MG 1.8 2.4  --  1.7  PHOS 3.9 3.7  --  1.8*   Liver Function Tests: Recent Labs  Lab 04/04/23 1406 04/05/23 0357  AST 68* 65*  ALT 37 33  ALKPHOS 181* 123  BILITOT 1.0 0.9  PROT 9.2* 7.8  ALBUMIN 3.3* 2.8*   No results for input(s): "LIPASE", "AMYLASE" in the last 168 hours. No results for input(s): "AMMONIA" in the last 168 hours. Cardiac Enzymes: No results for input(s): "CKTOTAL", "CKMB", "CKMBINDEX", "TROPONINI" in the last 168 hours. BNP (last 3 results) No results for input(s): "BNP" in the last 8760 hours. CBG: Recent Labs  Lab 04/05/23 2021 04/05/23 2352 04/06/23 0352 04/06/23 0730 04/06/23 1141  GLUCAP 132* 145* 110*  146* 144*    Time spent: 35 minutes  Signed:  Gillis Santa  [facility] 04/06/2023 1:48 PM

## 2023-04-06 NOTE — Plan of Care (Signed)

## 2023-04-06 NOTE — Progress Notes (Signed)
PHARMACY CONSULT NOTE - FOLLOW UP  Pharmacy Consult for Electrolyte Monitoring and Replacement   Recent Labs: Potassium (mmol/L)  Date Value  04/06/2023 4.5  12/26/2013 3.4 (L)   Magnesium (mg/dL)  Date Value  21/30/8657 1.7   Calcium (mg/dL)  Date Value  84/69/6295 8.5 (L)   Calcium, Total (mg/dL)  Date Value  28/41/3244 9.0   Albumin (g/dL)  Date Value  06/02/7251 2.8 (L)  12/26/2013 3.6   Phosphorus (mg/dL)  Date Value  66/44/0347 1.8 (L)   Sodium (mmol/L)  Date Value  04/06/2023 133 (L)  12/26/2013 138    Assessment: 67 y/o female with h/o COPD, DM, esophageal dysphagia with chronic G-tube, CVA, HTN, MDD, dementia, bedbound lives in SNF and frequent aspiration PNA who is admitted with concern for seizures and new ICH. Pharmacy is asked to follow and replace electrolytes while in CCU  Goal of Therapy:  Electrolytes WNL  Plan:  ---30 mmol sodium phosphate IV per MD ---2 grams IV magnesium sulfate x 1 ---recheck electrolytes in am  Lowella Bandy ,PharmD Clinical Pharmacist 04/06/2023 11:38 AM

## 2023-04-06 NOTE — Progress Notes (Signed)
PT Cancellation Note  Patient Details Name: VIRGEN BELLAND MRN: [id number] DOB: 11/05/1955   Cancelled Treatment:    Reason Eval/Treat Not Completed: Medical issues which prohibited therapy (Patient with possible ongoing seizure activity and not medically ready for PT at this time. PT will sign off, please re-order PT as appropriate.)  Donna Bernard, PT, MPT   Ina Homes 04/06/2023, 11:37 AM

## 2023-04-06 NOTE — Progress Notes (Signed)
NEUROLOGY CONSULT FOLLOW UP NOTE   Date of service: April 06, 2023 Patient Name: Kaitlyn Ruiz MRN:  [id number] DOB:  [birth year]  Brief HPI  Kaitlyn Ruiz is a 67 y.o. female with a history of previous stroke, MRS 5,  who presented on 04/04/2023 with  concern for seizures and new small ICH. At Baseline, she is bed bound, can mumble some, but not have a coherent conversation.    Interval Hx/subjective   She has actually had some improvement since yesterday, but while I was in the room, she had an episode of left gaze forced deviation lasting approximately 10 seconds  Vitals   Vitals:   04/06/23 0900 04/06/23 0901 04/06/23 1000 04/06/23 1056  BP: 129/60 129/60 (!) 142/59 (!) 87/48  Pulse: 90 96 74 74  Resp: (!) 24 (!) 23 (!) 29 (!) 24  Temp:      TempSrc:      SpO2: 99% 99% 98% 100%  Weight:      Height:         Body mass index is 28.84 kg/m.  Physical Exam   Gen: in bed, NAD  Neurologic Examination    Neuro: MS: opens eyesto mild stimulation, she is able to follow commands to wiggle toes, stick out tongue. Kaitlyn Ruiz initially with left gaze deviation, this lasted for approximately 10 seconds after which she is able to cross midline to the right. Left facial weakness when grimacing.  Motor: She is able to follow commands on the right arm and leg, withdraws to nox sitm on the left Sensory:as above.   Labs and Diagnostic Imaging   CBC:  Recent Labs  Lab 04/04/23 1406 04/05/23 0357  WBC 10.3 10.9*  NEUTROABS 7.4 6.7  HGB 16.3* 14.9  HCT 48.5* 45.2  MCV 91.7 92.8  PLT 204 196    Basic Metabolic Panel:  Lab Results  Component Value Date   NA 133 (L) 04/06/2023   K 4.5 04/06/2023   CO2 27 04/06/2023   GLUCOSE 147 (H) 04/06/2023   BUN 12 04/06/2023   CREATININE 0.58 04/06/2023   CALCIUM 8.5 (L) 04/06/2023   GFRNONAA >60 04/06/2023   GFRAA >60 05/29/2019   Lipid Panel:  Lab Results  Component Value Date   LDLCALC 63 04/04/2023   HgbA1c:  Lab Results   Component Value Date   HGBA1C 5.5 04/04/2023   Urine Drug Screen:     Component Value Date/Time   LABOPIA NONE DETECTED 04/06/2023 0011   COCAINSCRNUR NONE DETECTED 04/06/2023 0011   LABBENZ POSITIVE (A) 04/06/2023 0011   AMPHETMU NONE DETECTED 04/06/2023 0011   THCU NONE DETECTED 04/06/2023 0011   LABBARB NONE DETECTED 04/06/2023 0011     Imaging(Personally reviewed):    Impression   KANDICE SCHMELTER is a 67 y.o. 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  Etiology of her intraparenchymal hematoma is slightly unclear, the location is unusual for hypertension, and therefore ischemic stroke with secondary conversion I think would be one consideration, but also amyloid angiopathy would be a consideration.  The SWI sequence on MRI is extremely motion degraded and I do not think he has much additional info.  Finally venous sinus thrombosis could give hemorrhage in this location and therefore I  will also order a CT venogram.  Recommendations   Ativan 1 mg x 1 given Keppra increased to 1500 twice daily Start Vimpat 100 twice daily CT venogram Recommend transfer for continuous EEG monitoring. ________________________________________________    Thank you for the opportunity to take part in the care of this patient. If you have any further questions, please contact the neurology consultation team on call. Updated oncall schedule is listed on AMION.  Signed,  Ritta Slot Neurohospitalist

## 2023-04-06 NOTE — Progress Notes (Signed)
Pt was given 1mg  ativan per neuro md for seizures. Pts bp then dropped as well as pt became more drowsy, notified MD, 500cc bolus ordered and given. Will continue to monitor

## 2023-04-06 NOTE — Progress Notes (Signed)
OT Cancellation Note  Patient Details Name: Kaitlyn Ruiz MRN: [id number] DOB: July 16, 1955   Cancelled Treatment:    Reason Eval/Treat Not Completed: Other (comment) (attempted to see pt, nurse reports having seizures, work up ongoing, recently medicated with ativan. Pt appears not medically ready for therapy at this time. Will sign off at this time, nurse/neuro notified to re consult as appropriate.)  Oleta Mouse, OTD OTR/L  04/06/23, 11:24 AM

## 2023-04-06 NOTE — H&P (Addendum)
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 04/05/2023 CLINICAL DATA:  Elevated alkaline phosphatase EXAM: ULTRASOUND ABDOMEN LIMITED RIGHT UPPER QUADRANT COMPARISON:  None Available. FINDINGS: Gallbladder: No mobile gallstones. Multiple small adherent shadowing gallstones. No gallbladder wall thickening. No sonographic Murphy sign noted by sonographer. Common bile duct: Common bile duct not visualized. Liver: No focal lesion identified. Increased parenchymal echogenicity. Portal vein is patent on color Doppler imaging with normal direction of blood flow towards the liver. Other: None. IMPRESSION: 1. Cholelithiasis without sonographic evidence of acute cholecystitis. 2. Common bile duct not visualized. 3. Hepatic steatosis. Electronically Signed   By: Jearld Lesch M.D.   On: 04/05/2023 11:42   CT HEAD WO CONTRAST  Result Date: 04/04/2023 CLINICAL DATA:  Hemorrhagic stroke EXAM: CT HEAD  WITHOUT CONTRAST TECHNIQUE: Contiguous axial images were obtained from the base of the skull through the vertex without intravenous contrast. RADIATION DOSE REDUCTION: This exam was performed according to the departmental dose-optimization program which includes automated exposure control, adjustment of the mA and/or kV according to patient size and/or use of iterative reconstruction technique. COMPARISON:  2:13 p.m. FINDINGS: Brain: Unchanged appearance of intraparenchymal hematoma in the high right frontal lobe, 2.0 x 1.2 cm. No new site of hemorrhage. Diffuse hypoattenuation of the white matter. CSF spaces are normal. Vascular: There is atherosclerotic calcification of both internal carotid arteries at the skull base. Skull: Negative Sinuses/Orbits: Paranasal sinuses are clear. No mastoid effusion. Normal orbits. Other: None IMPRESSION: Unchanged appearance of intraparenchymal hematoma in the high right frontal lobe, 2.0 x 1.2 cm. No new site of hemorrhage. Electronically Signed   By: Deatra Robinson M.D.   On: 04/04/2023 21:15     Signed, Lorin Glass, MD [facility] 04/06/2023

## 2023-04-07 ENCOUNTER — Inpatient Hospital Stay (HOSPITAL_COMMUNITY): Payer: Medicare Other

## 2023-04-07 DIAGNOSIS — I619 Nontraumatic intracerebral hemorrhage, unspecified: Secondary | ICD-10-CM | POA: Insufficient documentation

## 2023-04-07 DIAGNOSIS — Z931 Gastrostomy status: Secondary | ICD-10-CM

## 2023-04-07 DIAGNOSIS — I611 Nontraumatic intracerebral hemorrhage in hemisphere, cortical: Secondary | ICD-10-CM | POA: Diagnosis not present

## 2023-04-07 DIAGNOSIS — G40909 Epilepsy, unspecified, not intractable, without status epilepticus: Secondary | ICD-10-CM

## 2023-04-07 DIAGNOSIS — G9341 Metabolic encephalopathy: Secondary | ICD-10-CM

## 2023-04-07 DIAGNOSIS — E119 Type 2 diabetes mellitus without complications: Secondary | ICD-10-CM

## 2023-04-07 DIAGNOSIS — R569 Unspecified convulsions: Secondary | ICD-10-CM | POA: Diagnosis not present

## 2023-04-07 LAB — BASIC METABOLIC PANEL
Anion gap: 10 (ref 5–15)
BUN: 13 mg/dL (ref 8–23)
CO2: 25 mmol/L (ref 22–32)
Calcium: 8.7 mg/dL — ABNORMAL LOW (ref 8.9–10.3)
Chloride: 100 mmol/L (ref 98–111)
Creatinine, Ser: 0.54 mg/dL (ref 0.44–1.00)
GFR, Estimated: >60 mL/min (ref >60)
Glucose, Bld: 141 mg/dL — ABNORMAL HIGH (ref 70–99)
Potassium: 4.3 mmol/L (ref 3.5–5.1)
Sodium: 135 mmol/L (ref 135–145)

## 2023-04-07 LAB — CBC
HCT: 42.9 % (ref 36.0–46.0)
Hemoglobin: 14.4 g/dL (ref 12.0–15.0)
MCH: 30.3 pg (ref 26.0–34.0)
MCHC: 33.6 g/dL (ref 30.0–36.0)
MCV: 90.1 fL (ref 80.0–100.0)
Platelets: 187 10*3/uL (ref 150–400)
RBC: 4.76 MIL/uL (ref 3.87–5.11)
RDW: 13.2 % (ref 11.5–15.5)
WBC: 7.3 10*3/uL (ref 4.0–10.5)
nRBC: 0 % (ref 0.0–0.2)

## 2023-04-07 LAB — GLUCOSE, CAPILLARY
Glucose-Capillary: 102 mg/dL — ABNORMAL HIGH (ref 70–99)
Glucose-Capillary: 148 mg/dL — ABNORMAL HIGH (ref 70–99)
Glucose-Capillary: 151 mg/dL — ABNORMAL HIGH (ref 70–99)
Glucose-Capillary: 152 mg/dL — ABNORMAL HIGH (ref 70–99)
Glucose-Capillary: 154 mg/dL — ABNORMAL HIGH (ref 70–99)
Glucose-Capillary: 157 mg/dL — ABNORMAL HIGH (ref 70–99)

## 2023-04-07 LAB — MAGNESIUM: Magnesium: 1.7 mg/dL (ref 1.7–2.4)

## 2023-04-07 LAB — PHOSPHORUS: Phosphorus: 2.6 mg/dL (ref 2.5–4.6)

## 2023-04-07 MED ORDER — MIDAZOLAM HCL 2 MG/2ML IJ SOLN: 2.0000 mg | INTRAMUSCULAR | Status: DC | PRN
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
## 2023-04-07 NOTE — Plan of Care (Signed)
  Problem: Education: Goal: Knowledge of General Education information will improve Description: Including pain rating scale, medication(s)/side effects and non-pharmacologic comfort measures Outcome: Progressing   Problem: Health Behavior/Discharge Planning: Goal: Ability to manage health-related needs will improve Outcome: Progressing   Problem: Clinical Measurements: Goal: Ability to maintain clinical measurements within normal limits will improve Outcome: Progressing   Problem: Nutrition: Goal: Adequate nutrition will be maintained Outcome: Progressing   Problem: Pain Management: Goal: General experience of comfort will improve Outcome: Progressing

## 2023-04-07 NOTE — Progress Notes (Signed)
EEG complete - results pending 

## 2023-04-07 NOTE — Hospital Course (Addendum)
67 year old woman PMH including dementia, diabetes mellitus type 2, stroke, long-term nursing facility resident, bedbound, cognitive deficits with minimal speech, hospitalized at [facility] 11/4 for evaluation of seizure-like activity. Code stroke was called and CT showed 2.5 cm acute right frontoparietal parenchymal hemorrhage, extensive chronic small vessel ischemic disease. She was admitted to ICU for ICH leading to seizure activity, started on Keppra and nicardipine for blood pressure control. 11/4, EEG did not show seizure or epileptiform discharges but showed severe diffuse encephalopathy. 11/4, repeat CT head did not show any change in the size of hematoma. Neurology was consulted.  Patient was deemed not a candidate for surgery. Keppra and Vimpat were started 11/6, gradually weaned off nicardipine drip and transferred out off ICU. 11/6, MRI brain was obtained which did not show any evidence of infarct or mass, showed severe chronic vessel ischemic disease with numerous remote microhemorrhages and lacunar infarcts. 11/6, CT venogram head did not show any change in size of parenchymal hemorrhage, did not show any dural sinus venous thrombosis.  Showed extensive chronic small vessel ischemia. Neurology recommended transfer to [facility] for long-term EEG monitoring. Patient seen by neurology and underwent long-term video EEG monitoring without recorded seizures.  Ultimately this was discontinued and neurology made final recommendations.  Repeat CT head showed stability of hematoma.  Plan for return to SNF as early as 11/12.  Consultants Neurology  Procedures

## 2023-04-07 NOTE — Progress Notes (Signed)
LTM setup all impedances under 10k [facility] to Monitor
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
## 2023-04-07 NOTE — Procedures (Addendum)
Patient Name: Kaitlyn Ruiz  MRN: [id number]  Epilepsy Attending: Charlsie Quest  Referring Physician/Provider: Jefferson Fuel, MD  Date: 04/07/2023 Duration: 23.18 mins  Patient history: 67 yo F with sudden onset of seizure-like activity on the left side with left-sided weakness and unresponsiveness. EEG to evaluate for seizure  Level of alertness: Awake  AEDs during EEG study: LEV, LCM  Technical aspects: This EEG study was done with scalp electrodes positioned according to the 10-20 International system of electrode placement. Electrical activity was reviewed with band pass filter of 1-70Hz , sensitivity of 7 uV/mm, display speed of 42mm/sec with a 60Hz  notched filter applied as appropriate. EEG data were recorded continuously and digitally stored.  Video monitoring was available and reviewed as appropriate.  Description:  EEG showed continuous generalized and lateralized right hemisphere 3 to 6 Hz theta-delta slowing.  Sharp waves were noted in right frontotemporal region.. Hyperventilation and photic stimulation were not performed.      ABNORMALITY - Continuous slow, generalized and lateralized right hemisphere  -Sharp waves, right frontotemporal region   IMPRESSION: This study showed evidence of epileptogenicity arising from right frontotemporal region.  Additionally there is cortical dysfunction arising from right hemisphere likely secondary to underlying ICH.  Lastly there is moderate diffuse encephalopathy. No seizures were seen throughout the recording.   Juelle Dickmann Annabelle Harman

## 2023-04-07 NOTE — Progress Notes (Signed)
Subjective: No reported seizures.  However her exam appears worse than Dr. Alene Mires exam yesterday (not following commands today)  ROS: Unable to obtain due to poor mental status  Examination  Vital signs in last 24 hours: Temp:  [97.4 F (36.3 C)-98.6 F (37 C)] 98.6 F (37 C) (11/07 0415) Pulse Rate:  [62-74] 71 (11/07 0807) Resp:  [16-27] 18 (11/07 0807) BP: (104-149)/(53-70) 149/70 (11/07 0807) SpO2:  [96 %-100 %] 99 % (11/07 0807) Weight:  [79.1 kg] 79.1 kg (11/06 1912)  General: lying in bed NAD Neuro: Opens eyes to verbal stimulation but did not follow any commands, did not name objects, was nonverbal, pupils appear equally round and reactive, does track examiner in room, was able to move the right upper and right lower extremity with antigravity strength, did appear to have at least 2/5 strength in left upper extremity and 1/5 in left lower extremity but unable to assess in more detail as patient was not following commands   Basic Metabolic Panel: Recent Labs  Lab 04/04/23 1406 04/05/23 0357 04/05/23 1455 04/06/23 0624 04/07/23 0646  NA 128* 130*  --  133* 135  K 4.0 5.3* 5.0 4.5 4.3  CL 92* 96*  --  98 100  CO2 28 27  --  27 25  GLUCOSE 181* 73  --  147* 141*  BUN 16 15  --  12 13  CREATININE 0.60 0.63  --  0.58 0.54  CALCIUM 9.0 8.8*  --  8.5* 8.7*  MG 1.8 2.4  --  1.7 1.7  PHOS 3.9 3.7  --  1.8* 2.6    CBC: Recent Labs  Lab 04/04/23 1406 04/05/23 0357 04/07/23 0646  WBC 10.3 10.9* 7.3  NEUTROABS 7.4 6.7  --   HGB 16.3* 14.9 14.4  HCT 48.5* 45.2 42.9  MCV 91.7 92.8 90.1  PLT 204 196 187     Coagulation Studies: Recent Labs    04/04/23 1406  LABPROT 13.8  INR 1.0    Imaging personally reviewed  CTV head 04/06/2023: Unchanged right frontal parietal hemorrhage.  No dural venous sinus thrombosis  MRI brain without contrast 04/05/2023:No evidence of infarct or mass underlying the patient's right parietal hematoma. Severe chronic small vessel  disease with numerous remote micro hemorrhages and lacunar infarcts.  ASSESSMENT AND PLAN: 67 year old female with right frontal ICH presented with seizures.  Right frontal ICH New onset epilepsy -Although no clinical seizures were noted, patient exam appears worse than yesterday.  Will start video EEG monitoring and adjust antiseizure medications based on EEG findings. -In the meantime continue Keppra 1500 mg twice daily and Vimpat 100 mg twice daily -Continue seizure precautions -As needed IV Versed for clinical seizures -Discussed plan with Dr. Irene Limbo via secure chat  I have spent a total of  38  minutes with the patient reviewing hospital notes,  test results, labs and examining the patient as well as establishing an assessment and plan.  > 50% of time was spent in direct patient care.       Lindie Spruce Epilepsy [facility] For questions after 5pm please refer to AMION to reach the Neurologist on call

## 2023-04-08 DIAGNOSIS — I611 Nontraumatic intracerebral hemorrhage in hemisphere, cortical: Secondary | ICD-10-CM | POA: Diagnosis not present

## 2023-04-08 DIAGNOSIS — G9341 Metabolic encephalopathy: Secondary | ICD-10-CM | POA: Diagnosis not present

## 2023-04-08 DIAGNOSIS — I69391 Dysphagia following cerebral infarction: Secondary | ICD-10-CM

## 2023-04-08 DIAGNOSIS — R569 Unspecified convulsions: Secondary | ICD-10-CM | POA: Diagnosis not present

## 2023-04-08 DIAGNOSIS — I69322 Dysarthria following cerebral infarction: Secondary | ICD-10-CM

## 2023-04-08 DIAGNOSIS — G40909 Epilepsy, unspecified, not intractable, without status epilepticus: Secondary | ICD-10-CM | POA: Diagnosis not present

## 2023-04-08 LAB — GLUCOSE, CAPILLARY
Glucose-Capillary: 137 mg/dL — ABNORMAL HIGH (ref 70–99)
Glucose-Capillary: 140 mg/dL — ABNORMAL HIGH (ref 70–99)
Glucose-Capillary: 152 mg/dL — ABNORMAL HIGH (ref 70–99)
Glucose-Capillary: 155 mg/dL — ABNORMAL HIGH (ref 70–99)
Glucose-Capillary: 161 mg/dL — ABNORMAL HIGH (ref 70–99)
Glucose-Capillary: 166 mg/dL — ABNORMAL HIGH (ref 70–99)

## 2023-04-08 NOTE — Plan of Care (Signed)
  Problem: Education: Goal: Knowledge of General Education information will improve Description: Including pain rating scale, medication(s)/side effects and non-pharmacologic comfort measures Outcome: Progressing   Problem: Health Behavior/Discharge Planning: Goal: Ability to manage health-related needs will improve Outcome: Progressing   Problem: Nutrition: Goal: Adequate nutrition will be maintained Outcome: Progressing   Problem: Pain Management: Goal: General experience of comfort will improve Outcome: Progressing
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
## 2023-04-08 NOTE — Procedures (Addendum)
Patient Name: Kaitlyn Ruiz  MRN: [id number]  Epilepsy Attending: Charlsie Quest  Referring Physician/Provider: Charlsie Quest, MD  Duration: 04/07/2023 1414 to 04/08/2023 1414   Patient history: 67 yo F with sudden onset of seizure-like activity on the left side with left-sided weakness and unresponsiveness. EEG to evaluate for seizure   Level of alertness: Awake, asleep   AEDs during EEG study: LEV, LCM   Technical aspects: This EEG study was done with scalp electrodes positioned according to the 10-20 International system of electrode placement. Electrical activity was reviewed with band pass filter of 1-70Hz , sensitivity of 7 uV/mm, display speed of 78mm/sec with a 60Hz  notched filter applied as appropriate. EEG data were recorded continuously and digitally stored.  Video monitoring was available and reviewed as appropriate.   Description: No clear posterior dominant rhythm was seen.  Sleep was characterized by sleep spindles (12 to 14 Hz), maximal frontocentral region. EEG showed continuous generalized and lateralized right hemisphere 3 to 6 Hz theta-delta slowing. Sharp waves were noted in right frontotemporal region. Hyperventilation and photic stimulation were not performed.      ABNORMALITY - Continuous slow, generalized and lateralized right hemisphere  - Sharp waves, right frontotemporal region   IMPRESSION: This study showed evidence of epileptogenicity arising from right frontotemporal region. Additionally there is cortical dysfunction arising from right hemisphere likely secondary to underlying ICH. Lastly there is moderate diffuse encephalopathy. No seizures were seen throughout the recording.   Tatum Corl Annabelle Harman

## 2023-04-08 NOTE — Progress Notes (Addendum)
Subjective: No acute events overnight.  Exam improved today.  ROS: Unable to obtain due to poor mental status  Examination  Vital signs in last 24 hours: Temp:  [97.6 F (36.4 C)-99 F (37.2 C)] 98.4 F (36.9 C) (11/08 0401) Pulse Rate:  [66-82] 66 (11/08 0401) Resp:  [17-18] 17 (11/08 0401) BP: (134-182)/(63-93) 161/73 (11/08 0401) SpO2:  [88 %-99 %] 99 % (11/08 0401)  General: lying in bed, NAD Neuro: Opens eyes to verbal stimulation, nods appropriately to answer orientation questions, follows commands but remains nonverbal, appears to have left facial droop, antigravity strength in right upper and right lower extremity, does appear to have some movement in left upper and left lower extremity but difficult to examine strength as patient is unable to cooperate  Basic Metabolic Panel: Recent Labs  Lab 04/04/23 1406 04/05/23 0357 04/05/23 1455 04/06/23 0624 04/07/23 0646  NA 128* 130*  --  133* 135  K 4.0 5.3* 5.0 4.5 4.3  CL 92* 96*  --  98 100  CO2 28 27  --  27 25  GLUCOSE 181* 73  --  147* 141*  BUN 16 15  --  12 13  CREATININE 0.60 0.63  --  0.58 0.54  CALCIUM 9.0 8.8*  --  8.5* 8.7*  MG 1.8 2.4  --  1.7 1.7  PHOS 3.9 3.7  --  1.8* 2.6    CBC: Recent Labs  Lab 04/04/23 1406 04/05/23 0357 04/07/23 0646  WBC 10.3 10.9* 7.3  NEUTROABS 7.4 6.7  --   HGB 16.3* 14.9 14.4  HCT 48.5* 45.2 42.9  MCV 91.7 92.8 90.1  PLT 204 196 187     Coagulation Studies: No results for input(s): "LABPROT", "INR" in the last 72 hours.  Imaging No new brain imaging overnight.   ASSESSMENT AND PLAN: 67 year old female with right frontal ICH presented with seizures.   Right frontal ICH New onset epilepsy -Exam appears to be improving.  Will continue EEG monitoring for 24 more hours and then DC tomorrow if remains negative -Continue Keppra 1500 mg twice daily and Vimpat 100 mg twice daily -Continue seizure precautions -As needed IV Versed for clinical seizures -Discussed  plan with Dr. Irene Limbo via secure chat   I have spent a total of  26  minutes with the patient reviewing hospital notes,  test results, labs and examining the patient as well as establishing an assessment and plan.  > 50% of time was spent in direct patient care.      Lindie Spruce Epilepsy [facility] For questions after 5pm please refer to AMION to reach the Neurologist on call

## 2023-04-09 DIAGNOSIS — I611 Nontraumatic intracerebral hemorrhage in hemisphere, cortical: Secondary | ICD-10-CM | POA: Diagnosis not present

## 2023-04-09 DIAGNOSIS — G40909 Epilepsy, unspecified, not intractable, without status epilepticus: Secondary | ICD-10-CM | POA: Diagnosis not present

## 2023-04-09 DIAGNOSIS — R569 Unspecified convulsions: Secondary | ICD-10-CM | POA: Diagnosis not present

## 2023-04-09 DIAGNOSIS — Z931 Gastrostomy status: Secondary | ICD-10-CM | POA: Diagnosis not present

## 2023-04-09 LAB — GLUCOSE, CAPILLARY
Glucose-Capillary: 135 mg/dL — ABNORMAL HIGH (ref 70–99)
Glucose-Capillary: 164 mg/dL — ABNORMAL HIGH (ref 70–99)
Glucose-Capillary: 141 mg/dL — ABNORMAL HIGH (ref 70–99)
Glucose-Capillary: 191 mg/dL — ABNORMAL HIGH (ref 70–99)
Glucose-Capillary: 194 mg/dL — ABNORMAL HIGH (ref 70–99)

## 2023-04-09 MED ORDER — SODIUM CHLORIDE 0.9 % IV SOLN
150.0000 mg | Freq: Two times a day (BID) | INTRAVENOUS | Status: DC
  Administered 2023-04-09 – 2023-04-11 (×5): 150 mg via INTRAVENOUS
  Filled 2023-04-09 (×7): qty 15

## 2023-04-09 NOTE — Procedures (Addendum)
Patient Name: Kaitlyn Ruiz  MRN: [id number]  Epilepsy Attending: Charlsie Quest  Referring Physician/Provider: Charlsie Quest, MD  Duration: 04/08/2023 1414 to 04/09/2023 1414   Patient history: 67 yo F with sudden onset of seizure-like activity on the left side with left-sided weakness and unresponsiveness. EEG to evaluate for seizure   Level of alertness: Awake, asleep   AEDs during EEG study: LEV, LCM   Technical aspects: This EEG study was done with scalp electrodes positioned according to the 10-20 International system of electrode placement. Electrical activity was reviewed with band pass filter of 1-70Hz , sensitivity of 7 uV/mm, display speed of 75mm/sec with a 60Hz  notched filter applied as appropriate. EEG data were recorded continuously and digitally stored.  Video monitoring was available and reviewed as appropriate.   Description: No clear posterior dominant rhythm was seen. Sleep was characterized by sleep spindles (12 to 14 Hz), maximal frontocentral region. EEG showed continuous generalized and lateralized right hemisphere 3 to 6 Hz theta-delta slowing. Hyperventilation and photic stimulation were not performed.     Per chart review, patient was noted to have twitching on right side of mouth ( not seen on camera as camera is on patient's left side) between 1020 to 1100 on 04/09/2023. Concomitant eeg didn't show any eeg change to suggest seizure.    ABNORMALITY - Continuous slow, generalized and lateralized right hemisphere    IMPRESSION: This study is suggestive of cortical dysfunction arising from right hemisphere likely secondary to underlying ICH. Additionally there is moderate diffuse encephalopathy. No definite seizures were seen throughout the recording.  Per chart review, patient was noted to have twitching on right side of mouth between 10/20 to 1100 on 04/09/2023  without concomitant EEG change. This was most likely not epileptic. However, focal motor seizure amy not  be seen on scalp EEG.. Therefore, clinical correlation is recommended.    Joelene Barriere Annabelle Harman

## 2023-04-09 NOTE — Progress Notes (Signed)
Neurology Progress Note  Subjective: No acute events overnight.  Patient remains on LTM EEG monitoring. On exam this morning, patient was noted to have constant right mouth twitching (noted at 11:10 AM and 10:20 AM)   ROS: Unable to obtain due to poor mental status  Examination  Vital signs in last 24 hours: Temp:  [98.2 F (36.8 C)-98.9 F (37.2 C)] 98.2 F (36.8 C) (11/09 0743) Pulse Rate:  [62-75] 75 (11/09 0743) Resp:  [16-18] 16 (11/09 0743) BP: (137-170)/(60-75) 170/75 (11/09 0743) SpO2:  [93 %-99 %] 93 % (11/09 0743) Weight:  [78.7 kg] 78.7 kg (11/09 0500)  General: lying in bed, awake, NAD.  Patient is intermittently tearful when examiner explains that she is in the hospital with concerns for ICH and seizure.  Neuro: Opens eyes to verbal stimulation and spontaneously, appears to nod appropriately to answer orientation questions though she does nod "yes" to knowing where she is currently and then "no" to home and hospital, follows commands intermittently but remains nonverbal (though she does mouth and attempt to state her first name once on exam this morning), appears to have left facial droop ongoing with drool from her left mouth, antigravity strength in right upper and right lower extremity, does appear to have some movement in left upper and left lower extremity but difficult to examine strength as patient is unable to cooperate  Basic Metabolic Panel: Recent Labs  Lab 04/04/23 1406 04/05/23 0357 04/05/23 1455 04/06/23 0624 04/07/23 0646  NA 128* 130*  --  133* 135  K 4.0 5.3* 5.0 4.5 4.3  CL 92* 96*  --  98 100  CO2 28 27  --  27 25  GLUCOSE 181* 73  --  147* 141*  BUN 16 15  --  12 13  CREATININE 0.60 0.63  --  0.58 0.54  CALCIUM 9.0 8.8*  --  8.5* 8.7*  MG 1.8 2.4  --  1.7 1.7  PHOS 3.9 3.7  --  1.8* 2.6   CBC: Recent Labs  Lab 04/04/23 1406 04/05/23 0357 04/07/23 0646  WBC 10.3 10.9* 7.3  NEUTROABS 7.4 6.7  --   HGB 16.3* 14.9 14.4  HCT 48.5* 45.2  42.9  MCV 91.7 92.8 90.1  PLT 204 196 187   Coagulation Studies: No results for input(s): "LABPROT", "INR" in the last 72 hours.  Imaging Overnight EEG monitoring with cortical dysfunction from the right hemisphere likely secondary to underlying ICH and moderate diffuse encephalopathy without identified seizures.   ASSESSMENT AND PLAN: 67 year old female with right frontal ICH presented with seizures.   Right frontal ICH New onset epilepsy -Exam appears to be stable/improving with newly noted right mouth twitching (pending EEG review).  Will continue EEG monitoring for 24 more hours and then DC tomorrow if remains negative and exam remains stable -Continue Keppra 1500 mg twice daily; increase vimpat to 150mg  bid 2/2 episodes of mouth twitching c/w small focal seizure -Continue seizure precautions -As needed IV Versed for clinical seizures  Lanae Boast, AGACNP-BC [facility] Pager: [phone]   Attending Neurohospitalist Addendum Patient seen and examined with APP/Resident. Agree with the history and physical as documented above. Agree with the plan as documented, which I helped formulate. I have edited the note above to reflect my full findings and recommendations. I have independently reviewed the chart, obtained history, review of systems and examined the patient.I have personally reviewed pertinent head/neck/spine imaging (CT/MRI). Please feel free to call with any questions.  -- Bing Neighbors, MD  [facility] [phone]  If 7pm- 7am, please page neurology on call as listed in AMION.
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
## 2023-04-10 ENCOUNTER — Inpatient Hospital Stay (HOSPITAL_COMMUNITY): Payer: Medicare Other

## 2023-04-10 DIAGNOSIS — R569 Unspecified convulsions: Secondary | ICD-10-CM | POA: Diagnosis not present

## 2023-04-10 DIAGNOSIS — E119 Type 2 diabetes mellitus without complications: Secondary | ICD-10-CM | POA: Diagnosis not present

## 2023-04-10 DIAGNOSIS — G40909 Epilepsy, unspecified, not intractable, without status epilepticus: Secondary | ICD-10-CM | POA: Diagnosis not present

## 2023-04-10 DIAGNOSIS — I611 Nontraumatic intracerebral hemorrhage in hemisphere, cortical: Secondary | ICD-10-CM | POA: Diagnosis not present

## 2023-04-10 LAB — GLUCOSE, CAPILLARY
Glucose-Capillary: 197 mg/dL — ABNORMAL HIGH (ref 70–99)
Glucose-Capillary: 153 mg/dL — ABNORMAL HIGH (ref 70–99)
Glucose-Capillary: 176 mg/dL — ABNORMAL HIGH (ref 70–99)
Glucose-Capillary: 179 mg/dL — ABNORMAL HIGH (ref 70–99)
Glucose-Capillary: 193 mg/dL — ABNORMAL HIGH (ref 70–99)

## 2023-04-10 MED ORDER — CITALOPRAM HYDROBROMIDE 20 MG PO TABS
10.0000 mg | ORAL_TABLET | Freq: Every day | ORAL | Status: DC
  Administered 2023-04-10 – 2023-04-12 (×3): 10 mg
  Filled 2023-04-10 (×3): qty 1

## 2023-04-10 MED ORDER — FLUDROCORTISONE ACETATE 0.1 MG PO TABS
0.1000 mg | ORAL_TABLET | Freq: Every day | ORAL | Status: DC
  Administered 2023-04-10 – 2023-04-12 (×3): 0.1 mg
  Filled 2023-04-10 (×3): qty 1
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
## 2023-04-10 NOTE — Plan of Care (Signed)

## 2023-04-10 NOTE — Progress Notes (Addendum)
Neurology Progress Note  Subjective: Patient remains nonverbal but is able to follow simple commands.  No twitching noted, along with no gaze deviation  ROS: Unable to obtain due to poor mental status  Examination  Vital signs in last 24 hours: Temp:  [98.1 F (36.7 C)-98.8 F (37.1 C)] 98.1 F (36.7 C) (11/10 0722) Pulse Rate:  [72-76] 72 (11/10 0722) Resp:  [16-17] 17 (11/10 0722) BP: (128-140)/(63-71) 140/71 (11/10 0722) SpO2:  [91 %-97 %] 96 % (11/10 0722)  General: lying in bed, awake, NAD, in no acute distress Neuro: Patient rests with eyes open and tracks the examiner around the room.  No gaze deviation noted along with no facial twitching.  Patient is largely nonverbal but occasionally says okay does not answer to questions.  She is able to follow simple commands with right upper extremity and bilateral lower extremities.  No spontaneous movement of left upper extremity.  Basic Metabolic Panel: Recent Labs  Lab 04/04/23 1406 04/05/23 0357 04/05/23 1455 04/06/23 0624 04/07/23 0646  NA 128* 130*  --  133* 135  K 4.0 5.3* 5.0 4.5 4.3  CL 92* 96*  --  98 100  CO2 28 27  --  27 25  GLUCOSE 181* 73  --  147* 141*  BUN 16 15  --  12 13  CREATININE 0.60 0.63  --  0.58 0.54  CALCIUM 9.0 8.8*  --  8.5* 8.7*  MG 1.8 2.4  --  1.7 1.7  PHOS 3.9 3.7  --  1.8* 2.6   CBC: Recent Labs  Lab 04/04/23 1406 04/05/23 0357 04/07/23 0646  WBC 10.3 10.9* 7.3  NEUTROABS 7.4 6.7  --   HGB 16.3* 14.9 14.4  HCT 48.5* 45.2 42.9  MCV 91.7 92.8 90.1  PLT 204 196 187   Coagulation Studies: No results for input(s): "LABPROT", "INR" in the last 72 hours.  Imaging Overnight EEG monitoring again demonstrates continuous slow generalized and lateralized right hemisphere, suggestive of cortical dysfunction arising from right hemisphere with moderate diffuse encephalopathy and no seizure activity  ASSESSMENT AND PLAN: 67 year old female with right frontal ICH presented with seizures.    Right frontal ICH New onset epilepsy -Patient appears to be mentally at her baseline today, able to track examiner and state okay in response to questions and follow simple commands.  No seizures seen on EEG today.  Given that there is no more mouth twitching, will discontinue long-term EEG. -Continue Keppra 1500 mg twice daily; continue vimpat at 150mg  bid  -Continue seizure precautions -As needed IV Versed for clinical seizures - Outpatient f/u with neurology, I will arrange -Neurology will sign off, please call with questions or concerns  Patient seen by NP and then by MD, MD to edit note as needed. Cortney E Ernestina Columbia , MSN, AGACNP-BC [facility] See Amion for schedule and pager information 04/10/2023 8:47 AM    Attending Neurohospitalist Addendum Patient seen and examined with APP/Resident. Agree with the history and physical as documented above. Agree with the plan as documented, which I helped formulate. I have edited the note above to reflect my full findings and recommendations. I have independently reviewed the chart, obtained history, review of systems and examined the patient.I have personally reviewed pertinent head/neck/spine imaging (CT/MRI). Please feel free to call with any questions.  -- Bing Neighbors, MD [facility] [phone]  If 7pm- 7am, please page neurology on call as listed in AMION.

## 2023-04-10 NOTE — Progress Notes (Signed)
LTM EEG discontinued - no skin breakdown at unhook.   

## 2023-04-10 NOTE — Procedures (Addendum)
Patient Name: Kaitlyn Ruiz  MRN: [id number]  Epilepsy Attending: Charlsie Quest  Referring Physician/Provider: Charlsie Quest, MD  Duration: 04/09/2023 1414 to 04/10/2023 0254   Patient history: 67 yo F with sudden onset of seizure-like activity on the left side with left-sided weakness and unresponsiveness. EEG to evaluate for seizure   Level of alertness: Awake, asleep   AEDs during EEG study: LEV, LCM   Technical aspects: This EEG study was done with scalp electrodes positioned according to the 10-20 International system of electrode placement. Electrical activity was reviewed with band pass filter of 1-70Hz , sensitivity of 7 uV/mm, display speed of 67mm/sec with a 60Hz  notched filter applied as appropriate. EEG data were recorded continuously and digitally stored.  Video monitoring was available and reviewed as appropriate.   Description: No clear posterior dominant rhythm was seen. Sleep was characterized by sleep spindles (12 to 14 Hz), maximal frontocentral region. EEG showed continuous generalized and lateralized right hemisphere 3 to 6 Hz theta-delta slowing. Hyperventilation and photic stimulation were not performed.      ABNORMALITY - Continuous slow, generalized and lateralized right hemisphere    IMPRESSION: This study is suggestive of cortical dysfunction arising from right hemisphere likely secondary to underlying ICH. Additionally there is moderate diffuse encephalopathy. No seizures were seen throughout the recording.   Mareesa Gathright Annabelle Harman

## 2023-04-11 DIAGNOSIS — I69322 Dysarthria following cerebral infarction: Secondary | ICD-10-CM | POA: Diagnosis not present

## 2023-04-11 DIAGNOSIS — I611 Nontraumatic intracerebral hemorrhage in hemisphere, cortical: Secondary | ICD-10-CM | POA: Diagnosis not present

## 2023-04-11 DIAGNOSIS — G40909 Epilepsy, unspecified, not intractable, without status epilepticus: Secondary | ICD-10-CM | POA: Diagnosis not present

## 2023-04-11 DIAGNOSIS — E119 Type 2 diabetes mellitus without complications: Secondary | ICD-10-CM | POA: Diagnosis not present

## 2023-04-11 LAB — GLUCOSE, CAPILLARY
Glucose-Capillary: 145 mg/dL — ABNORMAL HIGH (ref 70–99)
Glucose-Capillary: 145 mg/dL — ABNORMAL HIGH (ref 70–99)
Glucose-Capillary: 158 mg/dL — ABNORMAL HIGH (ref 70–99)
Glucose-Capillary: 179 mg/dL — ABNORMAL HIGH (ref 70–99)
Glucose-Capillary: 203 mg/dL — ABNORMAL HIGH (ref 70–99)
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
## 2023-04-11 NOTE — Plan of Care (Signed)

## 2023-04-12 ENCOUNTER — Inpatient Hospital Stay (HOSPITAL_COMMUNITY): Payer: Medicare Other

## 2023-04-12 DIAGNOSIS — I611 Nontraumatic intracerebral hemorrhage in hemisphere, cortical: Secondary | ICD-10-CM | POA: Diagnosis not present

## 2023-04-12 LAB — GLUCOSE, CAPILLARY
Glucose-Capillary: 105 mg/dL — ABNORMAL HIGH (ref 70–99)
Glucose-Capillary: 108 mg/dL — ABNORMAL HIGH (ref 70–99)
Glucose-Capillary: 163 mg/dL — ABNORMAL HIGH (ref 70–99)
Glucose-Capillary: 215 mg/dL — ABNORMAL HIGH (ref 70–99)
Glucose-Capillary: 91 mg/dL (ref 70–99)

## 2023-04-12 LAB — RESP PANEL BY RT-PCR (RSV, FLU A&B, COVID)  RVPGX2
Influenza A by PCR: NEGATIVE
Influenza B by PCR: NEGATIVE
Resp Syncytial Virus by PCR: NEGATIVE
SARS Coronavirus 2 by RT PCR: NEGATIVE

## 2023-04-12 MED ORDER — LACOSAMIDE 10 MG/ML PO SOLN
150.0000 mg | Freq: Two times a day (BID) | ORAL | Status: DC
  Administered 2023-04-12: 150 mg
  Filled 2023-04-12: qty 15

## 2023-04-12 MED ORDER — LACOSAMIDE 10 MG/ML PO SOLN: 150.0000 mg | Freq: Two times a day (BID) | ORAL | 0 refills | Status: AC

## 2023-04-12 MED ORDER — LEVETIRACETAM 100 MG/ML PO SOLN
1500.0000 mg | Freq: Two times a day (BID) | ORAL | Status: DC
  Administered 2023-04-12: 1500 mg
  Filled 2023-04-12 (×2): qty 15

## 2023-04-12 MED ORDER — LEVETIRACETAM 100 MG/ML PO SOLN: 1500.0000 mg | Freq: Two times a day (BID) | ORAL | Status: DC

## 2023-04-12 MED ORDER — METOPROLOL TARTRATE 50 MG PO TABS: 50.0000 mg | ORAL_TABLET | Freq: Two times a day (BID) | ORAL | Status: AC

## 2023-04-12 MED ORDER — GLYCOPYRROLATE 1 MG PO TABS
1.0000 mg | ORAL_TABLET | Freq: Three times a day (TID) | ORAL | Status: DC | PRN
Start: 2023-04-12 — End: 2023-04-12

## 2023-04-12 MED ORDER — GUAIFENESIN 100 MG/5ML PO LIQD: 5.0000 mL | ORAL | Status: DC | PRN

## 2023-04-12 NOTE — Care Management Important Message (Signed)
Important Message  Patient Details  Name: Kaitlyn Ruiz MRN: [id number] Date of Birth: [birth year]   Important Message Given:  Yes - Medicare IM   Patient discharged copy of IM mailed to patient home address.    Kash Mothershead 04/12/2023, 2:21 PM

## 2023-04-12 NOTE — TOC Progression Note (Signed)
Transition of Care [facility]) - Progression Note    Patient Details  Name: RENNAE WALDNER MRN: [id number] Date of Birth: 10/28/1955  Transition of Care [facility]) CM/SW Contact  Yaileen Hofferber A [place], [place] Phone Number: 04/12/2023, 12:37 PM  Clinical Narrative:     Patient will DC to: [facility]  Anticipated DC date: 04/12/23  Family notified: [name],pt's sister   Transport by: Sharin Mons      Per MD patient ready for DC to  [facility] RN, patient, patient's family, and facility notified of DC. Discharge Summary and FL2 sent to facility. RN to call report prior to discharge [phone]). DC packet on chart. Ambulance transport requested for patient.     CSW will sign off for now as social work intervention is no longer needed. Please consult [place] again if new needs arise.   Expected Discharge Plan: Long Term Nursing Home Barriers to Discharge: Barriers Resolved  Expected Discharge Plan and Services       Living arrangements for the past 2 months: Skilled Nursing Facility Expected Discharge Date: 04/12/23                                     Social Determinants of Health (SDOH) Interventions SDOH Screenings   Food Insecurity: No Food Insecurity (04/06/2023)  Housing: Patient Unable To Answer (04/06/2023)  Transportation Needs: No Transportation Needs (04/06/2023)  Utilities: Not At Risk (04/06/2023)  Tobacco Use: Medium Risk (04/06/2023)    Readmission Risk Interventions    08/14/2021   10:11 AM  Readmission Risk Prevention Plan  Transportation Screening Complete  PCP or Specialist Appt within 3-5 Days Complete  HRI or Home Care Consult Complete  Social Work Consult for Recovery Care Planning/Counseling Complete  Medication Review Oceanographer) Complete

## 2023-04-12 NOTE — Progress Notes (Signed)
Report called to [facility] at [facility].

## 2023-04-12 NOTE — Plan of Care (Signed)
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
## 2023-04-12 NOTE — Progress Notes (Addendum)
Brief TRH note:   Notified by RN of increased congestion, suspected aspiration. Had desat into 80s but sponataneously recovered. On exam, tachypneic in 20's other VS wnl, 96% on RA. She has congested cough which is nonproductive, referred upper airway sounds from secretions as well as rales in the lower fields. Possible aspiration of tube feeds. Stopped tube feeds, will check CBG in few hr since just received correctional to ensure not dropping, elevate HOB > 30 deg, chest XR, and nebs. Hold on abx or other changes for now.   Update: No large aspiration on CXR, respiratory status appears stable. Advised to resume feeds at 20 cc/hr and advance 10 cc per hr until goal. With her respiratory change and interstitial pattern, will also send COVID/Flu/RSV testing   Dolly Rias, MD  [facility]

## 2023-04-14 ENCOUNTER — Observation Stay
Admission: EM | Admit: 2023-04-14 | Discharge: 2023-04-15 | Disposition: A | Discharge status: Home / Self Care | Payer: Medicare Other | Attending: Emergency Medicine | Admitting: Emergency Medicine

## 2023-04-14 ENCOUNTER — Other Ambulatory Visit: Payer: Self-pay

## 2023-04-14 DIAGNOSIS — E1140 Type 2 diabetes mellitus with diabetic neuropathy, unspecified: Secondary | ICD-10-CM | POA: Diagnosis not present

## 2023-04-14 DIAGNOSIS — I5032 Chronic diastolic (congestive) heart failure: Secondary | ICD-10-CM | POA: Diagnosis not present

## 2023-04-14 DIAGNOSIS — Z87891 Personal history of nicotine dependence: Secondary | ICD-10-CM | POA: Insufficient documentation

## 2023-04-14 DIAGNOSIS — G40909 Epilepsy, unspecified, not intractable, without status epilepticus: Secondary | ICD-10-CM | POA: Diagnosis not present

## 2023-04-14 DIAGNOSIS — J449 Chronic obstructive pulmonary disease, unspecified: Secondary | ICD-10-CM | POA: Diagnosis not present

## 2023-04-14 DIAGNOSIS — F32A Depression, unspecified: Secondary | ICD-10-CM | POA: Diagnosis not present

## 2023-04-14 DIAGNOSIS — Z7982 Long term (current) use of aspirin: Secondary | ICD-10-CM | POA: Diagnosis not present

## 2023-04-14 DIAGNOSIS — Z794 Long term (current) use of insulin: Secondary | ICD-10-CM | POA: Insufficient documentation

## 2023-04-14 DIAGNOSIS — E785 Hyperlipidemia, unspecified: Secondary | ICD-10-CM | POA: Diagnosis not present

## 2023-04-14 DIAGNOSIS — Z66 Do not resuscitate: Secondary | ICD-10-CM | POA: Diagnosis not present

## 2023-04-14 DIAGNOSIS — Z79899 Other long term (current) drug therapy: Secondary | ICD-10-CM | POA: Insufficient documentation

## 2023-04-14 DIAGNOSIS — K9423 Gastrostomy malfunction: Principal | ICD-10-CM | POA: Insufficient documentation

## 2023-04-14 DIAGNOSIS — T85528A Displacement of other gastrointestinal prosthetic devices, implants and grafts, initial encounter: Principal | ICD-10-CM

## 2023-04-14 DIAGNOSIS — I69920 Aphasia following unspecified cerebrovascular disease: Secondary | ICD-10-CM | POA: Diagnosis not present

## 2023-04-14 DIAGNOSIS — E1142 Type 2 diabetes mellitus with diabetic polyneuropathy: Secondary | ICD-10-CM | POA: Diagnosis present

## 2023-04-14 DIAGNOSIS — I110 Hypertensive heart disease with heart failure: Secondary | ICD-10-CM | POA: Diagnosis not present
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
## 2023-04-14 NOTE — ED Triage Notes (Signed)
Pt BIB EMS from [facility] for gtube replacement--pt pulled out own tube.

## 2023-04-15 ENCOUNTER — Observation Stay: Payer: Medicare Other | Admitting: Radiology

## 2023-04-15 ENCOUNTER — Encounter: Payer: Self-pay | Admitting: Emergency Medicine

## 2023-04-15 DIAGNOSIS — F32A Depression, unspecified: Secondary | ICD-10-CM

## 2023-04-15 DIAGNOSIS — T85528A Displacement of other gastrointestinal prosthetic devices, implants and grafts, initial encounter: Secondary | ICD-10-CM | POA: Diagnosis not present

## 2023-04-15 DIAGNOSIS — J449 Chronic obstructive pulmonary disease, unspecified: Secondary | ICD-10-CM | POA: Diagnosis not present

## 2023-04-15 DIAGNOSIS — K9423 Gastrostomy malfunction: Secondary | ICD-10-CM | POA: Diagnosis not present

## 2023-04-15 DIAGNOSIS — E1142 Type 2 diabetes mellitus with diabetic polyneuropathy: Secondary | ICD-10-CM | POA: Diagnosis not present

## 2023-04-15 DIAGNOSIS — E785 Hyperlipidemia, unspecified: Secondary | ICD-10-CM

## 2023-04-15 HISTORY — PX: IR REPLC GASTRO/COLONIC TUBE PERCUT W/FLUORO: IMG2333

## 2023-04-15 LAB — CBC WITH DIFFERENTIAL/PLATELET
Abs Immature Granulocytes: 0.02 10*3/uL (ref 0.00–0.07)
Basophils Absolute: 0 10*3/uL (ref 0.0–0.1)
Basophils Relative: 1 %
Eosinophils Absolute: 0.3 10*3/uL (ref 0.0–0.5)
Eosinophils Relative: 3 %
HCT: 44.1 % (ref 36.0–46.0)
Hemoglobin: 14.7 g/dL (ref 12.0–15.0)
Immature Granulocytes: 0 %
Lymphocytes Relative: 33 %
Lymphs Abs: 2.9 10*3/uL (ref 0.7–4.0)
MCH: 31 pg (ref 26.0–34.0)
MCHC: 33.3 g/dL (ref 30.0–36.0)
MCV: 93 fL (ref 80.0–100.0)
Monocytes Absolute: 0.6 10*3/uL (ref 0.1–1.0)
Monocytes Relative: 7 %
Neutro Abs: 4.8 10*3/uL (ref 1.7–7.7)
Neutrophils Relative %: 56 %
Platelets: 211 10*3/uL (ref 150–400)
RBC: 4.74 MIL/uL (ref 3.87–5.11)
RDW: 13.2 % (ref 11.5–15.5)
WBC: 8.6 10*3/uL (ref 4.0–10.5)
nRBC: 0 % (ref 0.0–0.2)

## 2023-04-15 LAB — BASIC METABOLIC PANEL
Anion gap: 7 (ref 5–15)
BUN: 16 mg/dL (ref 8–23)
CO2: 27 mmol/L (ref 22–32)
Calcium: 8.8 mg/dL — ABNORMAL LOW (ref 8.9–10.3)
Chloride: 100 mmol/L (ref 98–111)
Creatinine, Ser: 0.61 mg/dL (ref 0.44–1.00)
GFR, Estimated: >60 mL/min (ref >60)
Glucose, Bld: 135 mg/dL — ABNORMAL HIGH (ref 70–99)
Potassium: 4 mmol/L (ref 3.5–5.1)
Sodium: 134 mmol/L — ABNORMAL LOW (ref 135–145)

## 2023-04-15 LAB — CBG MONITORING, ED
Glucose-Capillary: 98 mg/dL (ref 70–99)
Glucose-Capillary: 93 mg/dL (ref 70–99)

## 2023-04-15 MED ORDER — IPRATROPIUM-ALBUTEROL 0.5-2.5 (3) MG/3ML IN SOLN
3.0000 mL | Freq: Four times a day (QID) | RESPIRATORY_TRACT | Status: DC
  Administered 2023-04-15 (×2): 3 mL via RESPIRATORY_TRACT
  Filled 2023-04-15 (×2): qty 3

## 2023-04-15 MED ORDER — METOPROLOL TARTRATE 50 MG PO TABS
50.0000 mg | ORAL_TABLET | Freq: Two times a day (BID) | ORAL | Status: DC
Start: 2023-04-15 — End: 2023-04-15

## 2023-04-15 MED ORDER — TRAZODONE HCL 50 MG PO TABS: 25.0000 mg | ORAL_TABLET | Freq: Every evening | ORAL | Status: DC | PRN

## 2023-04-15 MED ORDER — LIDOCAINE HCL 1 % IJ SOLN
INTRAMUSCULAR | Status: AC
  Filled 2023-04-15: qty 20

## 2023-04-15 MED ORDER — LEVETIRACETAM 100 MG/ML PO SOLN: 1500.0000 mg | Freq: Two times a day (BID) | ORAL | Status: DC

## 2023-04-15 MED ORDER — STERILE WATER FOR INJECTION IJ SOLN
INTRAMUSCULAR | Status: AC
  Filled 2023-04-15: qty 10

## 2023-04-15 MED ORDER — HYDRALAZINE HCL 20 MG/ML IJ SOLN: 10.0000 mg | INTRAMUSCULAR | Status: DC | PRN

## 2023-04-15 MED ORDER — GABAPENTIN 250 MG/5ML PO SOLN: 300.0000 mg | Freq: Three times a day (TID) | ORAL | Status: DC

## 2023-04-15 MED ORDER — HYDROCOD POLI-CHLORPHE POLI ER 10-8 MG/5ML PO SUER: 5.0000 mL | Freq: Two times a day (BID) | ORAL | Status: DC | PRN

## 2023-04-15 MED ORDER — ONDANSETRON HCL 4 MG/2ML IJ SOLN: 4.0000 mg | Freq: Four times a day (QID) | INTRAMUSCULAR | Status: DC | PRN

## 2023-04-15 MED ORDER — IOHEXOL 300 MG/ML  SOLN
5.0000 mL | Freq: Once | INTRAMUSCULAR | Status: AC | PRN
  Administered 2023-04-15: 5 mL

## 2023-04-15 MED ORDER — LACOSAMIDE 10 MG/ML PO SOLN: 150.0000 mg | Freq: Two times a day (BID) | ORAL | Status: DC

## 2023-04-15 MED ORDER — RISAQUAD PO CAPS: 1.0000 | ORAL_CAPSULE | Freq: Every day | ORAL | Status: DC

## 2023-04-15 MED ORDER — POLYETHYLENE GLYCOL 3350 17 G PO PACK: 17.0000 g | PACK | Freq: Every day | ORAL | Status: DC | PRN

## 2023-04-15 MED ORDER — SCOPOLAMINE 1 MG/3DAYS TD PT72: 1.0000 | MEDICATED_PATCH | TRANSDERMAL | Status: DC

## 2023-04-15 MED ORDER — ONDANSETRON HCL 4 MG PO TABS: 4.0000 mg | ORAL_TABLET | Freq: Four times a day (QID) | ORAL | Status: DC | PRN

## 2023-04-15 MED ORDER — SENNOSIDES 8.8 MG/5ML PO SYRP: 5.0000 mL | ORAL_SOLUTION | Freq: Every day | ORAL | Status: DC

## 2023-04-15 MED ORDER — IPRATROPIUM-ALBUTEROL 0.5-2.5 (3) MG/3ML IN SOLN: 3.0000 mL | RESPIRATORY_TRACT | Status: DC | PRN

## 2023-04-15 MED ORDER — ACETAMINOPHEN 325 MG RE SUPP: 650.0000 mg | Freq: Four times a day (QID) | RECTAL | Status: DC | PRN

## 2023-04-15 MED ORDER — POTASSIUM CHLORIDE IN NACL 20-0.9 MEQ/L-% IV SOLN
INTRAVENOUS | Status: DC
  Filled 2023-04-15: qty 1000

## 2023-04-15 MED ORDER — LOPERAMIDE HCL 1 MG/7.5ML PO SOLN: 2.0000 mg | Freq: Four times a day (QID) | ORAL | Status: DC | PRN

## 2023-04-15 MED ORDER — GUAIFENESIN ER 600 MG PO TB12: 600.0000 mg | ORAL_TABLET | Freq: Two times a day (BID) | ORAL | Status: DC

## 2023-04-15 MED ORDER — ACETAMINOPHEN 325 MG PO TABS: 650.0000 mg | ORAL_TABLET | Freq: Four times a day (QID) | ORAL | Status: DC | PRN

## 2023-04-15 MED ORDER — INSULIN ASPART 100 UNIT/ML IJ SOLN: 0.0000 [IU] | INTRAMUSCULAR | Status: DC

## 2023-04-15 MED ORDER — PRAVASTATIN SODIUM 20 MG PO TABS: 20.0000 mg | ORAL_TABLET | Freq: Every day | ORAL | Status: DC

## 2023-04-15 MED ORDER — CITALOPRAM HYDROBROMIDE 20 MG PO TABS: 10.0000 mg | ORAL_TABLET | Freq: Every day | ORAL | Status: DC

## 2023-04-15 MED ORDER — MAGNESIUM HYDROXIDE 400 MG/5ML PO SUSP: 30.0000 mL | Freq: Every day | ORAL | Status: DC | PRN

## 2023-04-15 MED ORDER — ENOXAPARIN SODIUM 40 MG/0.4ML IJ SOSY
40.0000 mg | PREFILLED_SYRINGE | INTRAMUSCULAR | Status: DC
  Administered 2023-04-15: 40 mg via SUBCUTANEOUS
  Filled 2023-04-15: qty 0.4

## 2023-04-15 MED ORDER — FLUDROCORTISONE ACETATE 0.1 MG PO TABS
0.1000 mg | ORAL_TABLET | Freq: Every day | ORAL | Status: DC
Start: 2023-04-15 — End: 2023-04-15

## 2023-04-15 NOTE — Assessment & Plan Note (Signed)
-  We will continue Celexa. 

## 2023-04-15 NOTE — Discharge Summary (Addendum)
Physician Discharge Summary  Kaitlyn Ruiz MVH:[id number] DOB: June 21, 1955 DOA: 04/14/2023  PCP: Patient, No Pcp Per  Admit date: 04/14/2023 Discharge date: 04/15/2023  Admitted From: SNF Disposition: SNF  Recommendations for Outpatient Follow-up:  Follow up with PCP in 1-2 weeks Please obtain BMP/CBC in one week your next doctors visit.     Discharge Condition: Stable CODE STATUS: DNR Diet recommendation: Resume prior diet    Brief Narrative:  67 year old with DM 2, HTN, depression, CVA who has chronic G-tube in place admitted to the hospital for dislodged tube.  IR consulted.  Urgent tube was replaced on 11/15 morning.  Medically stable for discharge back to facility.   Assessment & Plan:  Principal Problem:   Dislodged gastrostomy tube Active Problems:   Chronic obstructive pulmonary disease (COPD) (HCC)   Type 2 diabetes mellitus with peripheral neuropathy (HCC)   Depression   Dyslipidemia   Dislodged gastrostomy tube History of CVA, bedbound, dysphagia NG tube placed in gastrostomy stoma.  IR replaced it on 11/15   Chronic obstructive pulmonary disease (COPD) (HCC) Bronchodilators   Type 2 diabetes mellitus with peripheral neuropathy (HCC) Sliding scale and Accu-Chek. Gabapentin   Dyslipidemia Pravachol   Depression Select  Epilepsy/right frontal CVA - Recently diagnosed and discharged on 11/12, on Keppra and Vimpat.   DVT prophylaxis: enoxaparin (LOVENOX) injection 40 mg Start: 04/15/23 1000 Code Status: DNR Family Communication:   G-tube replaced.  Will return back to facility  Subjective:  Seen after her G-tube replacement.  Doing well.  No complaints Has some aphasia but able to have very basic conversation.  Examination:  General exam: Appears calm and comfortable  Respiratory system: Clear to auscultation. Respiratory effort normal. Cardiovascular system: S1 & S2 heard, RRR. No JVD, murmurs, rubs, gallops or clicks. No pedal  edema. Gastrointestinal system: Abdomen is nondistended, soft and nontender. No organomegaly or masses felt. Normal bowel sounds heard. Central nervous system: Alert and oriented.  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 Skin: No rashes, lesions or ulcers G-tube in place     Discharge Diagnoses:  Principal Problem:   Dislodged gastrostomy tube Active Problems:   Chronic obstructive pulmonary disease (COPD) (HCC)   Type 2 diabetes mellitus with peripheral neuropathy (HCC)   Depression   Dyslipidemia       Discharge Exam: Vitals:   04/15/23 0602 04/15/23 1101  BP:  (!) 141/77  Pulse:  73  Resp:  16  Temp: 97.7 F (36.5 C)   SpO2:  96%   Vitals:   04/15/23 0200 04/15/23 0545 04/15/23 0602 04/15/23 1101  BP: (!) 128/59 (!) 150/75  (!) 141/77  Pulse: 63 67  73  Resp: 17 16  16   Temp:   97.7 F (36.5 C)   TempSrc:   Axillary   SpO2: 97% 97%  96%  Weight:      Height:          Discharge Instructions   Allergies as of 04/15/2023   No Known Allergies      Medication List     STOP taking these medications    simvastatin 5 MG tablet Commonly known as: ZOCOR       TAKE these medications    Acetaminophen 325 MG/10.15ML Soln Give 325 mg by tube every 8 (eight) hours.   ACIDOPHILUS PO Take 1 tablet by mouth in the morning, at noon, and at bedtime.   albuterol 108 (90 Base) MCG/ACT inhaler Commonly known as: VENTOLIN HFA Inhale 2  puffs into the lungs every 4 (four) hours as needed for wheezing or shortness of breath.   ammonium lactate 12 % lotion Commonly known as: LAC-HYDRIN Apply 1 Application topically daily. Apply small amount to bilateral feet   aspirin 81 MG chewable tablet Place 81 mg into feeding tube daily.   citalopram 10 MG tablet Commonly known as: CELEXA Place 10 mg into feeding tube daily.   Dorzolamide HCl-Timolol Mal PF 2-0.5 % Soln Apply 1 drop to eye every 12 (twelve) hours.   feeding  supplement (PROSource TF) liquid Place 45 mLs into feeding tube daily. What changed:  how much to take when to take this   fludrocortisone 0.1 MG tablet Commonly known as: FLORINEF Place 0.1 mg into feeding tube daily.   gabapentin 250 MG/5ML solution Commonly known as: NEURONTIN Place 6 mLs (300 mg total) into feeding tube every 8 (eight) hours.   guaiFENesin 100 MG/5ML liquid Commonly known as: ROBITUSSIN Take 5 mLs by mouth every 4 (four) hours as needed for cough or to loosen phlegm.   ipratropium-albuterol 0.5-2.5 (3) MG/3ML Soln Commonly known as: DUONEB Take 3 mLs by nebulization 2 (two) times daily as needed.   lacosamide 10 MG/ML oral solution Commonly known as: VIMPAT Take 15 mLs (150 mg total) by mouth 2 (two) times daily.   levETIRAcetam 100 MG/ML solution Commonly known as: KEPPRA Place 15 mLs into feeding tube 2 (two) times daily.   liver oil-zinc oxide 40 % ointment Commonly known as: DESITIN Apply topically as needed for irritation. What changed:  how much to take when to take this additional instructions   loperamide HCl 2 MG/15ML solution Commonly known as: IMODIUM Place 15 mLs (2 mg total) into feeding tube 4 (four) times daily as needed for diarrhea or loose stools.   metoprolol tartrate 50 MG tablet Commonly known as: LOPRESSOR Place 1 tablet (50 mg total) into feeding tube 2 (two) times daily.   mouth rinse Liqd solution 15 mLs by Mouth Rinse route 2 times daily at 12 noon and 4 pm.   NovoLOG FlexPen 100 UNIT/ML FlexPen Generic drug: insulin aspart Inject 2-14 Units into the skin in the morning and at bedtime. Per sliding scale, if blood sugar is: 201 - 250 = 2 units 251 - 300 = 4 units 301 - 350 = 6 units 351 - 400 = 8 units 401- 450 = 10 units 451- 500 = 12 units 500 or above = 14 units If blood sugar is >400 or <80 call PEC Triage.   nystatin powder Apply 1 Application topically 3 (three) times daily.   polyethylene glycol 17 g  packet Commonly known as: MIRALAX / GLYCOLAX Place 17 g into feeding tube daily as needed for moderate constipation.   pravastatin 20 MG tablet Commonly known as: PRAVACHOL Place 20 mg into feeding tube at bedtime.   Pro-Stat AWC Liqd Take 30 mLs by mouth in the morning and at bedtime.   scopolamine 1 MG/3DAYS Commonly known as: TRANSDERM-SCOP Place 1 patch onto the skin every 3 (three) days. (Apply behind ear)   sennosides 8.8 MG/5ML syrup Commonly known as: SENOKOT Place 5 mLs into feeding tube daily.   Valproic Acid 500 MG/10ML Soln 2.5 mLs by Feeding Tube route in the morning and at bedtime.   White Petroleum Jelly Gel Apply 1 Application topically 3 (three) times daily. (Apply to lips)        No Known Allergies  You were cared for by a hospitalist during your hospital  stay. If you have any questions about your discharge medications or the care you received while you were in the hospital after you are discharged, you can call the unit and asked to speak with the hospitalist on call if the hospitalist that took care of you is not available. Once you are discharged, your primary care physician will handle any further medical issues. Please note that no refills for any discharge medications will be authorized once you are discharged, as it is imperative that you return to your primary care physician (or establish a relationship with a primary care physician if you do not have one) for your aftercare needs so that they can reassess your need for medications and monitor your lab values.  You were cared for by a hospitalist during your hospital stay. If you have any questions about your discharge medications or the care you received while you were in the hospital after you are discharged, you can call the unit and asked to speak with the hospitalist on call if the hospitalist that took care of you is not available. Once you are discharged, your primary care physician will handle any  further medical issues. Please note that NO REFILLS for any discharge medications will be authorized once you are discharged, as it is imperative that you return to your primary care physician (or establish a relationship with a primary care physician if you do not have one) for your aftercare needs so that they can reassess your need for medications and monitor your lab values.  Please request your Prim.MD to go over all Hospital Tests and Procedure/Radiological results at the follow up, please get all Hospital records sent to your Prim MD by signing hospital release before you go home.  Get CBC, CMP, 2 view Chest X ray checked  by Primary MD during your next visit or SNF MD in 5-7 days ( we routinely change or add medications that can affect your baseline labs and fluid status, therefore we recommend that you get the mentioned basic workup next visit with your PCP, your PCP may decide not to get them or add new tests based on their clinical decision)  On your next visit with your primary care physician please Get Medicines reviewed and adjusted.  If you experience worsening of your admission symptoms, develop shortness of breath, life threatening emergency, suicidal or homicidal thoughts you must seek medical attention immediately by calling 911 or calling your MD immediately  if symptoms less severe.  You Must read complete instructions/literature along with all the possible adverse reactions/side effects for all the Medicines you take and that have been prescribed to you. Take any new Medicines after you have completely understood and accpet all the possible adverse reactions/side effects.   Do not drive, operate heavy machinery, perform activities at heights, swimming or participation in water activities or provide baby sitting services if your were admitted for syncope or siezures until you have seen by Primary MD or a Neurologist and advised to do so again.  Do not drive when taking Pain  medications.   Procedures/Studies: IR REPLACE GASTRO/JEJUNO TUBE PERC W/FLUORO  Result Date: 04/15/2023 INDICATION: History of chronic indwelling percutaneous gastrostomy tube. The tube has become dislodged and could not be replaced at the bedside. EXAM: PERCUTANEOUS GASTROSTOMY TUBE REPLACEMENT REQUIRING FLUOROSCOPY AND CONTRAST INJECTION MEDICATIONS: None ANESTHESIA/SEDATION: None CONTRAST:  5 mL Omnipaque 300-administered into the gastric lumen. FLUOROSCOPY: Radiation Exposure Index (as provided by the fluoroscopic device): 1.0 mGy mGy Kerma COMPLICATIONS: None immediate.  PROCEDURE: Informed written consent was obtained from the patient's sister after a thorough discussion of the procedural risks, benefits and alternatives. All questions were addressed. Maximal Sterile Barrier Technique was utilized including caps, mask, sterile gowns, sterile gloves, sterile drape, hand hygiene and skin antiseptic. A timeout was performed prior to the initiation of the procedure. A guidewire was advanced into the stomach through the pre-existing gastrostomy tube tract. The tract was dilated over the wire with an 18 [place] dilator. A new 18 French balloon retention gastrostomy tube was then advanced over the wire and into the stomach. The guidewire was removed. The retention balloon was inflated with 10 mL of sterile water. The gastrostomy tube was injected with contrast material and a fluoroscopic image saved to confirm position. FINDINGS: Tract dilatation was required in order to advance a new 59 French gastrostomy tube through the percutaneous tract. After replacement, the tube tip lies in the body of the stomach and the gastrostomy tube is safety use. IMPRESSION: Replacement of dislodged percutaneous gastrostomy tube with new 18 French balloon retention gastrostomy tube under fluoroscopy. The tip of the catheter is in the body of the stomach. Electronically Signed   By: Irish Lack M.D.   On: 04/15/2023 10:59    DG CHEST PORT 1 VIEW  Result Date: 04/12/2023 CLINICAL DATA:  67 year old female with history of shortness of breath. EXAM: PORTABLE CHEST 1 VIEW COMPARISON:  Chest x-ray 12/18/2022. FINDINGS: Lung volumes are low. Diffuse interstitial prominence and peribronchial cuffing. Patchy linear opacities in the lung bases, similar to the prior study, most compatible with areas of chronic scarring. No acute consolidative airspace disease. No pleural effusions. No pneumothorax. No evidence of pulmonary edema. Heart size is normal. The patient is rotated to the left on today's exam, resulting in distortion of the mediastinal contours and reduced diagnostic sensitivity and specificity for mediastinal pathology. Atherosclerotic calcifications are noted in the thoracic aorta. IMPRESSION: 1. No radiographic evidence of acute cardiopulmonary disease. 2. Areas of probable scarring in the lung bases bilaterally, similar to the prior study. 3. Mild diffuse interstitial prominence and peribronchial cuffing, suggesting a background of chronic bronchitis. 4. Aortic atherosclerosis. Electronically Signed   By: Trudie Reed M.D.   On: 04/12/2023 06:28   CT HEAD WO CONTRAST ( )  Result Date: 04/11/2023 CLINICAL DATA:  Follow-up intracranial hemorrhage EXAM: CT HEAD WITHOUT CONTRAST TECHNIQUE: Contiguous axial images were obtained from the base of the skull through the vertex without intravenous contrast. RADIATION DOSE REDUCTION: This exam was performed according to the departmental dose-optimization program which includes automated exposure control, adjustment of the mA and/or kV according to patient size and/or use of iterative reconstruction technique. COMPARISON:  04/06/2023 FINDINGS: Brain: Redemonstrated intraparenchymal hematoma in the right frontoparietal region, which measures up to 1.6 x 2.0 x 2.4 cm (AP x TR x CC) (series 5, image 28 and series 4, image 18), previously 1.4 x 1.9 x 2.1 cm. This is overall stable  in size and decreased slightly in density compared to the prior exam, consistent with expected aging of blood products. Degree of surrounding hypodensity, most likely edema, appears unchanged. No significant mass effect or midline shift. No acute infarct, mass, hydrocephalus, or extra-axial collection. Periventricular white matter changes, likely the sequela of chronic small vessel ischemic disease. Remote lacunar infarcts in the basal ganglia thalami, and pons. Vascular: No hyperdense vessel. Skull: Negative for fracture or focal lesion. Sinuses/Orbits: Clear paranasal sinuses. No acute finding in the orbits. Other: The mastoids are well aerated. IMPRESSION: Redemonstrated  intraparenchymal hematoma in the right frontoparietal region, which is overall stable in size and slightly decreased in density, consistent with expected aging of blood products. Degree of surrounding edema appears unchanged. No significant mass effect or midline shift. Electronically Signed   By: Wiliam Ke M.D.   On: 04/11/2023 00:45   Overnight EEG with video  Result Date: 04/08/2023 Charlsie Quest, MD     04/09/2023  7:46 AM Patient Name: Kaitlyn Ruiz MRN: [id number] Epilepsy Attending: Charlsie Quest Referring Physician/Provider: Charlsie Quest, MD Duration: 04/07/2023 1414 to 04/08/2023 1414  Patient history: 67 yo F with sudden onset of seizure-like activity on the left side with left-sided weakness and unresponsiveness. EEG to evaluate for seizure  Level of alertness: Awake, asleep  AEDs during EEG study: LEV, LCM  Technical aspects: This EEG study was done with scalp electrodes positioned according to the 10-20 International system of electrode placement. Electrical activity was reviewed with band pass filter of 1-70Hz , sensitivity of 7 uV/mm, display speed of 9mm/sec with a 60Hz  notched filter applied as appropriate. EEG data were recorded continuously and digitally stored.  Video monitoring was available and reviewed as  appropriate.  Description: No clear posterior dominant rhythm was seen.  Sleep was characterized by sleep spindles (12 to 14 Hz), maximal frontocentral region. EEG showed continuous generalized and lateralized right hemisphere 3 to 6 Hz theta-delta slowing. Sharp waves were noted in right frontotemporal region. Hyperventilation and photic stimulation were not performed.    ABNORMALITY - Continuous slow, generalized and lateralized right hemisphere - Sharp waves, right frontotemporal region  IMPRESSION: This study showed evidence of epileptogenicity arising from right frontotemporal region. Additionally there is cortical dysfunction arising from right hemisphere likely secondary to underlying ICH. Lastly there is moderate diffuse encephalopathy. No seizures were seen throughout the recording.  Charlsie Quest   EEG adult  Result Date: 04/07/2023 Charlsie Quest, MD     04/07/2023  8:31 AM Patient Name: Kaitlyn Ruiz MRN: [id number] Epilepsy Attending: Charlsie Quest Referring Physician/Provider: Jefferson Fuel, MD Date: 04/07/2023 Duration: 23.18 mins Patient history: 67 yo F with sudden onset of seizure-like activity on the left side with left-sided weakness and unresponsiveness. EEG to evaluate for seizure Level of alertness: Awake AEDs during EEG study: LEV, LCM Technical aspects: This EEG study was done with scalp electrodes positioned according to the 10-20 International system of electrode placement. Electrical activity was reviewed with band pass filter of 1-70Hz , sensitivity of 7 uV/mm, display speed of 35mm/sec with a 60Hz  notched filter applied as appropriate. EEG data were recorded continuously and digitally stored.  Video monitoring was available and reviewed as appropriate. Description:  EEG showed continuous generalized and lateralized right hemisphere 3 to 6 Hz theta-delta slowing.  Sharp waves were noted in right frontotemporal region.. Hyperventilation and photic stimulation were not  performed.    ABNORMALITY - Continuous slow, generalized and lateralized right hemisphere -Sharp waves, right frontotemporal region  IMPRESSION: This study showed evidence of epileptogenicity arising from right frontotemporal region.  Additionally there is cortical dysfunction arising from right hemisphere likely secondary to underlying ICH.  Lastly there is moderate diffuse encephalopathy. No seizures were seen throughout the recording.  Charlsie Quest   CT VENOGRAM HEAD  Result Date: 04/06/2023 CLINICAL DATA:  Dural venous sinus thrombosis suspected. Right frontoparietal hematoma. EXAM: CT VENOGRAM HEAD TECHNIQUE: Venographic phase images of the brain were obtained following the administration of intravenous contrast. Multiplanar reformats and maximum intensity projections were generated.  RADIATION DOSE REDUCTION: This exam was performed according to the departmental dose-optimization program which includes automated exposure control, adjustment of the mA and/or kV according to patient size and/or use of iterative reconstruction technique. CONTRAST:  75mL OMNIPAQUE IOHEXOL 350 MG/ML SOLN COMPARISON:  Head MRI 04/05/2023 and CT 04/04/2023 FINDINGS: NONCONTRAST CT HEAD: A 2.5 cm parenchymal hemorrhage in the high right frontoparietal region is unchanged in size. Mild surrounding edema is unchanged. There is no significant mass effect. No new intracranial hemorrhage, separate acute infarct, or extra-axial fluid collection is identified. Age advanced cerebral atrophy and extensive chronic small vessel ischemia in the cerebral white matter are again noted with chronic lacunar infarcts in the deep gray nuclei and pons. Calcified atherosclerosis is noted at the skull base. No fracture or suspicious osseous lesion is identified. The included paranasal sinuses and mastoid air cells are clear. The included portions of the orbits are unremarkable. CT VENOGRAM: The superior sagittal sinus, internal cerebral veins, vein  of Galen, straight sinus, transverse sinuses, sigmoid sinuses, and jugular bulbs are patent without evidence of thrombus or significant stenosis. IMPRESSION: 1. Unchanged right frontoparietal parenchymal hemorrhage. 2. No evidence of dural venous sinus thrombosis. 3. Extensive chronic small vessel ischemia. Electronically Signed   By: Sebastian Ache M.D.   On: 04/06/2023 16:47   MR BRAIN WO CONTRAST  Result Date: 04/05/2023 CLINICAL DATA:  Stroke follow-up. EXAM: MRI HEAD WITHOUT CONTRAST TECHNIQUE: Multiplanar, multiecho pulse sequences of the brain and surrounding structures were obtained without intravenous contrast. COMPARISON:  Head CT from yesterday FINDINGS: Brain: No detected infarct or mass underlying the patient's right parietal hematoma. The hematoma has central T1 hyperintensity from methemoglobin. There have been numerous chronic microhemorrhages primarily in the deep brain. Confluent ischemic gliosis in the cerebral white matter with chronic lacunar infarcts in the deep gray nuclei, brainstem, and deep white matter. Premature brain atrophy. Vascular: Grossly preserved flow voids. Skull and upper cervical spine: Normal marrow signal Sinuses/Orbits: Normal Other: Intermittently severe motion artifact, multiple sequences are nondiagnostic. IMPRESSION: 1. Intermittently severe motion degradation. 2. No evidence of infarct or mass underlying the patient's right parietal hematoma. 3. Severe chronic small vessel disease with numerous remote micro hemorrhages and lacunar infarcts. Electronically Signed   By: Tiburcio Pea M.D.   On: 04/05/2023 19:11   DG ABDOMEN PEG TUBE LOCATION  Result Date: 04/05/2023 CLINICAL DATA:  Confirm gastrostomy tube location. EXAM: ABDOMEN - 1 VIEW COMPARISON:  Abdominal x-ray dated February 14, 2023. FINDINGS: 30 mL of Gastrografin injected through the gastrostomy tube demonstrates normal opacification of the stomach and proximal duodenum. No contrast extravasation.  Normal bowel gas pattern. IMPRESSION: 1. Gastrostomy tube within the stomach. Electronically Signed   By: Obie Dredge M.D.   On: 04/05/2023 13:46   ECHOCARDIOGRAM COMPLETE  Result Date: 04/05/2023    ECHOCARDIOGRAM REPORT   Patient Name:   Kaitlyn Ruiz Date of Exam: 04/05/2023 Medical Rec #:  [id number]      Height:       65.0 in Accession #:    [id number]     Weight:       173.3 lb Date of Birth:  03-04-1956      BSA:          1.861 m Patient Age:    67 years       BP:           101/55 mmHg Patient Gender: F              HR:  95 bpm. Exam Location:  [facility] Procedure: 2D Echo, Cardiac Doppler and Color Doppler Indications:     Stroke  History:         Patient has prior history of Echocardiogram examinations, most                  recent 08/12/2021. CHF, Stroke and COPD; Risk                  Factors:Hypertension, Diabetes, Dyslipidemia and Current                  Smoker.  Sonographer:     Mikki Harbor Referring Phys:  [id number] ASHISH ARORA Diagnosing Phys: Lorine Bears MD  Sonographer Comments: Technically difficult study due to poor echo windows and no subcostal window. Image acquisition challenging due to patient behavioral factors. IMPRESSIONS  1. Left ventricular ejection fraction, by estimation, is 60 to 65%. The left ventricle has normal function. The left ventricle has no regional wall motion abnormalities. There is moderate left ventricular hypertrophy. Left ventricular diastolic parameters are consistent with Grade I diastolic dysfunction (impaired relaxation).  2. Right ventricular systolic function is normal. The right ventricular size is normal. Tricuspid regurgitation signal is inadequate for assessing PA pressure.  3. The mitral valve is normal in structure. No evidence of mitral valve regurgitation. No evidence of mitral stenosis.  4. The aortic valve is normal in structure. Aortic valve regurgitation is not visualized. Aortic valve sclerosis/calcification is present, without any  evidence of aortic stenosis. FINDINGS  Left Ventricle: Left ventricular ejection fraction, by estimation, is 60 to 65%. The left ventricle has normal function. The left ventricle has no regional wall motion abnormalities. The left ventricular internal cavity size was normal in size. There is  moderate left ventricular hypertrophy. Left ventricular diastolic parameters are consistent with Grade I diastolic dysfunction (impaired relaxation). Right Ventricle: The right ventricular size is normal. No increase in right ventricular wall thickness. Right ventricular systolic function is normal. Tricuspid regurgitation signal is inadequate for assessing PA pressure. The tricuspid regurgitant velocity is 1.90 m/s, and with an assumed right atrial pressure of 8 mmHg, the estimated right ventricular systolic pressure is 22.4 mmHg. Left Atrium: Left atrial size was normal in size. Right Atrium: Right atrial size was normal in size. Pericardium: There is no evidence of pericardial effusion. Mitral Valve: The mitral valve is normal in structure. Mild mitral annular calcification. No evidence of mitral valve regurgitation. No evidence of mitral valve stenosis. MV peak gradient, 7.7 mmHg. The mean mitral valve gradient is 3.0 mmHg. Tricuspid Valve: The tricuspid valve is normal in structure. Tricuspid valve regurgitation is not demonstrated. No evidence of tricuspid stenosis. Aortic Valve: The aortic valve is normal in structure. Aortic valve regurgitation is not visualized. Aortic valve sclerosis/calcification is present, without any evidence of aortic stenosis. Aortic valve mean gradient measures 8.0 mmHg. Aortic valve peak  gradient measures 19.4 mmHg. Aortic valve area, by VTI measures 2.52 cm. Pulmonic Valve: The pulmonic valve was normal in structure. Pulmonic valve regurgitation is not visualized. No evidence of pulmonic stenosis. Aorta: The aortic root is normal in size and structure. Venous: The inferior vena cava was not  well visualized. IAS/Shunts: No atrial level shunt detected by color flow Doppler.  LEFT VENTRICLE PLAX 2D LVIDd:         4.10 cm   Diastology LVIDs:         2.20 cm   LV e' medial:    6.96 cm/s LV  PW:         1.50 cm   LV E/e' medial:  11.5 LV IVS:        1.60 cm   LV e' lateral:   7.72 cm/s LVOT diam:     2.10 cm   LV E/e' lateral: 10.4 LV SV:         95 LV SV Index:   51 LVOT Area:     3.46 cm  LEFT ATRIUM           Index LA diam:      3.40 cm 1.83 cm/m LA Vol (A4C): 36.8 ml 19.77 ml/m  AORTIC VALVE                     PULMONIC VALVE AV Area (Vmax):    1.97 cm      PV Vmax:       0.98 m/s AV Area (Vmean):   2.37 cm      PV Peak grad:  3.8 mmHg AV Area (VTI):     2.52 cm AV Vmax:           220.00 cm/s AV Vmean:          129.000 cm/s AV VTI:            0.378 m AV Peak Grad:      19.4 mmHg AV Mean Grad:      8.0 mmHg LVOT Vmax:         125.00 cm/s LVOT Vmean:        88.200 cm/s LVOT VTI:          0.275 m LVOT/AV VTI ratio: 0.73  AORTA Ao Root diam: 3.00 cm MITRAL VALVE                TRICUSPID VALVE MV Area (PHT): 5.16 cm     TR Peak grad:   14.4 mmHg MV Area VTI:   3.36 cm     TR Vmax:        190.00 cm/s MV Peak grad:  7.7 mmHg MV Mean grad:  3.0 mmHg     SHUNTS MV Vmax:       1.39 m/s     Systemic VTI:  0.28 m MV Vmean:      81.4 cm/s    Systemic Diam: 2.10 cm MV Decel Time: 147 msec MV E velocity: 80.30 cm/s MV A velocity: 113.00 cm/s MV E/A ratio:  0.71 Lorine Bears MD Electronically signed by Lorine Bears MD Signature Date/Time: 04/05/2023/1:35:33 PM    Final    EEG adult  Result Date: 04/05/2023 Rejeana Brock, MD     04/05/2023  1:39 PM History: 67 yo F with R frontal IPH Sedation: none Patient State: Awake and drowsy Technique: This EEG was acquired with electrodes placed according to the International 10-20 electrode system (including Fp1, Fp2, F3, F4, C3, C4, P3, P4, O1, O2, T3, T4, T5, T6, A1, A2, Fz, Cz, Pz). The following electrodes were missing or displaced: none. Background:  There is a posterior dominant rhythm of 8 to 9 Hz which is seen bilaterally.  In addition, there is generalized irregular delta and theta range activity intruding into the background.  There are frequent frontotemporal(Fp2, F4 > F8 > T8) sharp waves which at times do occur with periodicity never exceeding 0.5 Hz.  There is no overriding fast activity or rhythmicity, nor is there any evolution. Photic stimulation: Physiologic driving is not performed EEG Abnormalities: 1) lateralized periodic  discharges in the right frontotemporal region with sharp wave morphology 2) generalized irregular slow activity Clinical Interpretation: This EEG recorded an area of cortical irritability with the potential for seizure generation in the right frontotemporal region.  There was no definite seizure recorded. Ritta Slot, MD [facility] [phone] If 7pm- 7am, please page neurology on call as listed in AMION.  US Abdomen Limited RUQ (LIVER/GB)  Result Date: 04/05/2023 CLINICAL DATA:  Elevated alkaline phosphatase EXAM: ULTRASOUND ABDOMEN LIMITED RIGHT UPPER QUADRANT COMPARISON:  None Available. FINDINGS: Gallbladder: No mobile gallstones. Multiple small adherent shadowing gallstones. No gallbladder wall thickening. No sonographic Murphy sign noted by sonographer. Common bile duct: Common bile duct not visualized. Liver: No focal lesion identified. Increased parenchymal echogenicity. Portal vein is patent on color Doppler imaging with normal direction of blood flow towards the liver. Other: None. IMPRESSION: 1. Cholelithiasis without sonographic evidence of acute cholecystitis. 2. Common bile duct not visualized. 3. Hepatic steatosis. Electronically Signed   By: Jearld Lesch M.D.   On: 04/05/2023 11:42   CT HEAD WO CONTRAST  Result Date: 04/04/2023 CLINICAL DATA:  Hemorrhagic stroke EXAM: CT HEAD WITHOUT CONTRAST TECHNIQUE: Contiguous axial images were obtained from the base of the skull through the  vertex without intravenous contrast. RADIATION DOSE REDUCTION: This exam was performed according to the departmental dose-optimization program which includes automated exposure control, adjustment of the mA and/or kV according to patient size and/or use of iterative reconstruction technique. COMPARISON:  2:13 p.m. FINDINGS: Brain: Unchanged appearance of intraparenchymal hematoma in the high right frontal lobe, 2.0 x 1.2 cm. No new site of hemorrhage. Diffuse hypoattenuation of the white matter. CSF spaces are normal. Vascular: There is atherosclerotic calcification of both internal carotid arteries at the skull base. Skull: Negative Sinuses/Orbits: Paranasal sinuses are clear. No mastoid effusion. Normal orbits. Other: None IMPRESSION: Unchanged appearance of intraparenchymal hematoma in the high right frontal lobe, 2.0 x 1.2 cm. No new site of hemorrhage. Electronically Signed   By: Deatra Robinson M.D.   On: 04/04/2023 21:15   EEG adult  Result Date: 04/04/2023 Charlsie Quest, MD     04/04/2023  4:06 PM Patient Name: Kaitlyn Ruiz MRN: [id number] Epilepsy Attending: Charlsie Quest Referring Physician/Provider: Milon Dikes, MD Date: 04/04/2023 Duration: 26.31 mins Patient history:  67 y.o. female with above past medical history presented with left-sided focal seizures and unresponsiveness and noted to have a right frontal intraparenchymal hemorrhage. EEG to evaluate for seizure Level of alertness: comatose AEDs during EEG study: LEV, ativan Technical aspects: This EEG study was done with scalp electrodes positioned according to the 10-20 International system of electrode placement. Electrical activity was reviewed with band pass filter of 1-70Hz , sensitivity of 7 uV/mm, display speed of 75mm/sec with a 60Hz  notched filter applied as appropriate. EEG data were recorded continuously and digitally stored.  Video monitoring was available and reviewed as appropriate. Description: EEG showed continuous 3 to 6 Hz  theta-delta slowing in left hemisphere as well as low amplitude 2-3Hz  delta slowing in right hemisphere. Hyperventilation and photic stimulation were not performed.   ABNORMALITY - Continuous slow, generalized and lateralized right hemisphere IMPRESSION: This study is suggestive of cortical dysfunction arising from right hemisphere likely secondary to underlying ICH. Additionally there is severe diffuse encephalopathy. No seizures or epileptiform discharges were seen throughout the recording. Charlsie Quest   CT HEAD CODE STROKE WO CONTRAST  Result Date: 04/04/2023 CLINICAL DATA:  Code stroke. Neuro deficit, acute, stroke suspected. Seizure-like activity. EXAM: CT  HEAD WITHOUT CONTRAST TECHNIQUE: Contiguous axial images were obtained from the base of the skull through the vertex without intravenous contrast. RADIATION DOSE REDUCTION: This exam was performed according to the departmental dose-optimization program which includes automated exposure control, adjustment of the mA and/or kV according to patient size and/or use of iterative reconstruction technique. COMPARISON:  Head CT 08/11/2021 FINDINGS: Brain: An acute parenchymal subcortical hemorrhage in the high right frontoparietal region near the vertex measures 2.5 x 1.8 x 1.5 cm (estimated volume of 3.4 mL). There is minimal surrounding edema without significant mass effect. There is no evidence of an acute infarct separate from the hemorrhage. Patchy and confluent hypodensities in the cerebral white matter bilaterally may have mildly progressed and are nonspecific but compatible with extensive chronic small vessel ischemic disease. There are chronic lacunar infarcts in the thalami and pons. There is mild cerebral atrophy. No midline shift or extra-axial fluid collection is evident. Vascular: Calcified atherosclerosis at the skull base. No hyperdense vessel. Skull: No acute fracture or suspicious osseous lesion. Sinuses/Orbits: Visualized paranasal sinuses  and mastoid air cells are clear. Unremarkable orbits. Other: None. ASPECTS [facility] Stroke Program Early CT Score) Not scored in the presence of acute hemorrhage. Critical Value/emergent results were called by telephone at the time of interpretation on 04/04/2023 at 2:23 pm to Dr. Milon Dikes, who verbally acknowledged these results. IMPRESSION: 1. 2.5 cm acute right frontoparietal parenchymal hemorrhage. 2. Extensive chronic small vessel ischemic disease. Electronically Signed   By: Sebastian Ache M.D.   On: 04/04/2023 14:24     The results of significant diagnostics from this hospitalization (including imaging, microbiology, ancillary and laboratory) are listed below for reference.     Microbiology: Recent Results (from the past 240 hour(s))  Resp panel by RT-PCR (RSV, Flu A&B, Covid) Anterior Nasal Swab     Status: None   Collection Time: 04/12/23  7:01 AM   Specimen: Anterior Nasal Swab  Result Value Ref Range Status   SARS Coronavirus 2 by RT PCR NEGATIVE NEGATIVE Final   Influenza A by PCR NEGATIVE NEGATIVE Final   Influenza B by PCR NEGATIVE NEGATIVE Final    Comment: (NOTE) The Xpert Xpress SARS-CoV-2/FLU/RSV plus assay is intended as an aid in the diagnosis of influenza from Nasopharyngeal swab specimens and should not be used as a sole basis for treatment. Nasal washings and aspirates are unacceptable for Xpert Xpress SARS-CoV-2/FLU/RSV testing.  Fact Sheet for Patients: BloggerCourse.com  Fact Sheet for Healthcare Providers: SeriousBroker.it  This test is not yet approved or cleared by the [place] FDA and has been authorized for detection and/or diagnosis of SARS-CoV-2 by FDA under an Emergency Use Authorization (EUA). This EUA will remain in effect (meaning this test can be used) for the duration of the COVID-19 declaration under Section 564(b)(1) of the Act, 21 U.S.C. section 360bbb-3(b)(1), unless the authorization is  terminated or revoked.     Resp Syncytial Virus by PCR NEGATIVE NEGATIVE Final    Comment: (NOTE) Fact Sheet for Patients: BloggerCourse.com  Fact Sheet for Healthcare Providers: SeriousBroker.it  This test is not yet approved or cleared by the [place] FDA and has been authorized for detection and/or diagnosis of SARS-CoV-2 by FDA under an Emergency Use Authorization (EUA). This EUA will remain in effect (meaning this test can be used) for the duration of the COVID-19 declaration under Section 564(b)(1) of the Act, 21 U.S.C. section 360bbb-3(b)(1), unless the authorization is terminated or revoked.  Performed at [facility], [address] [place], [place] [address]      Labs: BNP (last 3 results) No results for input(s): "BNP" in the last 8760 hours. Basic Metabolic Panel: Recent Labs  Lab 04/15/23 0044  NA 134*  K 4.0  CL 100  CO2 27  GLUCOSE 135*  BUN 16  CREATININE 0.61  CALCIUM 8.8*   Liver Function Tests: No results for input(s): "AST", "ALT", "ALKPHOS", "BILITOT", "PROT", "ALBUMIN" in the last 168 hours. No results for input(s): "LIPASE", "AMYLASE" in the last 168 hours. No results for input(s): "AMMONIA" in the last 168 hours. CBC: Recent Labs  Lab 04/15/23 0044  WBC 8.6  NEUTROABS 4.8  HGB 14.7  HCT 44.1  MCV 93.0  PLT 211   Cardiac Enzymes: No results for input(s): "CKTOTAL", "CKMB", "CKMBINDEX", "TROPONINI" in the last 168 hours. BNP: Invalid input(s): "POCBNP" CBG: Recent Labs  Lab 04/12/23 0657 04/12/23 0736 04/12/23 1152 04/15/23 0839 04/15/23 1207  GLUCAP 105* 108* 163* 98 93   D-Dimer No results for input(s): "DDIMER" in the last 72 hours. Hgb A1c No results for input(s): "HGBA1C" in the last 72 hours. Lipid Profile No results for input(s): "CHOL", "HDL", "LDLCALC", "TRIG", "CHOLHDL", "LDLDIRECT" in the last 72 hours. Thyroid function studies No results for  input(s): "TSH", "T4TOTAL", "T3FREE", "THYROIDAB" in the last 72 hours.  Invalid input(s): "FREET3" Anemia work up No results for input(s): "VITAMINB12", "FOLATE", "FERRITIN", "TIBC", "IRON", "RETICCTPCT" in the last 72 hours. Urinalysis    Component Value Date/Time   COLORURINE AMBER (A) 04/06/2023 0012   APPEARANCEUR HAZY (A) 04/06/2023 0012   LABSPEC 1.021 04/06/2023 0012   PHURINE 5.0 04/06/2023 0012   GLUCOSEU 50 (A) 04/06/2023 0012   HGBUR NEGATIVE 04/06/2023 0012   BILIRUBINUR NEGATIVE 04/06/2023 0012   KETONESUR NEGATIVE 04/06/2023 0012   PROTEINUR NEGATIVE 04/06/2023 0012   NITRITE POSITIVE (A) 04/06/2023 0012   LEUKOCYTESUR SMALL (A) 04/06/2023 0012   Sepsis Labs Recent Labs  Lab 04/15/23 0044  WBC 8.6   Microbiology Recent Results (from the past 240 hour(s))  Resp panel by RT-PCR (RSV, Flu A&B, Covid) Anterior Nasal Swab     Status: None   Collection Time: 04/12/23  7:01 AM   Specimen: Anterior Nasal Swab  Result Value Ref Range Status   SARS Coronavirus 2 by RT PCR NEGATIVE NEGATIVE Final   Influenza A by PCR NEGATIVE NEGATIVE Final   Influenza B by PCR NEGATIVE NEGATIVE Final    Comment: (NOTE) The Xpert Xpress SARS-CoV-2/FLU/RSV plus assay is intended as an aid in the diagnosis of influenza from Nasopharyngeal swab specimens and should not be used as a sole basis for treatment. Nasal washings and aspirates are unacceptable for Xpert Xpress SARS-CoV-2/FLU/RSV testing.  Fact Sheet for Patients: BloggerCourse.com  Fact Sheet for Healthcare Providers: SeriousBroker.it  This test is not yet approved or cleared by the [place] FDA and has been authorized for detection and/or diagnosis of SARS-CoV-2 by FDA under an Emergency Use Authorization (EUA). This EUA will remain in effect (meaning this test can be used) for the duration of the COVID-19 declaration under Section 564(b)(1) of the Act, 21  U.S.C. section 360bbb-3(b)(1), unless the authorization is terminated or revoked.     Resp Syncytial Virus by PCR NEGATIVE NEGATIVE Final    Comment: (NOTE) Fact Sheet for Patients: BloggerCourse.com  Fact Sheet for Healthcare Providers: SeriousBroker.it  This test is not yet approved or cleared by the [place] FDA and has been authorized for detection and/or diagnosis of SARS-CoV-2 by FDA  under an Emergency Use Authorization (EUA). This EUA will remain in effect (meaning this test can be used) for the duration of the COVID-19 declaration under Section 564(b)(1) of the Act, 21 U.S.C. section 360bbb-3(b)(1), unless the authorization is terminated or revoked.  Performed at [facility], [address] [place], [place] [address]      Time coordinating discharge:  I have spent 35 minutes face to face with the patient and on the ward discussing the patients care, assessment, plan and disposition with other care givers. >50% of the time was devoted counseling the patient about the risks and benefits of treatment/Discharge disposition and coordinating care.   SIGNED:   Miguel Rota, MD  [facility] 04/15/2023, 1:46 PM   If 7PM-7AM, please contact night-coverage

## 2023-04-15 NOTE — Assessment & Plan Note (Signed)
-
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
## 2023-04-15 NOTE — ED Notes (Signed)
Patient to IR at this time.   

## 2023-04-15 NOTE — Hospital Course (Addendum)
   Brief Narrative:  67 year old with DM 2, HTN, depression, CVA who has chronic G-tube in place admitted to the hospital for dislodged tube.  IR consulted.  Urgent tube was replaced on 11/15 morning.  Medically stable for discharge back to facility.   Assessment & Plan:  Principal Problem:   Dislodged gastrostomy tube Active Problems:   Chronic obstructive pulmonary disease (COPD) (HCC)   Type 2 diabetes mellitus with peripheral neuropathy (HCC)   Depression   Dyslipidemia   Dislodged gastrostomy tube History of CVA, bedbound, dysphagia NG tube placed in gastrostomy stoma.  IR replaced it on 11/15   Chronic obstructive pulmonary disease (COPD) (HCC) Bronchodilators   Type 2 diabetes mellitus with peripheral neuropathy (HCC) Sliding scale and Accu-Chek. Gabapentin   Dyslipidemia Pravachol   Depression [facility]  Epilepsy/right frontal CVA - Recently diagnosed and discharged on 11/12, on Keppra and Vimpat.   DVT prophylaxis: enoxaparin (LOVENOX) injection 40 mg Start: 04/15/23 1000 Code Status: DNR Family Communication:   G-tube replaced.  Will return back to facility  Subjective:  Seen after her G-tube replacement.  Doing well.  No complaints Has some aphasia but able to have very basic conversation.  Examination:  General exam: Appears calm and comfortable  Respiratory system: Clear to auscultation. Respiratory effort normal. Cardiovascular system: S1 & S2 heard, RRR. No JVD, murmurs, rubs, gallops or clicks. No pedal edema. Gastrointestinal system: Abdomen is nondistended, soft and nontender. No organomegaly or masses felt. Normal bowel sounds heard. Central nervous system: Alert and oriented.  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 Skin: No rashes, lesions or ulcers G-tube in place

## 2023-04-15 NOTE — ED Notes (Signed)
ED MD made aware that 0500 labs were unable to be drawn.  Patient had lab work completed at 0044.  Per ED MD, labs do not need to be drawn at this time

## 2023-04-15 NOTE — Consult Note (Signed)
PHARMACY CONSULT NOTE - ELECTROLYTES  Pharmacy Consult for Electrolyte Monitoring and Replacement   Recent Labs: Height: 5\' 5"  (165.1 cm) Weight: 78.7 kg (173 lb 8 oz) IBW/kg (Calculated) : 57 Estimated Creatinine Clearance: 72.7 mL/min (by C-G formula based on SCr of 0.61 mg/dL). Potassium (mmol/L)  Date Value  04/15/2023 4.0  12/26/2013 3.4 (L)   Magnesium (mg/dL)  Date Value  95/62/1308 1.7   Calcium (mg/dL)  Date Value  65/78/4696 8.8 (L)   Calcium, Total (mg/dL)  Date Value  29/52/8413 9.0   Albumin (g/dL)  Date Value  24/40/1027 2.8 (L)  12/26/2013 3.6   Phosphorus (mg/dL)  Date Value  25/36/6440 2.6   Sodium (mmol/L)  Date Value  04/15/2023 134 (L)  12/26/2013 138    Assessment  Kaitlyn Ruiz is a 67 y.o. female presenting with G-tube displacement. PMH significant for T2DM, HTN, depression, and CVA. Pharmacy has been consulted to monitor and replace electrolytes.  Diet: NPO MIVF: NS+79mEqK @ 40 mL/hr   Goal of Therapy: Electrolytes WNL  Plan:  No replacement currently indicated Check BMP, Mg, Phos with AM labs  Thank you for allowing pharmacy to be a part of this patient's care.  Bettey Costa, PharmD Clinical Pharmacist 04/15/2023 9:29 AM

## 2023-04-15 NOTE — Assessment & Plan Note (Signed)
-   We will continue her inhalers. 

## 2023-04-15 NOTE — Assessment & Plan Note (Signed)
-   The patient will be admitted to an observation medical bed. - IR consult was placed by the EDP. - Her gastrostomy stoma is currently open via inserted NG tube.

## 2023-04-15 NOTE — ED Notes (Signed)
Patient repositioned in bed for comfort.  Attempted to collect AM labs.  Unable to obtain at this time.

## 2023-04-15 NOTE — Assessment & Plan Note (Signed)
-   We will continue statin therapy. 

## 2023-04-15 NOTE — ED Notes (Signed)
Patient returned from IR

## 2023-04-15 NOTE — Procedures (Signed)
Interventional Radiology Procedure Note  Procedure: Replacement of gastrostomy tube under fluoro  Complications: None  Estimated Blood Loss: None  Findings: Gastrostomy tube tract dilated over guidewire. New 18 Fr balloon retention gastrostomy tube placed. Tip in today of stomach, confirmed by fluoro after contrast injection and OK to use.  Jodi Marble. Fredia Sorrow, M.D Pager:  [phone]

## 2023-06-05 ENCOUNTER — Emergency Department
Admission: EM | Admit: 2023-06-05 | Discharge: 2023-06-05 | Disposition: A | Discharge status: Home / Self Care | Payer: Medicare Other | Attending: Emergency Medicine | Admitting: Emergency Medicine

## 2023-06-05 ENCOUNTER — Other Ambulatory Visit: Payer: Self-pay

## 2023-06-05 DIAGNOSIS — E119 Type 2 diabetes mellitus without complications: Secondary | ICD-10-CM | POA: Insufficient documentation

## 2023-06-05 DIAGNOSIS — Z8673 Personal history of transient ischemic attack (TIA), and cerebral infarction without residual deficits: Secondary | ICD-10-CM | POA: Insufficient documentation

## 2023-06-05 DIAGNOSIS — Y732 Prosthetic and other implants, materials and accessory gastroenterology and urology devices associated with adverse incidents: Secondary | ICD-10-CM | POA: Diagnosis not present

## 2023-06-05 DIAGNOSIS — T85518A Breakdown (mechanical) of other gastrointestinal prosthetic devices, implants and grafts, initial encounter: Secondary | ICD-10-CM | POA: Diagnosis present

## 2023-06-05 DIAGNOSIS — K9423 Gastrostomy malfunction: Secondary | ICD-10-CM

## 2023-06-05 DIAGNOSIS — I10 Essential (primary) hypertension: Secondary | ICD-10-CM | POA: Insufficient documentation

## 2023-06-05 NOTE — ED Provider Triage Note (Signed)
 Emergency Medicine Provider Triage Evaluation Note  Kaitlyn Ruiz , a 68 y.o. female  was evaluated in triage.  Pt complains of g tube dysfunction. Facility reported that the valve is leaking. From a SNF, patient unable to provide any history.  Review of Systems  Positive:  Negative: pain  Physical Exam  There were no vitals taken for this visit. Gen:   Awake, no distress   Resp:  Normal effort  MSK:   Moves extremities without difficulty Other:  G tube appears to be in place, the valve for the feedings appears to be leaking.   Medical Decision Making  Medically screening exam initiated at 1:41 PM.  Appropriate orders placed.  Kaitlyn Ruiz was informed that the remainder of the evaluation will be completed by another provider, this initial triage assessment does not replace that evaluation, and the importance of remaining in the ED until their evaluation is complete.    Kaitlyn Tinnie DELENA, PA-C 06/05/23 1345

## 2023-06-05 NOTE — ED Triage Notes (Signed)
 Arrives from [facility] via [facility].  Feeding tube broken. Per EMS, broke the fitting off of the tube and are is now leaking.  VS wnl.

## 2023-06-05 NOTE — Discharge Instructions (Signed)
 The damaged PEG tube was replaced with a new 18 French tube, balloon filled with 8 mL Saline. There is some hypertrophy of the stoma tissue.  Keep the area clean and dry to reduce inflammation.

## 2023-06-05 NOTE — ED Provider Notes (Signed)
 [facility] Provider Note    Event Date/Time   First MD Initiated Contact with Patient 06/05/23 1532     (approximate)   History   G-Tube Complaint   HPI  Kaitlyn Ruiz is a 68 y.o. female with history of hypertension diabetes stroke and PEG tube who was sent to the ED from her SNF due to broken PEG tube.  No other acute complaints.     Physical Exam   Triage Vital Signs: ED Triage Vitals  Encounter Vitals Group     BP 06/05/23 1341 (!) 167/57     Systolic BP Percentile --      Diastolic BP Percentile --      Pulse Rate 06/05/23 1341 66     Resp 06/05/23 1341 19     Temp 06/05/23 1341 (!) 97.2 F (36.2 C)     Temp Source 06/05/23 1341 Axillary     SpO2 06/05/23 1341 96 %     Weight --      Height --      Head Circumference --      Peak Flow --      Pain Score 06/05/23 1339 0     Pain Loc --      Pain Education --      Exclude from Growth Chart --     Most recent vital signs: Vitals:   06/05/23 1341  BP: (!) 167/57  Pulse: 66  Resp: 19  Temp: (!) 97.2 F (36.2 C)  SpO2: 96%    General: Awake, no distress.  Calm, nontoxic CV:  Good peripheral perfusion.  Resp:  Normal effort.  Abd:  No distention.  Soft nontender.  Gastrostomy site with mild hypertrophy of the soft tissue.  No inflammatory skin changes or purulent drainage.  there is an identifiable crack in the PEG tube material just below the Which becomes a gaping defect when traction is applied    ED Results / Procedures / Treatments   Labs (all labs ordered are listed, but only abnormal results are displayed) Labs Reviewed - No data to display   RADIOLOGY    PROCEDURES:  FEEDING TUBE REPLACEMENT  Date/Time: 06/05/2023 3:58 PM  Performed by: Viviann Pastor, MD Authorized by: Viviann Pastor, MD  Consent: The procedure was performed in an emergent situation. Risks and benefits: risks, benefits and alternatives were discussed Time out: Immediately prior to  procedure a time out was called to verify the correct patient, procedure, equipment, support staff and site/side marked as required. Preparation: Patient was prepped and draped in the usual sterile fashion. Indications: tube cracked Local anesthesia used: no  Anesthesia: Local anesthesia used: no  Sedation: Patient sedated: no  Tube type: gastrostomy Patient position: supine Procedure type: replacement Tube size: 18 Fr Endoscope used: no Bulb inflation volume: 8 (ml) Bulb inflation fluid: normal saline Placement/position confirmation: gastric contents aspirated Tube placement difficulty: minimal Patient tolerance: patient tolerated the procedure well with no immediate complications      MEDICATIONS ORDERED IN ED: Medications - No data to display   IMPRESSION / MDM / ASSESSMENT AND PLAN / ED COURSE  I reviewed the triage vital signs and the nursing notes.                             Patient sent to the ED due to damage to her PEG tube preventing it from being used.  On exam, there is an identifiable  crack in the material just below the Which becomes a gaping defect when traction is applied.  Tube was replaced without event.       FINAL CLINICAL IMPRESSION(S) / ED DIAGNOSES   Final diagnoses:  Gastrostomy tube dysfunction (HCC)     Rx / DC Orders   ED Discharge Orders     None        Note:  This document was prepared using Dragon voice recognition software and may include unintentional dictation errors.   Viviann Pastor, MD 06/05/23 [phone]

## 2023-07-07 ENCOUNTER — Inpatient Hospital Stay: Payer: Medicare Other | Admitting: Neurology

## 2023-07-07 ENCOUNTER — Encounter: Payer: Self-pay | Admitting: Neurology

## 2024-01-27 ENCOUNTER — Other Ambulatory Visit: Payer: Self-pay

## 2024-01-27 ENCOUNTER — Observation Stay
Admission: EM | Admit: 2024-01-27 | Discharge: 2024-01-29 | Disposition: A | Discharge status: Skilled Nursing Facility | Attending: Internal Medicine | Admitting: Internal Medicine

## 2024-01-27 DIAGNOSIS — E119 Type 2 diabetes mellitus without complications: Secondary | ICD-10-CM | POA: Diagnosis not present

## 2024-01-27 DIAGNOSIS — Z7982 Long term (current) use of aspirin: Secondary | ICD-10-CM | POA: Insufficient documentation

## 2024-01-27 DIAGNOSIS — J440 Chronic obstructive pulmonary disease with acute lower respiratory infection: Secondary | ICD-10-CM | POA: Insufficient documentation

## 2024-01-27 DIAGNOSIS — F32A Depression, unspecified: Secondary | ICD-10-CM | POA: Diagnosis present

## 2024-01-27 DIAGNOSIS — R471 Dysarthria and anarthria: Secondary | ICD-10-CM

## 2024-01-27 DIAGNOSIS — Z794 Long term (current) use of insulin: Secondary | ICD-10-CM | POA: Diagnosis not present

## 2024-01-27 DIAGNOSIS — I619 Nontraumatic intracerebral hemorrhage, unspecified: Secondary | ICD-10-CM | POA: Diagnosis not present

## 2024-01-27 DIAGNOSIS — Z79899 Other long term (current) drug therapy: Secondary | ICD-10-CM | POA: Diagnosis not present

## 2024-01-27 DIAGNOSIS — G40909 Epilepsy, unspecified, not intractable, without status epilepticus: Secondary | ICD-10-CM | POA: Insufficient documentation

## 2024-01-27 DIAGNOSIS — I69391 Dysphagia following cerebral infarction: Secondary | ICD-10-CM

## 2024-01-27 DIAGNOSIS — E1142 Type 2 diabetes mellitus with diabetic polyneuropathy: Secondary | ICD-10-CM | POA: Diagnosis present

## 2024-01-27 DIAGNOSIS — I10 Essential (primary) hypertension: Secondary | ICD-10-CM | POA: Diagnosis not present

## 2024-01-27 DIAGNOSIS — K9423 Gastrostomy malfunction: Secondary | ICD-10-CM | POA: Diagnosis present

## 2024-01-27 DIAGNOSIS — J4489 Other specified chronic obstructive pulmonary disease: Secondary | ICD-10-CM | POA: Diagnosis present

## 2024-01-27 DIAGNOSIS — Z8673 Personal history of transient ischemic attack (TIA), and cerebral infarction without residual deficits: Secondary | ICD-10-CM | POA: Insufficient documentation

## 2024-01-27 DIAGNOSIS — T85528A Displacement of other gastrointestinal prosthetic devices, implants and grafts, initial encounter: Principal | ICD-10-CM

## 2024-01-27 NOTE — ED Triage Notes (Signed)
 Pt brought in by EMS from [facility] post accidentally G tube removal. No redness/swelling noted to site. Pt at baseline per EMS, no meds given by EMS.

## 2024-01-28 ENCOUNTER — Observation Stay: Admitting: Radiology

## 2024-01-28 ENCOUNTER — Encounter: Payer: Self-pay | Admitting: Internal Medicine

## 2024-01-28 DIAGNOSIS — E1142 Type 2 diabetes mellitus with diabetic polyneuropathy: Secondary | ICD-10-CM | POA: Diagnosis not present

## 2024-01-28 DIAGNOSIS — K9423 Gastrostomy malfunction: Secondary | ICD-10-CM | POA: Diagnosis not present

## 2024-01-28 DIAGNOSIS — T85528A Displacement of other gastrointestinal prosthetic devices, implants and grafts, initial encounter: Secondary | ICD-10-CM | POA: Diagnosis not present

## 2024-01-28 DIAGNOSIS — I10 Essential (primary) hypertension: Secondary | ICD-10-CM

## 2024-01-28 HISTORY — PX: IR REPLC GASTRO/COLONIC TUBE PERCUT W/FLUORO: IMG2333

## 2024-01-28 LAB — BASIC METABOLIC PANEL WITH GFR
Anion gap: 11 (ref 5–15)
Anion gap: 11 (ref 5–15)
BUN: 16 mg/dL (ref 8–23)
BUN: 14 mg/dL (ref 8–23)
CO2: 28 mmol/L (ref 22–32)
CO2: 28 mmol/L (ref 22–32)
Calcium: 9 mg/dL (ref 8.9–10.3)
Calcium: 9 mg/dL (ref 8.9–10.3)
Chloride: 99 mmol/L (ref 98–111)
Chloride: 102 mmol/L (ref 98–111)
Creatinine, Ser: 0.55 mg/dL (ref 0.44–1.00)
Creatinine, Ser: 0.65 mg/dL (ref 0.44–1.00)
GFR, Estimated: >60 mL/min (ref >60)
GFR, Estimated: >60 mL/min (ref >60)
Glucose, Bld: 70 mg/dL (ref 70–99)
Glucose, Bld: 77 mg/dL (ref 70–99)
Potassium: 3.8 mmol/L (ref 3.5–5.1)
Potassium: 3.2 mmol/L — ABNORMAL LOW (ref 3.5–5.1)
Sodium: 138 mmol/L (ref 135–145)
Sodium: 141 mmol/L (ref 135–145)

## 2024-01-28 LAB — CBC
HCT: 46.3 % — ABNORMAL HIGH (ref 36.0–46.0)
Hemoglobin: 15.5 g/dL — ABNORMAL HIGH (ref 12.0–15.0)
MCH: 30.2 pg (ref 26.0–34.0)
MCHC: 33.5 g/dL (ref 30.0–36.0)
MCV: 90.3 fL (ref 80.0–100.0)
Platelets: 176 K/uL (ref 150–400)
RBC: 5.13 MIL/uL — ABNORMAL HIGH (ref 3.87–5.11)
RDW: 12.1 % (ref 11.5–15.5)
WBC: 9.7 K/uL (ref 4.0–10.5)
nRBC: 0 % (ref 0.0–0.2)

## 2024-01-28 LAB — CBC WITH DIFFERENTIAL/PLATELET
Abs Immature Granulocytes: 0.02 K/uL (ref 0.00–0.07)
Basophils Absolute: 0 K/uL (ref 0.0–0.1)
Basophils Relative: 0 %
Eosinophils Absolute: 0.2 K/uL (ref 0.0–0.5)
Eosinophils Relative: 3 %
HCT: 48.4 % — ABNORMAL HIGH (ref 36.0–46.0)
Hemoglobin: 16.1 g/dL — ABNORMAL HIGH (ref 12.0–15.0)
Immature Granulocytes: 0 %
Lymphocytes Relative: 36 %
Lymphs Abs: 3.4 K/uL (ref 0.7–4.0)
MCH: 30.7 pg (ref 26.0–34.0)
MCHC: 33.3 g/dL (ref 30.0–36.0)
MCV: 92.2 fL (ref 80.0–100.0)
Monocytes Absolute: 0.6 K/uL (ref 0.1–1.0)
Monocytes Relative: 7 %
Neutro Abs: 5.2 K/uL (ref 1.7–7.7)
Neutrophils Relative %: 54 %
Platelets: 162 K/uL (ref 150–400)
RBC: 5.25 MIL/uL — ABNORMAL HIGH (ref 3.87–5.11)
RDW: 12.4 % (ref 11.5–15.5)
WBC: 9.6 K/uL (ref 4.0–10.5)
nRBC: 0 % (ref 0.0–0.2)

## 2024-01-28 LAB — GLUCOSE, CAPILLARY
Glucose-Capillary: 78 mg/dL (ref 70–99)
Glucose-Capillary: 78 mg/dL (ref 70–99)
Glucose-Capillary: 90 mg/dL (ref 70–99)
Glucose-Capillary: 79 mg/dL (ref 70–99)
Glucose-Capillary: 99 mg/dL (ref 70–99)
Glucose-Capillary: 87 mg/dL (ref 70–99)

## 2024-01-28 LAB — HIV ANTIBODY (ROUTINE TESTING W REFLEX): HIV Screen 4th Generation wRfx: NONREACTIVE

## 2024-01-28 LAB — PROTIME-INR
INR: 1 (ref 0.8–1.2)
Prothrombin Time: 14.1 s (ref 11.4–15.2)

## 2024-01-28 MED ORDER — DORZOLAMIDE HCL 2 % OP SOLN
1.0000 [drp] | Freq: Two times a day (BID) | OPHTHALMIC | Status: DC
  Administered 2024-01-28 – 2024-01-29 (×3): 1 [drp] via OPHTHALMIC
  Filled 2024-01-28: qty 10

## 2024-01-28 MED ORDER — INSULIN ASPART 100 UNIT/ML IJ SOLN: 0.0000 [IU] | INTRAMUSCULAR | Status: DC

## 2024-01-28 MED ORDER — ACETAMINOPHEN 650 MG RE SUPP: 650.0000 mg | Freq: Four times a day (QID) | RECTAL | Status: DC | PRN

## 2024-01-28 MED ORDER — SODIUM CHLORIDE 0.9 % IV SOLN
150.0000 mg | Freq: Two times a day (BID) | INTRAVENOUS | Status: DC
  Administered 2024-01-28 – 2024-01-29 (×3): 150 mg via INTRAVENOUS
  Filled 2024-01-28 (×4): qty 15

## 2024-01-28 MED ORDER — CITALOPRAM HYDROBROMIDE 10 MG PO TABS
10.0000 mg | ORAL_TABLET | Freq: Every day | ORAL | Status: DC
  Administered 2024-01-28 – 2024-01-29 (×2): 10 mg
  Filled 2024-01-28 (×2): qty 1

## 2024-01-28 MED ORDER — IPRATROPIUM-ALBUTEROL 0.5-2.5 (3) MG/3ML IN SOLN
3.0000 mL | RESPIRATORY_TRACT | Status: DC
  Administered 2024-01-28: 3 mL via RESPIRATORY_TRACT
  Filled 2024-01-28: qty 3

## 2024-01-28 MED ORDER — PRAVASTATIN SODIUM 20 MG PO TABS
20.0000 mg | ORAL_TABLET | Freq: Every day | ORAL | Status: DC
  Administered 2024-01-28: 20 mg
  Filled 2024-01-28: qty 1

## 2024-01-28 MED ORDER — FLUDROCORTISONE ACETATE 0.1 MG PO TABS
0.1000 mg | ORAL_TABLET | Freq: Every day | ORAL | Status: DC
  Administered 2024-01-28 – 2024-01-29 (×2): 0.1 mg
  Filled 2024-01-28 (×2): qty 1

## 2024-01-28 MED ORDER — LEVETIRACETAM (KEPPRA) 500 MG/5 ML ADULT IV PUSH
1500.0000 mg | Freq: Two times a day (BID) | INTRAVENOUS | Status: DC
  Administered 2024-01-28 – 2024-01-29 (×3): 1500 mg via INTRAVENOUS
  Filled 2024-01-28 (×4): qty 15

## 2024-01-28 MED ORDER — TIMOLOL MALEATE 0.5 % OP SOLN
1.0000 [drp] | Freq: Two times a day (BID) | OPHTHALMIC | Status: DC
  Administered 2024-01-28 – 2024-01-29 (×3): 1 [drp] via OPHTHALMIC
  Filled 2024-01-28: qty 5

## 2024-01-28 MED ORDER — LACTATED RINGERS IV SOLN: INTRAVENOUS | Status: AC

## 2024-01-28 MED ORDER — ACETAMINOPHEN 325 MG PO TABS: 650.0000 mg | ORAL_TABLET | Freq: Four times a day (QID) | ORAL | Status: DC | PRN

## 2024-01-28 MED ORDER — IPRATROPIUM-ALBUTEROL 0.5-2.5 (3) MG/3ML IN SOLN: 3.0000 mL | RESPIRATORY_TRACT | Status: DC | PRN

## 2024-01-28 MED ORDER — SODIUM CHLORIDE 0.9 % IV SOLN: Freq: Once | INTRAVENOUS | Status: AC

## 2024-01-28 MED ORDER — STERILE WATER FOR INJECTION IJ SOLN
INTRAMUSCULAR | Status: AC
  Filled 2024-01-28: qty 10

## 2024-01-28 MED ORDER — IPRATROPIUM-ALBUTEROL 0.5-2.5 (3) MG/3ML IN SOLN
3.0000 mL | Freq: Four times a day (QID) | RESPIRATORY_TRACT | Status: DC
  Administered 2024-01-28 – 2024-01-29 (×5): 3 mL via RESPIRATORY_TRACT
  Filled 2024-01-28 (×5): qty 3

## 2024-01-28 MED ORDER — ASPIRIN 81 MG PO CHEW
81.0000 mg | CHEWABLE_TABLET | Freq: Every day | ORAL | Status: DC
  Administered 2024-01-28 – 2024-01-29 (×2): 81 mg
  Filled 2024-01-28 (×2): qty 1

## 2024-01-28 MED ORDER — IOHEXOL 300 MG/ML  SOLN
8.0000 mL | Freq: Once | INTRAMUSCULAR | Status: AC | PRN
  Administered 2024-01-28: 8 mL

## 2024-01-28 MED ORDER — HYDRALAZINE HCL 20 MG/ML IJ SOLN: 5.0000 mg | INTRAMUSCULAR | Status: DC | PRN

## 2024-01-28 MED ORDER — SCOPOLAMINE 1 MG/3DAYS TD PT72
1.0000 | MEDICATED_PATCH | TRANSDERMAL | Status: DC
  Administered 2024-01-28: 1 mg via TRANSDERMAL
  Filled 2024-01-28: qty 1

## 2024-01-28 MED ORDER — DORZOLAMIDE HCL-TIMOLOL MAL PF 2-0.5 % OP SOLN: 1.0000 [drp] | Freq: Two times a day (BID) | OPHTHALMIC | Status: DC

## 2024-01-28 MED ORDER — GABAPENTIN 250 MG/5ML PO SOLN
300.0000 mg | Freq: Three times a day (TID) | ORAL | Status: DC
  Administered 2024-01-28 – 2024-01-29 (×4): 300 mg
  Filled 2024-01-28 (×5): qty 6

## 2024-01-28 MED ORDER — POTASSIUM CHLORIDE IN NACL 40-0.9 MEQ/L-% IV SOLN
INTRAVENOUS | Status: DC
  Filled 2024-01-28 (×2): qty 1000

## 2024-01-28 MED ORDER — POTASSIUM CHLORIDE 10 MEQ/100ML IV SOLN
10.0000 meq | INTRAVENOUS | Status: AC
  Administered 2024-01-28 (×2): 10 meq via INTRAVENOUS
  Filled 2024-01-28 (×2): qty 100

## 2024-01-28 MED ORDER — PRO-STAT AWC PO LIQD: 30.0000 mL | Freq: Two times a day (BID) | ORAL | Status: DC
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
## 2024-01-28 NOTE — Progress Notes (Signed)
 A consult was placed to the hospital's IV Nurse for additional iv access; (fluids and meds not compatible);  limited basically to R arm only as Left arm is contracted;  able to place a 2nd iv site on the 3rd attempt--even using using ultrasound ;  very poor veins; suggest PICC if either of the 2 sites she has need to be replaced.

## 2024-01-28 NOTE — Procedures (Signed)
 Interventional Radiology Procedure:   Indications: Dislodged gastrostomy tube  Procedure: Gastrostomy tube replacement  Findings: Gastrostomy tube was replaced with fluoroscopy.  18 Fr tube in place and ready to be used.   Complications: None     EBL: Minimal  Plan: Gastrostomy tube is ready to be used.   Waynette Towers R. Philip, MD  Pager: [phone]

## 2024-01-28 NOTE — Progress Notes (Signed)
 Nonbillable note Patient admitted to the hospital following dislodgment of her PEG tube Appreciate IR input, PEG tube has been replaced and ready to use Will resume tube feeds

## 2024-01-28 NOTE — ED Notes (Signed)
 Placed pt DNR on the [facility] monitor and also placed DNR bracelet on the pt.

## 2024-01-28 NOTE — ED Provider Notes (Addendum)
 [name] Provider Note    Event Date/Time   First MD Initiated Contact with Patient 01/27/24 2348     (approximate)   History   Medical Management of Chronic Issues   HPI Kaitlyn Ruiz is a 68 y.o. female who has extensive chronic medical issues after having had a CVA.  She has a chronic PEG tube and lives at a skilled nursing facility.  She has had frequent issues with PEG tube malfunction and displacement.  She presents tonight by EMS after reportedly accidentally dislodging the PEG tube just prior to arrival.  She is able to communicate minimally but does not seem to be indicating any pain or distress at this time.     Physical Exam   Triage Vital Signs: ED Triage Vitals [01/27/24 2350]  Encounter Vitals Group     BP (!) 173/68     Girls Systolic BP Percentile      Girls Diastolic BP Percentile      Boys Systolic BP Percentile      Boys Diastolic BP Percentile      Pulse Rate 88     Resp 20     Temp 98.9 F (37.2 C)     Temp Source Axillary     SpO2 98 %     Weight      Height      Head Circumference      Peak Flow      Pain Score      Pain Loc      Pain Education      Exclude from Growth Chart     Most recent vital signs: Vitals:   01/28/24 0230 01/28/24 0300  BP: (!) 161/72 (!) 179/116  Pulse: 92 (!) 101  Resp: 15 18  Temp:    SpO2: 99% 99%    General: Awake, alert, no obvious distress, tries to communicate and answer some simple questions. CV:  Good peripheral perfusion.  Resp:  Normal effort. Speaking easily and comfortably, no accessory muscle usage nor intercostal retractions.   Abd:  Obese, soft, nontender.  Narrow percutaneous stoma is present and well-appearing, but it is not patent for a PEG tube (see note below).   ED Results / Procedures / Treatments   Labs (all labs ordered are listed, but only abnormal results are displayed) Labs Reviewed  CBC WITH DIFFERENTIAL/PLATELET - Abnormal; Notable for the  following components:      Result Value   RBC 5.25 (*)    Hemoglobin 16.1 (*)    HCT 48.4 (*)    All other components within normal limits  BASIC METABOLIC PANEL WITH GFR      PROCEDURES:  Critical Care performed: No  .Gastrostomy tube replacement  Date/Time: 01/28/2024 2:15 AM  Performed by: Gordan Huxley, MD Authorized by: Gordan Huxley, MD  Consent: The procedure was performed in an emergent situation. Verbal consent obtained Risks and benefits: risks, benefits and alternatives were discussed Consent given by: patient Patient identity confirmed: arm band Preparation: Patient was prepped and draped in the usual sterile fashion. Local anesthesia used: no  Anesthesia: Local anesthesia used: no  Sedation: Patient sedated: no  Patient tolerance: patient tolerated the procedure well with no immediate complications Comments: Unfortunately the new PEG tube could not pass through the stoma (see ED course for additional details).       IMPRESSION / MDM / ASSESSMENT AND PLAN / ED COURSE  I reviewed the triage vital signs and the nursing notes.  Differential diagnosis includes, but is not limited to, PEG tube displacement, closure of stoma.  [name] is most consistent with acute presentation with potential threat to life or bodily function.  Labs/studies ordered: BMP, CBC  Interventions/Medications given:  Medications  0.9 %  sodium chloride  infusion ( Intravenous New Bag/Given 01/28/24 0312)    (Note:  hospital course my include additional interventions and/or labs/studies not listed above.)   I reviewed the medical record and see multiple times where she has had issues with PEG dysfunction or dislodgment.  I see that it was placed as recently as November 2024 by interventional radiology and confirmed the size of 18 [place].  Unfortunately, when I tried to replace it, the stoma is not nearly patent enough to allow the  passage of the PEG tube.  In fact, I tried to gently press a sterile Q-tip through the stoma to verify the passage, and not even that would pass through.  It caused the patient some discomfort as well.  Given that I do not want to cause additional injury, the patient will likely need interventional radiology to replace the tube.  I will discuss it with them.  Patient nods her head in agreement when I explained the plan.     Clinical Course as of 01/28/24 0545  Sat Jan 28, 2024  0235 Paged IR to discuss [CF]  79 Consulted by phone with Dr. Juliene Balder who is on-call for interventional radiology.  He confirmed that someone will be able to do the procedure but he does not know at what time later today.  Under the circumstances, the safest thing for the patient is to admit her to the hospital service.  I will check some basic labs and provide an IV fluid infusion but keep her n.p.o. in the meantime. [CF]  0358 Labs within normal limits, consulting hospitalist service [CF]  [phone] I consulted by phone with the admitting hospitalist, and they will admit the patient - Dr. Franky.  I told him about my conversation with Dr. Balder, and Dr. Franky will place the appropriate IR consult/procedure order.  [CF]    Clinical Course User Index [CF] Gordan Huxley, MD     FINAL CLINICAL IMPRESSION(S) / ED DIAGNOSES   Final diagnoses:  Dislodged gastrostomy tube  Status post CVA  Dysarthria  Type 2 diabetes mellitus without complication, unspecified whether long term insulin  use (HCC)     Rx / DC Orders   ED Discharge Orders     None        Note:  This document was prepared using Dragon voice recognition software and may include unintentional dictation errors.   Gordan Huxley, MD 01/28/24 9453    Gordan Huxley, MD 01/28/24 [phone]

## 2024-01-28 NOTE — Plan of Care (Signed)
  Problem: Clinical Measurements: Goal: Will remain free from infection Outcome: Progressing   Problem: Coping: Goal: Level of anxiety will decrease Outcome: Progressing   Problem: Elimination: Goal: Will not experience complications related to urinary retention Outcome: Progressing   Problem: Pain Managment: Goal: General experience of comfort will improve and/or be controlled Outcome: Progressing   Problem: Safety: Goal: Ability to remain free from injury will improve Outcome: Progressing

## 2024-01-29 DIAGNOSIS — T85528A Displacement of other gastrointestinal prosthetic devices, implants and grafts, initial encounter: Secondary | ICD-10-CM | POA: Diagnosis not present

## 2024-01-29 DIAGNOSIS — K9423 Gastrostomy malfunction: Secondary | ICD-10-CM | POA: Diagnosis not present

## 2024-01-29 LAB — BASIC METABOLIC PANEL WITH GFR
Anion gap: 10 (ref 5–15)
BUN: 13 mg/dL (ref 8–23)
CO2: 28 mmol/L (ref 22–32)
Calcium: 8.9 mg/dL (ref 8.9–10.3)
Chloride: 103 mmol/L (ref 98–111)
Creatinine, Ser: 0.64 mg/dL (ref 0.44–1.00)
GFR, Estimated: >60 mL/min (ref >60)
Glucose, Bld: 71 mg/dL (ref 70–99)
Potassium: 3.7 mmol/L (ref 3.5–5.1)
Sodium: 141 mmol/L (ref 135–145)

## 2024-01-29 LAB — HEMOGLOBIN A1C
Hgb A1c MFr Bld: 5.7 % — ABNORMAL HIGH (ref 4.8–5.6)
Mean Plasma Glucose: 117 mg/dL

## 2024-01-29 LAB — GLUCOSE, CAPILLARY
Glucose-Capillary: 74 mg/dL (ref 70–99)
Glucose-Capillary: 128 mg/dL — ABNORMAL HIGH (ref 70–99)
Glucose-Capillary: 68 mg/dL — ABNORMAL LOW (ref 70–99)
Glucose-Capillary: 103 mg/dL — ABNORMAL HIGH (ref 70–99)
Glucose-Capillary: 104 mg/dL — ABNORMAL HIGH (ref 70–99)

## 2024-01-29 MED ORDER — DEXTROSE 50 % IV SOLN
12.5000 g | INTRAVENOUS | Status: AC
  Administered 2024-01-29: 12.5 g via INTRAVENOUS
  Filled 2024-01-29: qty 50

## 2024-01-29 MED ORDER — JEVITY 1.5 CAL/FIBER PO LIQD: 1000.0000 mL | ORAL | Status: DC

## 2024-01-29 MED ORDER — GLUCERNA 1.5 CAL PO LIQD: 1000.0000 mL | ORAL | 1 refills | Status: AC

## 2024-01-29 MED ORDER — FREE WATER
200.0000 mL | Freq: Four times a day (QID) | Status: DC
  Administered 2024-01-29 (×2): 200 mL

## 2024-01-29 MED ORDER — GLUCERNA 1.5 CAL PO LIQD: 1000.0000 mL | ORAL | Status: DC

## 2024-01-29 MED ORDER — PROSOURCE TF20 ENFIT COMPATIBL EN LIQD
60.0000 mL | Freq: Every day | ENTERAL | Status: DC
  Administered 2024-01-29: 60 mL
  Filled 2024-01-29: qty 60

## 2024-01-29 MED ORDER — GLUCERNA 1.5 CAL PO LIQD
1000.0000 mL | ORAL | Status: DC
  Administered 2024-01-29: 1000 mL

## 2024-01-29 MED ORDER — FREE WATER
200.0000 mL | Freq: Four times a day (QID) | 0 refills | Status: AC
  Filled 2024-01-29: qty 24000, 30d supply, fill #0

## 2024-01-29 NOTE — TOC Transition Note (Signed)
 Transition of Care [facility]) - Discharge Note   Patient Details  Name: Kaitlyn Ruiz MRN: [id number] Date of Birth: July 07, 1955  [facility] CM/SW Contact:  Marinda Cooks, RN Phone Number: 01/29/2024, 2:29 PM   Clinical Narrative:    This CM updated by covering MD pt medically cleared to dc today and has active DC order . This CM confirmed with Admission Nurse pt can return back to  [facility]. DC transportation confirmed for pt with [facility] team updated . No additional DC needs requested by medical team or identified by CM at this time .     Final next level of care: Skilled Nursing Facility Barriers to Discharge: No Barriers Identified        Social Drivers of Health (SDOH) Interventions SDOH Screenings   Food Insecurity: Patient Unable To Answer (01/28/2024)  Housing: Patient Unable To Answer (01/28/2024)  Transportation Needs: Patient Unable To Answer (01/28/2024)  Utilities: Patient Unable To Answer (01/28/2024)  Social Connections: Unknown (01/28/2024)  Tobacco Use: Medium Risk (01/28/2024)     Readmission Risk Interventions    08/14/2021   10:11 AM  Readmission Risk Prevention Plan  Transportation Screening Complete  PCP or Specialist Appt within 3-5 Days Complete  HRI or Home Care Consult Complete  Social Work Consult for Recovery Care Planning/Counseling Complete  Medication Review Oceanographer) Complete

## 2024-01-29 NOTE — Plan of Care (Signed)
  Problem: Clinical Measurements: Goal: Will remain free from infection Outcome: Progressing   Problem: Nutrition: Goal: Adequate nutrition will be maintained Outcome: Progressing   Problem: Coping: Goal: Level of anxiety will decrease Outcome: Progressing   Problem: Pain Managment: Goal: General experience of comfort will improve and/or be controlled Outcome: Progressing   Problem: Safety: Goal: Ability to remain free from injury will improve Outcome: Progressing

## 2024-01-29 NOTE — Discharge Summary (Signed)
 Physician Discharge Summary   Patient: Kaitlyn Ruiz MRN: [id number] DOB: October 30, 1955  Admit date:     01/27/2024  Discharge date: 01/29/24  Discharge Physician: Even Budlong   PCP: Pcp, No   Recommendations at discharge:   Administered Meds, nutrition and free water  through PEG Check blood sugars every 6 hours Return to the emergency room if further issues with PEG tubes  Discharge Diagnoses: Principal Problem:   Dislodged gastrostomy tube Active Problems:   HTN (hypertension)   History of CVA (cerebrovascular accident)   COPD with chronic bronchitis (HCC)   Type 2 diabetes mellitus with peripheral neuropathy (HCC)   Dysphagia as late effect of cerebrovascular accident (CVA)   Depression   Epilepsy (HCC)   ICH (intracerebral hemorrhage) (HCC)  Resolved Problems:   * No resolved hospital problems. *  Hospital Course:  Kaitlyn Ruiz is a 68 y.o. female with history of CVA who has chronic G-tube in place was referred to ER after the gastrostomy tube was dislodged.  Patient also has history of diabetes COPD depression seizures.  Patient is nonverbal and does not provide history.  Unable to reach patient's sister who is patient's next of kin mentioned in the chart to get more history.   ED Course: In the ER ER physician tried to place the G-tube bag but was unable to.  Interventional radiology was consulted patient admitted for further management.  Blood pressure remained high while in the ER.  Was given IV hydralazine .  Labs show hemoglobin 16.1 potassium 3.8 creatinine 0.5 blood glucose of 70.      Assessment and Plan:  Dislodged gastrostomy tube  Appreciate IR input PEG tube replaced and functional   Diabetes mellitus type 2  Continue Glucerna via PEG tube Continue sliding scale coverage Hemoglobin A1c is 5.7 Check blood sugars every 6 hours  Hypertension uncontrolled  Continue metoprolol .  History of stroke  Continue statin and aspirin .  History of  seizures  Continue Vimpat  and Keppra .  Seizure precautions  COPD  Continue as needed bronchodilator therapy  Depression  Continue citalopram .       Consultants: Interventional radiology Procedures performed: PEG tube replacement Disposition: Skilled nursing facility Diet recommendation:  NPO   DISCHARGE MEDICATION: Allergies as of 01/29/2024   No Known Allergies      Medication List     TAKE these medications    Acetaminophen  325 MG/10.15ML Soln Give 325 mg by tube every 8 (eight) hours.   ACIDOPHILUS PO Take 1 tablet by mouth in the morning, at noon, and at bedtime.   albuterol  108 (90 Base) MCG/ACT inhaler Commonly known as: VENTOLIN  HFA Inhale 2 puffs into the lungs every 4 (four) hours as needed for wheezing or shortness of breath.   ammonium lactate  12 % lotion Commonly known as: LAC-HYDRIN  Apply 1 Application topically daily. Apply small amount to bilateral feet   aspirin  81 MG chewable tablet Place 81 mg into feeding tube daily.   citalopram  10 MG tablet Commonly known as: CELEXA  Place 10 mg into feeding tube daily.   Dorzolamide  HCl-Timolol  Mal PF 2-0.5 % Soln Apply 1 drop to eye every 12 (twelve) hours.   feeding supplement (PROSource TF) liquid Place 45 mLs into feeding tube daily. What changed:  how much to take when to take this   feeding supplement (GLUCERNA 1.5 CAL) Liqd Place 1,000 mLs into feeding tube continuous. What changed: You were already taking a medication with the same name, and this prescription was added. Make sure you  understand how and when to take each.   fludrocortisone  0.1 MG tablet Commonly known as: FLORINEF  Place 0.1 mg into feeding tube daily.   free water  Soln Place 200 mLs into feeding tube every 6 (six) hours.   gabapentin  250 MG/5ML solution Commonly known as: NEURONTIN  Place 6 mLs (300 mg total) into feeding tube every 8 (eight) hours.   guaiFENesin  100 MG/5ML liquid Commonly known as: ROBITUSSIN Take  5 mLs by mouth every 4 (four) hours as needed for cough or to loosen phlegm.   ipratropium-albuterol  0.5-2.5 (3) MG/3ML Soln Commonly known as: DUONEB Take 3 mLs by nebulization 2 (two) times daily as needed.   lacosamide  10 MG/ML oral solution Commonly known as: VIMPAT  Take 15 mLs (150 mg total) by mouth 2 (two) times daily.   levETIRAcetam  100 MG/ML solution Commonly known as: KEPPRA  Place 15 mLs into feeding tube 2 (two) times daily.   liver oil-zinc  oxide 40 % ointment Commonly known as: DESITIN Apply topically as needed for irritation. What changed:  how much to take when to take this additional instructions   loperamide  HCl 2 MG/15ML solution Commonly known as: IMODIUM  Place 15 mLs (2 mg total) into feeding tube 4 (four) times daily as needed for diarrhea or loose stools.   metoprolol  tartrate 50 MG tablet Commonly known as: LOPRESSOR  Place 1 tablet (50 mg total) into feeding tube 2 (two) times daily.   mouth rinse Liqd solution 15 mLs by Mouth Rinse route 2 times daily at 12 noon and 4 pm.   NovoLOG  FlexPen 100 UNIT/ML FlexPen Generic drug: insulin  aspart Inject 2-14 Units into the skin in the morning and at bedtime. Per sliding scale, if blood sugar is: 201 - 250 = 2 units 251 - 300 = 4 units 301 - 350 = 6 units 351 - 400 = 8 units 401- 450 = 10 units 451- 500 = 12 units 500 or above = 14 units If blood sugar is >400 or <80 call PEC Triage.   nystatin  powder Apply 1 Application topically 3 (three) times daily.   polyethylene glycol 17 g packet Commonly known as: MIRALAX  / GLYCOLAX  Place 17 g into feeding tube daily as needed for moderate constipation.   pravastatin  20 MG tablet Commonly known as: PRAVACHOL  Place 20 mg into feeding tube at bedtime.   Pro-Stat AWC Liqd Take 30 mLs by mouth in the morning and at bedtime.   scopolamine  1 MG/3DAYS Commonly known as: TRANSDERM-SCOP Place 1 patch onto the skin every 3 (three) days. (Apply behind ear)    sennosides 8.8 MG/5ML syrup Commonly known as: SENOKOT Place 5 mLs into feeding tube daily.   Valproic  Acid 500 MG/10ML Soln 2.5 mLs by Feeding Tube route in the morning and at bedtime.   White Petroleum Jelly Gel Apply 1 Application topically 3 (three) times daily. (Apply to lips)        Discharge Exam: There were no vitals filed for this visit.  Eyes: Anicteric no pallor. ENMT: No discharge from the ears eyes nose or mouth. Neck: No mass felt.  No neck rigidity. Respiratory: No rhonchi or crepitations. Cardiovascular: S1-S2 heard. Abdomen: Soft nontender bowel sound present. Musculoskeletal: No edema. Skin: No obvious rash or ulcers seen. Neurologic: Alert awake follows commands but has general weakness from previous stroke. Psychiatric: Patient is nonverbal.    Condition at discharge: stable  The results of significant diagnostics from this hospitalization (including imaging, microbiology, ancillary and laboratory) are listed below for reference.   Imaging Studies:  IR Replc Gastro/Colonic Tube Percut W/Fluoro Result Date: 01/28/2024 INDICATION: Dislodged gastrostomy tube. EXAM: GASTROSTOMY TUBE REPLACEMENT WITH FLUOROSCOPY MEDICATIONS: None ANESTHESIA/SEDATION: None CONTRAST:  8 mL Omnipaque  300-administered into the gastric lumen. FLUOROSCOPY: Radiation Exposure Index (as provided by the fluoroscopic device): 1 mGy Kerma COMPLICATIONS: None immediate. PROCEDURE: Informed consent was obtained from the patient's sister after a thorough discussion of the procedural risks, benefits and alternatives. All questions were addressed. Maximal Sterile Barrier Technique was utilized including caps, mask, sterile gowns, sterile gloves, sterile drape, hand hygiene and skin antiseptic. A timeout was performed prior to the initiation of the procedure. Old catheter was completely dislodged. Kumpe catheter was advanced through the tube tract. Contrast injection confirmed placement in the  stomach. Super stiff Amplatz wire was placed. The tract was dilated up to 20 [place]. An 43 French balloon retention gastrostomy tube was advanced over the wire. The balloon was inflated with 10 mL of sterile water . Contrast injection confirmed placement in the stomach. Gastrostomy tube was flushed with saline. Fluoroscopic images were taken and saved for this procedure. IMPRESSION: Successful replacement of the gastrostomy tube using fluoroscopy. Electronically Signed   By: Juliene Balder M.D.   On: 01/28/2024 12:15    Microbiology: Results for orders placed or performed during the hospital encounter of 04/06/23  Resp panel by RT-PCR (RSV, Flu A&B, Covid) Anterior Nasal Swab     Status: None   Collection Time: 04/12/23  7:01 AM   Specimen: Anterior Nasal Swab  Result Value Ref Range Status   SARS Coronavirus 2 by RT PCR NEGATIVE NEGATIVE Final   Influenza A by PCR NEGATIVE NEGATIVE Final   Influenza B by PCR NEGATIVE NEGATIVE Final    Comment: (NOTE) The Xpert Xpress SARS-CoV-2/FLU/RSV plus assay is intended as an aid in the diagnosis of influenza from Nasopharyngeal swab specimens and should not be used as a sole basis for treatment. Nasal washings and aspirates are unacceptable for Xpert Xpress SARS-CoV-2/FLU/RSV testing.  Fact Sheet for Patients: BloggerCourse.com  Fact Sheet for Healthcare Providers: SeriousBroker.it  This test is not yet approved or cleared by the [place]  FDA and has been authorized for detection and/or diagnosis of SARS-CoV-2 by FDA under an Emergency Use Authorization (EUA). This EUA will remain in effect (meaning this test can be used) for the duration of the COVID-19 declaration under Section 564(b)(1) of the Act, 21 U.S.C. section 360bbb-3(b)(1), unless the authorization is terminated or revoked.     Resp Syncytial Virus by PCR NEGATIVE NEGATIVE Final    Comment: (NOTE) Fact Sheet for  Patients: BloggerCourse.com  Fact Sheet for Healthcare Providers: SeriousBroker.it  This test is not yet approved or cleared by the [place]  FDA and has been authorized for detection and/or diagnosis of SARS-CoV-2 by FDA under an Emergency Use Authorization (EUA). This EUA will remain in effect (meaning this test can be used) for the duration of the COVID-19 declaration under Section 564(b)(1) of the Act, 21 U.S.C. section 360bbb-3(b)(1), unless the authorization is terminated or revoked.  Performed at [facility], [address], [place], [place] [address]     Labs: CBC: Recent Labs  Lab 01/28/24 [id number] 01/28/24 0635  WBC 9.6 9.7  NEUTROABS 5.2  --   HGB 16.1* 15.5*  HCT 48.4* 46.3*  MCV 92.2 90.3  PLT 162 176   Basic Metabolic Panel: Recent Labs  Lab 01/28/24 0307 01/28/24 0635 01/29/24 0916  NA 138 141 141  K 3.8 3.2* 3.7  CL 99 102 103  CO2 28 28 28   GLUCOSE 70 77 71  BUN 16 14 13   CREATININE 0.55 0.65 0.64  CALCIUM 9.0 9.0 8.9   Liver Function Tests: No results for input(s): AST, ALT, ALKPHOS, BILITOT, PROT, ALBUMIN  in the last 168 hours. CBG: Recent Labs  Lab 01/28/24 2044 01/29/24 0836 01/29/24 0933 01/29/24 1009 01/29/24 1208  GLUCAP 87 74 68* 128* 103*    Discharge time spent: greater than 30 minutes.  Signed: Aimee Somerset, MD [facility] 01/29/2024

## 2024-01-29 NOTE — Progress Notes (Signed)
 Initial Nutrition Assessment  DOCUMENTATION CODES:   Not applicable  INTERVENTION:  TF via PEG: Once Glucerna 1.5 infusion complete, transition to Jevity 1.5 at 58ml/hr ( per day) 60ml ProSource TF20 once daily Provides 1700 kcal, 89g protein, free water  Free water  flush 200ml q6h per MD Request updated measured weight  NUTRITION DIAGNOSIS:  Inadequate oral intake related to chronic illness, dysphagia as evidenced by NPO status.  GOAL:  Patient will meet greater than or equal to 90% of their needs  MONITOR:  Labs, Weight trends, TF tolerance  REASON FOR ASSESSMENT:  Consult Enteral/tube feeding initiation and management  ASSESSMENT:  Pt admitted from SNF d/t dislodged PEG tube. PMH significant for chronic G-tube, DM, COPD, depression, seizures, non-verbal.  8/30 - gastrostomy tube replaced in IR  RD working remotely. Unable to obtain detailed nutrition related history at this time.   Pt was last assessed by [facility] inpatient RD team in November 2024. At that time pt was consuming some pureed and ground foods for pleasure though has primarily been reliant on tube feeding via PEG.   Unfortunately, there is no tube feeding regimen listed in home meds to assess pt's baseline tube feeding regimen and per RN, only prosource is listed in the paper chart. Per notes in 2024, she was receiving IsoSource as outpatient.   Glucerna 1.5 initiated this morning however given A1c reflects adequate blood sugar control, will transition to Jevity 1.5 tomorrow once Glucerna bottle is complete. Discussed with RN.  Unfortunately, there is no further updated weight history on file since 04/2023. Will place order for updated weight to assess adequacy of nutrition support regimen.   Medications: SSI 0-9 units q4h  Labs:  CBG's 68-128 x24 hours HgbA1c 5.7%  NUTRITION - FOCUSED PHYSICAL EXAM: RD working remotely. Deferred to follow up.   Diet Order:   Diet Order              Diet NPO time specified  Diet effective now                   EDUCATION NEEDS:   No education needs have been identified at this time  Skin:  Skin Assessment: Reviewed RN Assessment  Last BM:  unknown/PTA  Height:   Ht Readings from Last 1 Encounters:  04/15/23 5' 5 (1.651 m)    Weight:   Wt Readings from Last 1 Encounters:  04/15/23 78.7 kg    Ideal Body Weight:  56.8 kg  BMI:  There is no height or weight on file to calculate BMI.  Estimated Nutritional Needs:   Kcal:  1600-1800  Protein:  80-95g  Fluid:  >/=1.6L  Allie Jerl Munyan, RDN, LDN Clinical Nutrition See AMiON for contact information.

## 2024-01-30 ENCOUNTER — Other Ambulatory Visit: Payer: Self-pay

## 2024-06-06 ENCOUNTER — Encounter: Payer: Self-pay | Admitting: Emergency Medicine

## 2024-06-06 ENCOUNTER — Other Ambulatory Visit: Payer: Self-pay

## 2024-06-06 ENCOUNTER — Emergency Department

## 2024-06-06 ENCOUNTER — Emergency Department
Admission: EM | Admit: 2024-06-06 | Discharge: 2024-06-07 | Disposition: A | Discharge status: Skilled Nursing Facility | Attending: Emergency Medicine | Admitting: Emergency Medicine

## 2024-06-06 DIAGNOSIS — K9420 Gastrostomy complication, unspecified: Secondary | ICD-10-CM | POA: Insufficient documentation

## 2024-06-06 MED ORDER — DIATRIZOATE MEGLUMINE & SODIUM 66-10 % PO SOLN
30.0000 mL | Freq: Once | ORAL | Status: AC
  Administered 2024-06-06: 30 mL

## 2024-06-06 MED ORDER — DIATRIZOATE MEGLUMINE & SODIUM 66-10 % PO SOLN: 50.0000 mL | Freq: Once | ORAL | Status: DC

## 2024-06-06 NOTE — ED Notes (Signed)
 This RN attempted to call report to [employer], unable to reach patient's nurse for report at this time.

## 2024-06-06 NOTE — ED Notes (Signed)
 New  63fr gtube placed by dr floy without diff.  Pt tolerated well.

## 2024-06-06 NOTE — ED Provider Notes (Signed)
 "  [facility] Provider Note    Event Date/Time   First MD Initiated Contact with Patient 06/06/24 2149     (approximate)   History   GI Problem   HPI  Kaitlyn Ruiz is a 69 y.o. female who presents to the emergency department today after accidentally dislodging her GJ tube.  Staff at the living facility did place a Foley catheter in the tract.     Physical Exam   Triage Vital Signs: ED Triage Vitals  Encounter Vitals Group     BP 06/06/24 2158 (!) 165/70     Girls Systolic BP Percentile --      Girls Diastolic BP Percentile --      Boys Systolic BP Percentile --      Boys Diastolic BP Percentile --      Pulse Rate 06/06/24 2156 (!) 56     Resp 06/06/24 2156 19     Temp 06/06/24 2156 97.9 F (36.6 C)     Temp Source 06/06/24 2156 Axillary     SpO2 06/06/24 2156 98 %     Weight --      Height --      Head Circumference --      Peak Flow --      Pain Score --      Pain Loc --      Pain Education --      Exclude from Growth Chart --     Most recent vital signs: Vitals:   06/06/24 2156 06/06/24 2158  BP:  (!) 165/70  Pulse: (!) 56   Resp: 19   Temp: 97.9 F (36.6 C)   SpO2: 98%    General: Awake, alert, non verbal. CV:  Good peripheral perfusion.  Resp:  Normal effort.  Abd:  No distention. Foley catheter in G tube tract. Other:     ED Results / Procedures / Treatments   Labs (all labs ordered are listed, but only abnormal results are displayed) Labs Reviewed - No data to display   EKG  None   RADIOLOGY I independently interpreted and visualized the abd x-ray. My interpretation: appropriate placement  PROCEDURES:  Critical Care performed: No  Gastrostomy tube replacement Performed by: Guadalupe Eagles Required items: required blood products, implants, devices, and special equipment available Patient identity confirmed: hospital-assigned identification number Time out: Immediately prior to procedure a time out  was called to verify the correct patient, procedure, equipment, support staff and site/side marked as required. Preparation: Patient was prepped and draped in the usual sterile fashion. Patient tolerance: Patient tolerated the procedure well with no immediate complications.  Comments: 18 french Gastrostomy tube placed without difficulty. X-ray shows appropriate positioning.    MEDICATIONS ORDERED IN ED: Medications  diatrizoate  meglumine -sodium (GASTROGRAFIN ) 66-10 % solution 50 mL (has no administration in time range)     IMPRESSION / MDM / ASSESSMENT AND PLAN / ED COURSE  I reviewed the triage vital signs and the nursing notes.                              Patient presented to the emergency department today because her G-tube had become dislodged by accident.  Foley catheter had been placed by staff at living facility.  This was removed and an 34 French G-tube was able to be placed without any difficulty.  X-ray confirms placement.  Will discharge back to facility.      FINAL  CLINICAL IMPRESSION(S) / ED DIAGNOSES   Final diagnoses:  Complication of feeding tube [facility])      Note:  This document was prepared using Dragon voice recognition software and may include unintentional dictation errors.    Floy Roberts, MD 06/06/24 2302  "

## 2024-06-06 NOTE — ED Triage Notes (Signed)
 Pt arrived via [facility] from [facility] after her G-Tube was dislodged. EMS concern in route due to heart rate 40-60bpm.   ** Previous G-Tube Size 18
